# Patient Record
Sex: Female | Born: 1935 | Race: White | Hispanic: No | Smoking: Never smoker
Health system: Southern US, Community
[De-identification: ages and names within clinical notes are randomized; demographics above are authoritative.]

## PROBLEM LIST (undated history)

## (undated) DIAGNOSIS — C182 Malignant neoplasm of ascending colon: Secondary | ICD-10-CM

## (undated) DIAGNOSIS — I10 Essential (primary) hypertension: Secondary | ICD-10-CM

## (undated) DIAGNOSIS — Z853 Personal history of malignant neoplasm of breast: Secondary | ICD-10-CM

## (undated) DIAGNOSIS — E039 Hypothyroidism, unspecified: Secondary | ICD-10-CM

## (undated) HISTORY — PX: RIGHT COLECTOMY: SHX853

## (undated) HISTORY — PX: BREAST EXCISIONAL BIOPSY: SUR124

## (undated) HISTORY — PX: MASTECTOMY: SHX3

---

## 2011-04-12 DIAGNOSIS — L03039 Cellulitis of unspecified toe: Secondary | ICD-10-CM | POA: Diagnosis not present

## 2011-06-28 DIAGNOSIS — E039 Hypothyroidism, unspecified: Secondary | ICD-10-CM | POA: Diagnosis not present

## 2011-06-28 DIAGNOSIS — E7800 Pure hypercholesterolemia, unspecified: Secondary | ICD-10-CM | POA: Diagnosis not present

## 2011-06-28 DIAGNOSIS — Z79899 Other long term (current) drug therapy: Secondary | ICD-10-CM | POA: Diagnosis not present

## 2011-07-05 DIAGNOSIS — E7800 Pure hypercholesterolemia, unspecified: Secondary | ICD-10-CM | POA: Diagnosis not present

## 2011-07-05 DIAGNOSIS — Z0000 Encounter for general adult medical examination without abnormal findings: Secondary | ICD-10-CM | POA: Diagnosis not present

## 2011-07-05 DIAGNOSIS — E039 Hypothyroidism, unspecified: Secondary | ICD-10-CM | POA: Diagnosis not present

## 2011-08-15 DIAGNOSIS — H353190 Nonexudative age-related macular degeneration, unspecified eye, stage unspecified: Secondary | ICD-10-CM | POA: Diagnosis not present

## 2011-10-12 DIAGNOSIS — Z1231 Encounter for screening mammogram for malignant neoplasm of breast: Secondary | ICD-10-CM | POA: Diagnosis not present

## 2011-10-12 DIAGNOSIS — Z9010 Acquired absence of unspecified breast and nipple: Secondary | ICD-10-CM | POA: Diagnosis not present

## 2011-10-15 DIAGNOSIS — M719 Bursopathy, unspecified: Secondary | ICD-10-CM | POA: Diagnosis not present

## 2012-01-08 DIAGNOSIS — E039 Hypothyroidism, unspecified: Secondary | ICD-10-CM | POA: Diagnosis not present

## 2012-01-08 DIAGNOSIS — E7800 Pure hypercholesterolemia, unspecified: Secondary | ICD-10-CM | POA: Diagnosis not present

## 2012-01-08 DIAGNOSIS — Z23 Encounter for immunization: Secondary | ICD-10-CM | POA: Diagnosis not present

## 2012-02-15 DIAGNOSIS — E039 Hypothyroidism, unspecified: Secondary | ICD-10-CM | POA: Diagnosis not present

## 2012-02-15 DIAGNOSIS — I10 Essential (primary) hypertension: Secondary | ICD-10-CM | POA: Diagnosis not present

## 2012-02-15 DIAGNOSIS — Z79899 Other long term (current) drug therapy: Secondary | ICD-10-CM | POA: Diagnosis not present

## 2012-02-15 DIAGNOSIS — E785 Hyperlipidemia, unspecified: Secondary | ICD-10-CM | POA: Diagnosis not present

## 2012-10-07 DIAGNOSIS — E7800 Pure hypercholesterolemia, unspecified: Secondary | ICD-10-CM | POA: Insufficient documentation

## 2012-10-09 DIAGNOSIS — E559 Vitamin D deficiency, unspecified: Secondary | ICD-10-CM | POA: Diagnosis not present

## 2012-10-09 DIAGNOSIS — E7800 Pure hypercholesterolemia, unspecified: Secondary | ICD-10-CM | POA: Diagnosis not present

## 2012-10-09 DIAGNOSIS — E039 Hypothyroidism, unspecified: Secondary | ICD-10-CM | POA: Diagnosis not present

## 2012-10-09 DIAGNOSIS — I10 Essential (primary) hypertension: Secondary | ICD-10-CM | POA: Diagnosis not present

## 2012-10-15 DIAGNOSIS — Z1231 Encounter for screening mammogram for malignant neoplasm of breast: Secondary | ICD-10-CM | POA: Diagnosis not present

## 2012-10-16 DIAGNOSIS — Z23 Encounter for immunization: Secondary | ICD-10-CM | POA: Diagnosis not present

## 2012-10-16 DIAGNOSIS — E7800 Pure hypercholesterolemia, unspecified: Secondary | ICD-10-CM | POA: Diagnosis not present

## 2012-10-16 DIAGNOSIS — I10 Essential (primary) hypertension: Secondary | ICD-10-CM | POA: Diagnosis not present

## 2012-10-16 DIAGNOSIS — E559 Vitamin D deficiency, unspecified: Secondary | ICD-10-CM | POA: Diagnosis not present

## 2012-10-16 DIAGNOSIS — E538 Deficiency of other specified B group vitamins: Secondary | ICD-10-CM | POA: Diagnosis not present

## 2012-10-30 ENCOUNTER — Encounter (INDEPENDENT_AMBULATORY_CARE_PROVIDER_SITE_OTHER): Payer: Self-pay | Admitting: *Deleted

## 2012-11-12 ENCOUNTER — Telehealth (INDEPENDENT_AMBULATORY_CARE_PROVIDER_SITE_OTHER): Payer: Self-pay | Admitting: *Deleted

## 2012-11-12 ENCOUNTER — Encounter (INDEPENDENT_AMBULATORY_CARE_PROVIDER_SITE_OTHER): Payer: Self-pay | Admitting: *Deleted

## 2012-11-12 ENCOUNTER — Other Ambulatory Visit (INDEPENDENT_AMBULATORY_CARE_PROVIDER_SITE_OTHER): Payer: Self-pay | Admitting: *Deleted

## 2012-11-12 DIAGNOSIS — Z85038 Personal history of other malignant neoplasm of large intestine: Secondary | ICD-10-CM

## 2012-11-12 DIAGNOSIS — Z1211 Encounter for screening for malignant neoplasm of colon: Secondary | ICD-10-CM

## 2012-11-12 NOTE — Telephone Encounter (Signed)
Patient needs movi prep 

## 2012-11-13 ENCOUNTER — Telehealth (INDEPENDENT_AMBULATORY_CARE_PROVIDER_SITE_OTHER): Payer: Self-pay | Admitting: *Deleted

## 2012-11-13 ENCOUNTER — Encounter (INDEPENDENT_AMBULATORY_CARE_PROVIDER_SITE_OTHER): Payer: Self-pay

## 2012-11-13 NOTE — Telephone Encounter (Signed)
  Procedure: tcs  Reason/Indication:  Hx colon ca  Ascending colon removed  Has patient had this procedure before?  Yes, 2009 (scanned)  If so, when, by whom and where?    Is there a family history of colon cancer?  no  Who?  What age when diagnosed?    Is patient diabetic?   no      Does patient have prosthetic heart valve?  no  Do you have a pacemaker?  no  Has patient ever had endocarditis? no  Has patient had joint replacement within last 12 months?  no  Is patient on Coumadin, Plavix and/or Aspirin? no  Medications: synthroid 88 mcg daily, lisinopril 12.5 mg daily, preser-vision daily, vit b 12 daily, vit d3 bid  Allergies: nkda  Medication Adjustment:   Procedure date & time: 12/11/12 at 1030

## 2012-11-13 NOTE — Telephone Encounter (Signed)
Need to know if this patient is allergic to anything.   FYI: I talked with a pharmacist yesterday. He said if a patient is allergic to sulfa, they should not be drinking Moviprep. They can have a serious reaction.

## 2012-11-13 NOTE — Telephone Encounter (Signed)
agree

## 2012-11-14 MED ORDER — PEG-KCL-NACL-NASULF-NA ASC-C 100 G PO SOLR: 1.0000 | Freq: Once | ORAL | Status: DC

## 2012-11-14 NOTE — Telephone Encounter (Signed)
NKDA

## 2012-11-28 ENCOUNTER — Encounter (HOSPITAL_COMMUNITY): Payer: Self-pay | Admitting: Pharmacy Technician

## 2012-11-28 DIAGNOSIS — H353190 Nonexudative age-related macular degeneration, unspecified eye, stage unspecified: Secondary | ICD-10-CM | POA: Diagnosis not present

## 2012-12-11 ENCOUNTER — Encounter (HOSPITAL_COMMUNITY): Payer: Self-pay | Admitting: *Deleted

## 2012-12-11 ENCOUNTER — Ambulatory Visit (HOSPITAL_COMMUNITY)
Admission: RE | Admit: 2012-12-11 | Discharge: 2012-12-11 | Disposition: A | Discharge status: Home / Self Care | Payer: Medicare Other | Source: Ambulatory Visit | Attending: Internal Medicine | Admitting: Internal Medicine

## 2012-12-11 ENCOUNTER — Encounter (HOSPITAL_COMMUNITY)
Admission: RE | Disposition: A | Discharge status: Home / Self Care | Payer: Self-pay | Source: Ambulatory Visit | Attending: Internal Medicine

## 2012-12-11 DIAGNOSIS — Z9049 Acquired absence of other specified parts of digestive tract: Secondary | ICD-10-CM

## 2012-12-11 DIAGNOSIS — D126 Benign neoplasm of colon, unspecified: Secondary | ICD-10-CM | POA: Diagnosis not present

## 2012-12-11 DIAGNOSIS — K644 Residual hemorrhoidal skin tags: Secondary | ICD-10-CM | POA: Insufficient documentation

## 2012-12-11 DIAGNOSIS — Z85038 Personal history of other malignant neoplasm of large intestine: Secondary | ICD-10-CM | POA: Diagnosis not present

## 2012-12-11 DIAGNOSIS — K5730 Diverticulosis of large intestine without perforation or abscess without bleeding: Secondary | ICD-10-CM | POA: Insufficient documentation

## 2012-12-11 DIAGNOSIS — I10 Essential (primary) hypertension: Secondary | ICD-10-CM | POA: Insufficient documentation

## 2012-12-11 HISTORY — DX: Essential (primary) hypertension: I10

## 2012-12-11 HISTORY — PX: COLONOSCOPY: SHX5424

## 2012-12-11 HISTORY — DX: Personal history of malignant neoplasm of breast: Z85.3

## 2012-12-11 HISTORY — DX: Hypothyroidism, unspecified: E03.9

## 2012-12-11 HISTORY — DX: Malignant neoplasm of ascending colon: C18.2

## 2012-12-11 SURGERY — COLONOSCOPY
Anesthesia: Moderate Sedation

## 2012-12-11 MED ORDER — SODIUM CHLORIDE 0.9 % IV SOLN
INTRAVENOUS | Status: DC
  Administered 2012-12-11: 10:00:00 via INTRAVENOUS

## 2012-12-11 MED ORDER — MEPERIDINE HCL 50 MG/ML IJ SOLN
INTRAMUSCULAR | Status: AC
  Filled 2012-12-11: qty 1

## 2012-12-11 MED ORDER — MEPERIDINE HCL 50 MG/ML IJ SOLN
INTRAMUSCULAR | Status: DC | PRN
  Administered 2012-12-11 (×2): 25 mg via INTRAVENOUS

## 2012-12-11 MED ORDER — MIDAZOLAM HCL 5 MG/5ML IJ SOLN
INTRAMUSCULAR | Status: DC | PRN
  Administered 2012-12-11: 2 mg via INTRAVENOUS
  Administered 2012-12-11 (×3): 1 mg via INTRAVENOUS

## 2012-12-11 MED ORDER — MIDAZOLAM HCL 5 MG/5ML IJ SOLN
INTRAMUSCULAR | Status: AC
  Filled 2012-12-11: qty 10

## 2012-12-11 MED ORDER — STERILE WATER FOR IRRIGATION IR SOLN
Status: DC | PRN
  Administered 2012-12-11: 10:00:00

## 2012-12-11 NOTE — Op Note (Signed)
COLONOSCOPY PROCEDURE REPORT  PATIENT:  Wanda Cohen  MR#:  [id number] Birthdate:  [birth year], 78 y.o., female Endoscopist:  Dr. Malissa Hippo, MD Referred By:  Dr. Selinda Flavin, MD Procedure Date: 12/11/2012  Procedure:   Colonoscopy  Indications:  Patient is 78 year old Caucasian female with history of colon carcinoma. She had right hemicolectomy in May 2006. Last colonoscopy was in August 2009 was incomplete and followed by barium enema(Dr. Cleotis Nipper).  Informed Consent:  The procedure and risks were reviewed with the patient and informed consent was obtained.  Medications:  Demerol 50 mg IV Versed 5 mg IV  Description of procedure:  After a digital rectal exam was performed, that colonoscope was advanced from the anus through the rectum and colon to the area of hepatic flexure were ileocolonic anastomosis identified. These structures were well-seen and photographed for the record. From the level of anastomosis, the scope was slowly and cautiously withdrawn. The mucosal surfaces were carefully surveyed utilizing scope tip to flexion to facilitate fold flattening as needed. The scope was pulled down into the rectum where a thorough exam including retroflexion was performed.  Findings:  Prep satisfactory. Normal mucosa of distal small bowel. Wide-open ileocolonic anastomosis with single diverticulum. Small polyp ablated via cold biopsy. This polyp was located distal to anastomosis. Scattered diverticula at sigmoid colon which was quite tortuous. Normal rectal mucosa. 2 small anal papillae and hemorrhoids below the dentate line.    Therapeutic/Diagnostic Maneuvers Performed:  See above   Complications:  None  Colon Withdrawal Time: 14 minutes  Impression:  Normal mucosa of distal small bowel. Patent ileocolonic anastomosis located in the region of hepatic flexure. Small polyp ablated via cold biopsy. This polyp was located distal to the anastomosis. Sigmoid colon  diverticulosis. Small external hemorrhoids and 2 anal papillae.  Recommendations:  Standard instructions given. I will contact patient with biopsy results and further recommendations.  Graceanna Theissen U  12/11/2012 11:02 AM  CC: Dr. Selinda Flavin, MD & Dr. Bonnetta Barry ref. provider found

## 2012-12-11 NOTE — H&P (Signed)
Wanda Cohen is an 78 y.o. female.   Chief Complaint: Patient is  here for colonoscopy. HPI: Patient is 78 year old Caucasian female with history of colon carcinoma. She had right hemicolectomy in May 2006. She has remained in remission. Her last colonoscopy by Dr. Cleotis Nipper in August 2008 was incomplete because he was unable to find the lumen. She then had barium enema which was unremarkable. He denies abdominal pain or rectal bleeding. Personal history is also significant for breast carcinoma and she remains in remission. Number history is negative for CRC.  Past Medical History  Diagnosis Date  . Hypothyroidism   . Hypertension   . Colon cancer, ascending     hx of  . Hx of breast cancer     Past Surgical History  Procedure Laterality Date  . Right colectomy    . Mastectomy Left     Family History  Problem Relation Age of Onset  . Colon cancer Neg Hx    Social History:  reports that she has never smoked. She does not have any smokeless tobacco history on file. She reports that she does not drink alcohol or use illicit drugs.  Allergies: No Known Allergies  Medications Prior to Admission  Medication Sig Dispense Refill  . cholecalciferol (VITAMIN D) 1000 UNITS tablet Take 1,000 Units by mouth daily.      Marland Kitchen levothyroxine (SYNTHROID, LEVOTHROID) 88 MCG tablet Take 88 mcg by mouth daily before breakfast.      . lisinopril-hydrochlorothiazide (PRINZIDE,ZESTORETIC) 10-12.5 MG per tablet Take 1 tablet by mouth daily.      . Multiple Vitamins-Minerals (PRESERVISION AREDS PO) Take 1 capsule by mouth daily.      . peg 3350 powder (MOVIPREP) 100 G SOLR Take 1 kit (200 g total) by mouth once.  1 kit  0  . vitamin B-12 (CYANOCOBALAMIN) 1000 MCG tablet Take 1,000 mcg by mouth 2 (two) times daily.        No results found for this or any previous visit (from the past 48 hour(s)). No results found.  ROS  Blood pressure 169/80, temperature 98.2 F (76.8 C), temperature source Oral,  resp. rate 22, height 5' 4.5" (1.638 m), weight 150 lb (68.04 kg), SpO2 97.00%. Physical Exam  Constitutional: She appears well-developed and well-nourished.  HENT:  Mouth/Throat: Oropharynx is clear and moist.  Eyes: Conjunctivae are normal. No scleral icterus.  Neck: No thyromegaly present.  Cardiovascular: Normal rate, regular rhythm and normal heart sounds.   No murmur heard. Respiratory: Effort normal and breath sounds normal.  GI: Soft. She exhibits no distension and no mass. There is no tenderness.  Musculoskeletal: She exhibits no edema.  Lymphadenopathy:    She has no cervical adenopathy.  Neurological: She is alert.  Skin: Skin is warm and dry.     Assessment/Plan History of colon carcinoma. Surveillance colonoscopy.  [name] U 12/11/2012, 10:22 AM

## 2012-12-15 ENCOUNTER — Encounter (HOSPITAL_COMMUNITY): Payer: Self-pay | Admitting: Internal Medicine

## 2012-12-30 ENCOUNTER — Encounter (INDEPENDENT_AMBULATORY_CARE_PROVIDER_SITE_OTHER): Payer: Self-pay | Admitting: *Deleted

## 2013-04-21 DIAGNOSIS — I10 Essential (primary) hypertension: Secondary | ICD-10-CM | POA: Diagnosis not present

## 2013-04-21 DIAGNOSIS — E559 Vitamin D deficiency, unspecified: Secondary | ICD-10-CM | POA: Diagnosis not present

## 2013-04-21 DIAGNOSIS — E039 Hypothyroidism, unspecified: Secondary | ICD-10-CM | POA: Diagnosis not present

## 2013-04-21 DIAGNOSIS — E538 Deficiency of other specified B group vitamins: Secondary | ICD-10-CM | POA: Diagnosis not present

## 2013-10-14 DIAGNOSIS — E559 Vitamin D deficiency, unspecified: Secondary | ICD-10-CM | POA: Diagnosis not present

## 2013-10-14 DIAGNOSIS — I10 Essential (primary) hypertension: Secondary | ICD-10-CM | POA: Diagnosis not present

## 2013-10-14 DIAGNOSIS — E7800 Pure hypercholesterolemia, unspecified: Secondary | ICD-10-CM | POA: Diagnosis not present

## 2013-10-14 DIAGNOSIS — E538 Deficiency of other specified B group vitamins: Secondary | ICD-10-CM | POA: Diagnosis not present

## 2013-10-14 DIAGNOSIS — E039 Hypothyroidism, unspecified: Secondary | ICD-10-CM | POA: Diagnosis not present

## 2013-10-19 DIAGNOSIS — Z1231 Encounter for screening mammogram for malignant neoplasm of breast: Secondary | ICD-10-CM | POA: Diagnosis not present

## 2013-10-21 DIAGNOSIS — E559 Vitamin D deficiency, unspecified: Secondary | ICD-10-CM | POA: Diagnosis not present

## 2013-10-21 DIAGNOSIS — I10 Essential (primary) hypertension: Secondary | ICD-10-CM | POA: Diagnosis not present

## 2013-10-21 DIAGNOSIS — E538 Deficiency of other specified B group vitamins: Secondary | ICD-10-CM | POA: Diagnosis not present

## 2013-10-21 DIAGNOSIS — E039 Hypothyroidism, unspecified: Secondary | ICD-10-CM | POA: Diagnosis not present

## 2013-10-21 DIAGNOSIS — E7800 Pure hypercholesterolemia, unspecified: Secondary | ICD-10-CM | POA: Diagnosis not present

## 2014-04-15 DIAGNOSIS — E780 Pure hypercholesterolemia: Secondary | ICD-10-CM | POA: Diagnosis not present

## 2014-04-15 DIAGNOSIS — E039 Hypothyroidism, unspecified: Secondary | ICD-10-CM | POA: Diagnosis not present

## 2014-04-15 DIAGNOSIS — I10 Essential (primary) hypertension: Secondary | ICD-10-CM | POA: Diagnosis not present

## 2014-04-15 DIAGNOSIS — E559 Vitamin D deficiency, unspecified: Secondary | ICD-10-CM | POA: Diagnosis not present

## 2014-04-15 DIAGNOSIS — D519 Vitamin B12 deficiency anemia, unspecified: Secondary | ICD-10-CM | POA: Diagnosis not present

## 2014-04-22 DIAGNOSIS — E559 Vitamin D deficiency, unspecified: Secondary | ICD-10-CM | POA: Diagnosis not present

## 2014-04-22 DIAGNOSIS — Z1389 Encounter for screening for other disorder: Secondary | ICD-10-CM | POA: Diagnosis not present

## 2014-04-22 DIAGNOSIS — I10 Essential (primary) hypertension: Secondary | ICD-10-CM | POA: Diagnosis not present

## 2014-04-22 DIAGNOSIS — Z0001 Encounter for general adult medical examination with abnormal findings: Secondary | ICD-10-CM | POA: Diagnosis not present

## 2014-04-29 DIAGNOSIS — M4806 Spinal stenosis, lumbar region: Secondary | ICD-10-CM | POA: Diagnosis not present

## 2014-04-29 DIAGNOSIS — M1990 Unspecified osteoarthritis, unspecified site: Secondary | ICD-10-CM | POA: Diagnosis not present

## 2014-04-29 DIAGNOSIS — M5116 Intervertebral disc disorders with radiculopathy, lumbar region: Secondary | ICD-10-CM | POA: Diagnosis not present

## 2014-04-29 DIAGNOSIS — M545 Low back pain: Secondary | ICD-10-CM | POA: Diagnosis not present

## 2014-04-29 DIAGNOSIS — Z85038 Personal history of other malignant neoplasm of large intestine: Secondary | ICD-10-CM | POA: Diagnosis not present

## 2014-04-29 DIAGNOSIS — Z780 Asymptomatic menopausal state: Secondary | ICD-10-CM | POA: Diagnosis not present

## 2014-04-29 DIAGNOSIS — Z853 Personal history of malignant neoplasm of breast: Secondary | ICD-10-CM | POA: Diagnosis not present

## 2014-04-29 DIAGNOSIS — M85852 Other specified disorders of bone density and structure, left thigh: Secondary | ICD-10-CM | POA: Diagnosis not present

## 2014-04-29 DIAGNOSIS — M8589 Other specified disorders of bone density and structure, multiple sites: Secondary | ICD-10-CM | POA: Diagnosis not present

## 2014-04-29 DIAGNOSIS — M479 Spondylosis, unspecified: Secondary | ICD-10-CM | POA: Diagnosis not present

## 2014-04-29 DIAGNOSIS — M85851 Other specified disorders of bone density and structure, right thigh: Secondary | ICD-10-CM | POA: Diagnosis not present

## 2014-04-29 DIAGNOSIS — M810 Age-related osteoporosis without current pathological fracture: Secondary | ICD-10-CM | POA: Diagnosis not present

## 2014-04-29 DIAGNOSIS — Z9221 Personal history of antineoplastic chemotherapy: Secondary | ICD-10-CM | POA: Diagnosis not present

## 2014-04-29 DIAGNOSIS — I10 Essential (primary) hypertension: Secondary | ICD-10-CM | POA: Diagnosis not present

## 2014-08-12 DIAGNOSIS — M47816 Spondylosis without myelopathy or radiculopathy, lumbar region: Secondary | ICD-10-CM | POA: Diagnosis not present

## 2014-08-12 DIAGNOSIS — M4317 Spondylolisthesis, lumbosacral region: Secondary | ICD-10-CM | POA: Diagnosis not present

## 2014-08-12 DIAGNOSIS — M545 Low back pain: Secondary | ICD-10-CM | POA: Diagnosis not present

## 2014-08-12 DIAGNOSIS — M4806 Spinal stenosis, lumbar region: Secondary | ICD-10-CM | POA: Diagnosis not present

## 2014-08-17 DIAGNOSIS — M47816 Spondylosis without myelopathy or radiculopathy, lumbar region: Secondary | ICD-10-CM | POA: Diagnosis not present

## 2014-08-20 DIAGNOSIS — M47816 Spondylosis without myelopathy or radiculopathy, lumbar region: Secondary | ICD-10-CM | POA: Diagnosis not present

## 2014-08-23 DIAGNOSIS — M47816 Spondylosis without myelopathy or radiculopathy, lumbar region: Secondary | ICD-10-CM | POA: Diagnosis not present

## 2014-08-25 DIAGNOSIS — M47816 Spondylosis without myelopathy or radiculopathy, lumbar region: Secondary | ICD-10-CM | POA: Diagnosis not present

## 2014-08-26 DIAGNOSIS — M47816 Spondylosis without myelopathy or radiculopathy, lumbar region: Secondary | ICD-10-CM | POA: Diagnosis not present

## 2014-08-31 DIAGNOSIS — M47816 Spondylosis without myelopathy or radiculopathy, lumbar region: Secondary | ICD-10-CM | POA: Diagnosis not present

## 2014-09-02 DIAGNOSIS — M47816 Spondylosis without myelopathy or radiculopathy, lumbar region: Secondary | ICD-10-CM | POA: Diagnosis not present

## 2014-09-03 DIAGNOSIS — M47816 Spondylosis without myelopathy or radiculopathy, lumbar region: Secondary | ICD-10-CM | POA: Diagnosis not present

## 2014-09-06 DIAGNOSIS — M47816 Spondylosis without myelopathy or radiculopathy, lumbar region: Secondary | ICD-10-CM | POA: Diagnosis not present

## 2014-09-08 DIAGNOSIS — M47816 Spondylosis without myelopathy or radiculopathy, lumbar region: Secondary | ICD-10-CM | POA: Diagnosis not present

## 2014-09-09 DIAGNOSIS — M47816 Spondylosis without myelopathy or radiculopathy, lumbar region: Secondary | ICD-10-CM | POA: Diagnosis not present

## 2014-09-14 DIAGNOSIS — M47816 Spondylosis without myelopathy or radiculopathy, lumbar region: Secondary | ICD-10-CM | POA: Diagnosis not present

## 2014-09-16 DIAGNOSIS — M47816 Spondylosis without myelopathy or radiculopathy, lumbar region: Secondary | ICD-10-CM | POA: Diagnosis not present

## 2014-09-20 DIAGNOSIS — M47816 Spondylosis without myelopathy or radiculopathy, lumbar region: Secondary | ICD-10-CM | POA: Diagnosis not present

## 2014-09-22 DIAGNOSIS — M47816 Spondylosis without myelopathy or radiculopathy, lumbar region: Secondary | ICD-10-CM | POA: Diagnosis not present

## 2014-09-23 DIAGNOSIS — M47816 Spondylosis without myelopathy or radiculopathy, lumbar region: Secondary | ICD-10-CM | POA: Diagnosis not present

## 2014-09-27 DIAGNOSIS — M47816 Spondylosis without myelopathy or radiculopathy, lumbar region: Secondary | ICD-10-CM | POA: Diagnosis not present

## 2014-09-30 DIAGNOSIS — M47816 Spondylosis without myelopathy or radiculopathy, lumbar region: Secondary | ICD-10-CM | POA: Diagnosis not present

## 2014-10-01 DIAGNOSIS — M47816 Spondylosis without myelopathy or radiculopathy, lumbar region: Secondary | ICD-10-CM | POA: Diagnosis not present

## 2014-10-05 DIAGNOSIS — M47816 Spondylosis without myelopathy or radiculopathy, lumbar region: Secondary | ICD-10-CM | POA: Diagnosis not present

## 2014-10-08 DIAGNOSIS — M47816 Spondylosis without myelopathy or radiculopathy, lumbar region: Secondary | ICD-10-CM | POA: Diagnosis not present

## 2014-10-14 DIAGNOSIS — M4806 Spinal stenosis, lumbar region: Secondary | ICD-10-CM | POA: Diagnosis not present

## 2014-10-14 DIAGNOSIS — M4317 Spondylolisthesis, lumbosacral region: Secondary | ICD-10-CM | POA: Diagnosis not present

## 2014-10-14 DIAGNOSIS — M47816 Spondylosis without myelopathy or radiculopathy, lumbar region: Secondary | ICD-10-CM | POA: Diagnosis not present

## 2014-10-15 DIAGNOSIS — E559 Vitamin D deficiency, unspecified: Secondary | ICD-10-CM | POA: Diagnosis not present

## 2014-10-15 DIAGNOSIS — E039 Hypothyroidism, unspecified: Secondary | ICD-10-CM | POA: Diagnosis not present

## 2014-10-15 DIAGNOSIS — I10 Essential (primary) hypertension: Secondary | ICD-10-CM | POA: Diagnosis not present

## 2014-10-22 DIAGNOSIS — E559 Vitamin D deficiency, unspecified: Secondary | ICD-10-CM | POA: Diagnosis not present

## 2014-10-22 DIAGNOSIS — E039 Hypothyroidism, unspecified: Secondary | ICD-10-CM | POA: Diagnosis not present

## 2014-10-22 DIAGNOSIS — I10 Essential (primary) hypertension: Secondary | ICD-10-CM | POA: Diagnosis not present

## 2014-10-22 DIAGNOSIS — E780 Pure hypercholesterolemia: Secondary | ICD-10-CM | POA: Diagnosis not present

## 2014-10-28 DIAGNOSIS — Z1231 Encounter for screening mammogram for malignant neoplasm of breast: Secondary | ICD-10-CM | POA: Diagnosis not present

## 2015-01-17 DIAGNOSIS — M50321 Other cervical disc degeneration at C4-C5 level: Secondary | ICD-10-CM | POA: Diagnosis not present

## 2015-01-17 DIAGNOSIS — M5412 Radiculopathy, cervical region: Secondary | ICD-10-CM | POA: Diagnosis not present

## 2015-01-17 DIAGNOSIS — M542 Cervicalgia: Secondary | ICD-10-CM | POA: Diagnosis not present

## 2015-01-17 DIAGNOSIS — M4806 Spinal stenosis, lumbar region: Secondary | ICD-10-CM | POA: Diagnosis not present

## 2015-01-17 DIAGNOSIS — M50322 Other cervical disc degeneration at C5-C6 level: Secondary | ICD-10-CM | POA: Diagnosis not present

## 2015-01-26 DIAGNOSIS — M4802 Spinal stenosis, cervical region: Secondary | ICD-10-CM | POA: Diagnosis not present

## 2015-01-26 DIAGNOSIS — M5032 Other cervical disc degeneration, mid-cervical region, unspecified level: Secondary | ICD-10-CM | POA: Diagnosis not present

## 2015-01-26 DIAGNOSIS — M4722 Other spondylosis with radiculopathy, cervical region: Secondary | ICD-10-CM | POA: Diagnosis not present

## 2015-01-26 DIAGNOSIS — M5033 Other cervical disc degeneration, cervicothoracic region: Secondary | ICD-10-CM | POA: Diagnosis not present

## 2015-03-22 DIAGNOSIS — M4722 Other spondylosis with radiculopathy, cervical region: Secondary | ICD-10-CM | POA: Diagnosis not present

## 2015-03-22 DIAGNOSIS — M9981 Other biomechanical lesions of cervical region: Secondary | ICD-10-CM | POA: Diagnosis not present

## 2015-03-22 DIAGNOSIS — M47812 Spondylosis without myelopathy or radiculopathy, cervical region: Secondary | ICD-10-CM | POA: Diagnosis not present

## 2015-03-22 DIAGNOSIS — M5030 Other cervical disc degeneration, unspecified cervical region: Secondary | ICD-10-CM | POA: Diagnosis not present

## 2015-03-24 DIAGNOSIS — M47812 Spondylosis without myelopathy or radiculopathy, cervical region: Secondary | ICD-10-CM | POA: Diagnosis not present

## 2015-03-24 DIAGNOSIS — M4802 Spinal stenosis, cervical region: Secondary | ICD-10-CM | POA: Diagnosis not present

## 2015-03-24 DIAGNOSIS — E039 Hypothyroidism, unspecified: Secondary | ICD-10-CM | POA: Diagnosis not present

## 2015-03-24 DIAGNOSIS — Z79899 Other long term (current) drug therapy: Secondary | ICD-10-CM | POA: Diagnosis not present

## 2015-03-24 DIAGNOSIS — M5030 Other cervical disc degeneration, unspecified cervical region: Secondary | ICD-10-CM | POA: Diagnosis not present

## 2015-03-24 DIAGNOSIS — Z85038 Personal history of other malignant neoplasm of large intestine: Secondary | ICD-10-CM | POA: Diagnosis not present

## 2015-03-24 DIAGNOSIS — Z853 Personal history of malignant neoplasm of breast: Secondary | ICD-10-CM | POA: Diagnosis not present

## 2015-03-24 DIAGNOSIS — Z9012 Acquired absence of left breast and nipple: Secondary | ICD-10-CM | POA: Diagnosis not present

## 2015-03-24 DIAGNOSIS — I10 Essential (primary) hypertension: Secondary | ICD-10-CM | POA: Diagnosis not present

## 2015-04-14 DIAGNOSIS — D519 Vitamin B12 deficiency anemia, unspecified: Secondary | ICD-10-CM | POA: Diagnosis not present

## 2015-04-14 DIAGNOSIS — E7800 Pure hypercholesterolemia, unspecified: Secondary | ICD-10-CM | POA: Diagnosis not present

## 2015-04-14 DIAGNOSIS — E039 Hypothyroidism, unspecified: Secondary | ICD-10-CM | POA: Diagnosis not present

## 2015-04-14 DIAGNOSIS — E559 Vitamin D deficiency, unspecified: Secondary | ICD-10-CM | POA: Diagnosis not present

## 2015-04-14 DIAGNOSIS — I10 Essential (primary) hypertension: Secondary | ICD-10-CM | POA: Diagnosis not present

## 2015-04-19 DIAGNOSIS — R51 Headache: Secondary | ICD-10-CM | POA: Diagnosis not present

## 2015-04-19 DIAGNOSIS — B029 Zoster without complications: Secondary | ICD-10-CM | POA: Diagnosis not present

## 2015-04-20 DIAGNOSIS — M5030 Other cervical disc degeneration, unspecified cervical region: Secondary | ICD-10-CM | POA: Diagnosis not present

## 2015-04-20 DIAGNOSIS — M47812 Spondylosis without myelopathy or radiculopathy, cervical region: Secondary | ICD-10-CM | POA: Diagnosis not present

## 2015-04-27 DIAGNOSIS — E039 Hypothyroidism, unspecified: Secondary | ICD-10-CM | POA: Diagnosis not present

## 2015-04-27 DIAGNOSIS — E559 Vitamin D deficiency, unspecified: Secondary | ICD-10-CM | POA: Diagnosis not present

## 2015-04-27 DIAGNOSIS — B029 Zoster without complications: Secondary | ICD-10-CM | POA: Diagnosis not present

## 2015-04-27 DIAGNOSIS — I10 Essential (primary) hypertension: Secondary | ICD-10-CM | POA: Diagnosis not present

## 2015-04-27 DIAGNOSIS — B0221 Postherpetic geniculate ganglionitis: Secondary | ICD-10-CM | POA: Diagnosis not present

## 2015-04-27 DIAGNOSIS — Z0001 Encounter for general adult medical examination with abnormal findings: Secondary | ICD-10-CM | POA: Diagnosis not present

## 2015-04-27 DIAGNOSIS — N182 Chronic kidney disease, stage 2 (mild): Secondary | ICD-10-CM | POA: Diagnosis not present

## 2015-05-05 DIAGNOSIS — H903 Sensorineural hearing loss, bilateral: Secondary | ICD-10-CM | POA: Diagnosis not present

## 2015-05-05 DIAGNOSIS — H6121 Impacted cerumen, right ear: Secondary | ICD-10-CM | POA: Diagnosis not present

## 2015-05-05 DIAGNOSIS — G518 Other disorders of facial nerve: Secondary | ICD-10-CM | POA: Diagnosis not present

## 2015-07-05 DIAGNOSIS — H353110 Nonexudative age-related macular degeneration, right eye, stage unspecified: Secondary | ICD-10-CM | POA: Diagnosis not present

## 2015-10-31 DIAGNOSIS — Z9012 Acquired absence of left breast and nipple: Secondary | ICD-10-CM | POA: Diagnosis not present

## 2015-10-31 DIAGNOSIS — Z1231 Encounter for screening mammogram for malignant neoplasm of breast: Secondary | ICD-10-CM | POA: Diagnosis not present

## 2016-01-12 DIAGNOSIS — Z6824 Body mass index (BMI) 24.0-24.9, adult: Secondary | ICD-10-CM | POA: Diagnosis not present

## 2016-01-12 DIAGNOSIS — M25562 Pain in left knee: Secondary | ICD-10-CM | POA: Diagnosis not present

## 2016-01-20 DIAGNOSIS — S93401A Sprain of unspecified ligament of right ankle, initial encounter: Secondary | ICD-10-CM | POA: Diagnosis not present

## 2016-01-20 DIAGNOSIS — M25571 Pain in right ankle and joints of right foot: Secondary | ICD-10-CM | POA: Diagnosis not present

## 2016-01-20 DIAGNOSIS — X501XXA Overexertion from prolonged static or awkward postures, initial encounter: Secondary | ICD-10-CM | POA: Diagnosis not present

## 2016-01-20 DIAGNOSIS — Z79899 Other long term (current) drug therapy: Secondary | ICD-10-CM | POA: Diagnosis not present

## 2016-01-20 DIAGNOSIS — M79671 Pain in right foot: Secondary | ICD-10-CM | POA: Diagnosis not present

## 2016-04-30 DIAGNOSIS — E039 Hypothyroidism, unspecified: Secondary | ICD-10-CM | POA: Diagnosis not present

## 2016-04-30 DIAGNOSIS — E7800 Pure hypercholesterolemia, unspecified: Secondary | ICD-10-CM | POA: Diagnosis not present

## 2016-04-30 DIAGNOSIS — I10 Essential (primary) hypertension: Secondary | ICD-10-CM | POA: Diagnosis not present

## 2016-04-30 DIAGNOSIS — E559 Vitamin D deficiency, unspecified: Secondary | ICD-10-CM | POA: Diagnosis not present

## 2016-04-30 DIAGNOSIS — N182 Chronic kidney disease, stage 2 (mild): Secondary | ICD-10-CM | POA: Diagnosis not present

## 2016-05-01 DIAGNOSIS — N182 Chronic kidney disease, stage 2 (mild): Secondary | ICD-10-CM | POA: Insufficient documentation

## 2016-05-02 DIAGNOSIS — Z853 Personal history of malignant neoplasm of breast: Secondary | ICD-10-CM | POA: Diagnosis not present

## 2016-05-02 DIAGNOSIS — Z0001 Encounter for general adult medical examination with abnormal findings: Secondary | ICD-10-CM | POA: Diagnosis not present

## 2016-05-02 DIAGNOSIS — Z85038 Personal history of other malignant neoplasm of large intestine: Secondary | ICD-10-CM | POA: Diagnosis not present

## 2016-05-02 DIAGNOSIS — N182 Chronic kidney disease, stage 2 (mild): Secondary | ICD-10-CM | POA: Diagnosis not present

## 2016-05-02 DIAGNOSIS — E7800 Pure hypercholesterolemia, unspecified: Secondary | ICD-10-CM | POA: Diagnosis not present

## 2016-05-02 DIAGNOSIS — M25562 Pain in left knee: Secondary | ICD-10-CM | POA: Diagnosis not present

## 2016-05-02 DIAGNOSIS — E039 Hypothyroidism, unspecified: Secondary | ICD-10-CM | POA: Diagnosis not present

## 2016-05-02 DIAGNOSIS — I10 Essential (primary) hypertension: Secondary | ICD-10-CM | POA: Diagnosis not present

## 2016-05-08 DIAGNOSIS — M1712 Unilateral primary osteoarthritis, left knee: Secondary | ICD-10-CM | POA: Diagnosis not present

## 2016-07-18 DIAGNOSIS — E7800 Pure hypercholesterolemia, unspecified: Secondary | ICD-10-CM | POA: Diagnosis not present

## 2016-07-18 DIAGNOSIS — R202 Paresthesia of skin: Secondary | ICD-10-CM | POA: Diagnosis not present

## 2016-07-18 DIAGNOSIS — Z6825 Body mass index (BMI) 25.0-25.9, adult: Secondary | ICD-10-CM | POA: Insufficient documentation

## 2016-07-18 DIAGNOSIS — I10 Essential (primary) hypertension: Secondary | ICD-10-CM | POA: Diagnosis not present

## 2016-07-18 DIAGNOSIS — E039 Hypothyroidism, unspecified: Secondary | ICD-10-CM | POA: Diagnosis not present

## 2016-07-23 DIAGNOSIS — R202 Paresthesia of skin: Secondary | ICD-10-CM | POA: Diagnosis not present

## 2016-07-23 DIAGNOSIS — I6523 Occlusion and stenosis of bilateral carotid arteries: Secondary | ICD-10-CM | POA: Diagnosis not present

## 2016-07-26 DIAGNOSIS — R202 Paresthesia of skin: Secondary | ICD-10-CM | POA: Diagnosis not present

## 2016-07-26 DIAGNOSIS — M6281 Muscle weakness (generalized): Secondary | ICD-10-CM | POA: Diagnosis not present

## 2016-07-27 DIAGNOSIS — I10 Essential (primary) hypertension: Secondary | ICD-10-CM | POA: Diagnosis not present

## 2016-07-27 DIAGNOSIS — R202 Paresthesia of skin: Secondary | ICD-10-CM | POA: Diagnosis not present

## 2016-07-27 DIAGNOSIS — E7800 Pure hypercholesterolemia, unspecified: Secondary | ICD-10-CM | POA: Diagnosis not present

## 2016-07-27 DIAGNOSIS — E039 Hypothyroidism, unspecified: Secondary | ICD-10-CM | POA: Diagnosis not present

## 2016-07-27 DIAGNOSIS — Z6825 Body mass index (BMI) 25.0-25.9, adult: Secondary | ICD-10-CM | POA: Diagnosis not present

## 2016-09-25 DIAGNOSIS — M1712 Unilateral primary osteoarthritis, left knee: Secondary | ICD-10-CM | POA: Diagnosis not present

## 2016-09-26 DIAGNOSIS — H353110 Nonexudative age-related macular degeneration, right eye, stage unspecified: Secondary | ICD-10-CM | POA: Diagnosis not present

## 2016-11-05 DIAGNOSIS — Z1231 Encounter for screening mammogram for malignant neoplasm of breast: Secondary | ICD-10-CM | POA: Diagnosis not present

## 2016-12-11 DIAGNOSIS — M1712 Unilateral primary osteoarthritis, left knee: Secondary | ICD-10-CM | POA: Diagnosis not present

## 2017-05-03 DIAGNOSIS — E7800 Pure hypercholesterolemia, unspecified: Secondary | ICD-10-CM | POA: Diagnosis not present

## 2017-05-03 DIAGNOSIS — Z85038 Personal history of other malignant neoplasm of large intestine: Secondary | ICD-10-CM | POA: Diagnosis not present

## 2017-05-03 DIAGNOSIS — D519 Vitamin B12 deficiency anemia, unspecified: Secondary | ICD-10-CM | POA: Diagnosis not present

## 2017-05-03 DIAGNOSIS — E039 Hypothyroidism, unspecified: Secondary | ICD-10-CM | POA: Diagnosis not present

## 2017-05-03 DIAGNOSIS — E559 Vitamin D deficiency, unspecified: Secondary | ICD-10-CM | POA: Diagnosis not present

## 2017-05-03 DIAGNOSIS — N182 Chronic kidney disease, stage 2 (mild): Secondary | ICD-10-CM | POA: Diagnosis not present

## 2017-05-03 DIAGNOSIS — I10 Essential (primary) hypertension: Secondary | ICD-10-CM | POA: Diagnosis not present

## 2017-05-09 DIAGNOSIS — E039 Hypothyroidism, unspecified: Secondary | ICD-10-CM | POA: Diagnosis not present

## 2017-05-09 DIAGNOSIS — N182 Chronic kidney disease, stage 2 (mild): Secondary | ICD-10-CM | POA: Diagnosis not present

## 2017-05-09 DIAGNOSIS — D519 Vitamin B12 deficiency anemia, unspecified: Secondary | ICD-10-CM | POA: Diagnosis not present

## 2017-05-09 DIAGNOSIS — Z0000 Encounter for general adult medical examination without abnormal findings: Secondary | ICD-10-CM | POA: Diagnosis not present

## 2017-05-09 DIAGNOSIS — Z6823 Body mass index (BMI) 23.0-23.9, adult: Secondary | ICD-10-CM | POA: Diagnosis not present

## 2017-05-09 DIAGNOSIS — Z85038 Personal history of other malignant neoplasm of large intestine: Secondary | ICD-10-CM | POA: Diagnosis not present

## 2017-05-09 DIAGNOSIS — E7800 Pure hypercholesterolemia, unspecified: Secondary | ICD-10-CM | POA: Diagnosis not present

## 2017-05-13 DIAGNOSIS — Z79899 Other long term (current) drug therapy: Secondary | ICD-10-CM | POA: Diagnosis not present

## 2017-05-13 DIAGNOSIS — Z7982 Long term (current) use of aspirin: Secondary | ICD-10-CM | POA: Diagnosis not present

## 2017-05-13 DIAGNOSIS — Z853 Personal history of malignant neoplasm of breast: Secondary | ICD-10-CM | POA: Diagnosis not present

## 2017-05-13 DIAGNOSIS — M810 Age-related osteoporosis without current pathological fracture: Secondary | ICD-10-CM | POA: Diagnosis not present

## 2017-05-13 DIAGNOSIS — Z85038 Personal history of other malignant neoplasm of large intestine: Secondary | ICD-10-CM | POA: Diagnosis not present

## 2017-05-13 DIAGNOSIS — E039 Hypothyroidism, unspecified: Secondary | ICD-10-CM | POA: Diagnosis not present

## 2017-05-13 DIAGNOSIS — Z780 Asymptomatic menopausal state: Secondary | ICD-10-CM | POA: Diagnosis not present

## 2017-05-20 DIAGNOSIS — M810 Age-related osteoporosis without current pathological fracture: Secondary | ICD-10-CM | POA: Diagnosis not present

## 2017-05-20 DIAGNOSIS — Z6824 Body mass index (BMI) 24.0-24.9, adult: Secondary | ICD-10-CM | POA: Diagnosis not present

## 2017-05-20 DIAGNOSIS — Z853 Personal history of malignant neoplasm of breast: Secondary | ICD-10-CM | POA: Diagnosis not present

## 2017-05-21 DIAGNOSIS — D485 Neoplasm of uncertain behavior of skin: Secondary | ICD-10-CM | POA: Diagnosis not present

## 2017-05-21 DIAGNOSIS — L570 Actinic keratosis: Secondary | ICD-10-CM | POA: Diagnosis not present

## 2017-05-23 ENCOUNTER — Telehealth (INDEPENDENT_AMBULATORY_CARE_PROVIDER_SITE_OTHER): Payer: Self-pay | Admitting: *Deleted

## 2017-05-23 NOTE — Telephone Encounter (Signed)
We have denied her new patient appt request.

## 2017-05-23 NOTE — Telephone Encounter (Signed)
Patient stopped by office to sign a release to transfer care to [facility], Dr Gala Romney

## 2017-05-23 NOTE — Telephone Encounter (Signed)
[facility] staff said patient was told to stop by here and sign release to be seen there is reason we sent release.  We explained we didn't have to send records since we are all on same EMR.  She said she was to be given an appointment once the records was released.  We have not had any contact with patient since her TCS 2014 and is due her repeat in 12/2017.

## 2017-05-23 NOTE — Telephone Encounter (Signed)
Per our providers, the new patient appt has been denied.   We never instructed the patient to get records from Dr. Olevia Perches practice.    The patient is already established with a GI doctor.

## 2017-06-27 ENCOUNTER — Ambulatory Visit (INDEPENDENT_AMBULATORY_CARE_PROVIDER_SITE_OTHER): Payer: Medicare Other | Admitting: General Surgery

## 2017-06-27 ENCOUNTER — Encounter: Payer: Self-pay | Admitting: General Surgery

## 2017-06-27 VITALS — BP 177/74 | HR 83 | Temp 98.2°F | Ht 65.0 in | Wt 138.0 lb

## 2017-06-27 DIAGNOSIS — Z85038 Personal history of other malignant neoplasm of large intestine: Secondary | ICD-10-CM | POA: Diagnosis not present

## 2017-06-27 MED ORDER — PEG 3350-KCL-NABCB-NACL-NASULF 236 G PO SOLR: 4000.0000 mL | Freq: Once | ORAL | 0 refills | Status: AC

## 2017-06-27 NOTE — H&P (Signed)
Wanda Cohen; [id number]; 07-12-1934   HPI Patient is an 83 year old white female status post right hemicolectomy in 2009 for colon cancer who now presents for a surveillance colonoscopy.  She did undergo chemotherapy after her original surgery.  She had a colonoscopy in 2014 which only showed a small polyp.  She denies any abdominal pain, fever, diarrhea, blood per rectum, or significant weight loss.  She states that over the past few months she has developed some constipation.  She currently has 0 out of 10 abdominal pain.  As an aside, the patient states she has been adjusting her blood pressure medication at home. Past Medical History:  Diagnosis Date  . Colon cancer, ascending (HCC)    hx of  . Hx of breast cancer   . Hypertension   . Hypothyroidism     Past Surgical History:  Procedure Laterality Date  . COLONOSCOPY N/A 12/11/2012   Procedure: COLONOSCOPY;  Surgeon: Rogene Houston, MD;  Location: [facility];  Service: Endoscopy;  Laterality: N/A;  1030  . MASTECTOMY Left   . RIGHT COLECTOMY      Family History  Problem Relation Age of Onset  . Colon cancer Neg Hx     Current Outpatient Medications on File Prior to Visit  Medication Sig Dispense Refill  . cholecalciferol (VITAMIN D) 1000 UNITS tablet Take 1,000 Units by mouth daily.    Marland Kitchen levothyroxine (SYNTHROID, LEVOTHROID) 88 MCG tablet Take 88 mcg by mouth daily before breakfast.    . lisinopril-hydrochlorothiazide (PRINZIDE,ZESTORETIC) 10-12.5 MG per tablet Take 1 tablet by mouth daily.    . Multiple Vitamins-Minerals (PRESERVISION AREDS PO) Take 1 capsule by mouth daily.    . vitamin B-12 (CYANOCOBALAMIN) 1000 MCG tablet Take 1,000 mcg by mouth 2 (two) times daily.     No current facility-administered medications on file prior to visit.     No Known Allergies  Social History   Substance and Sexual Activity  Alcohol Use No    Social History   Tobacco Use  Smoking Status Never Smoker  Smokeless Tobacco  Never Used    Review of Systems  Constitutional: Negative.   HENT: Negative.   Eyes: Negative.   Cardiovascular: Negative.   Gastrointestinal: Negative.   Genitourinary: Positive for frequency.  Musculoskeletal: Positive for back pain and joint pain.  Skin: Negative.   Neurological: Negative.   Endo/Heme/Allergies: Negative.   Psychiatric/Behavioral: Negative.     Objective   Vitals:   06/27/17 1117  BP: (!) 177/74  Pulse: 83  Temp: 98.2 F (36.8 C)    Physical Exam  Constitutional: She is oriented to person, place, and time and well-developed, well-nourished, and in no distress.  HENT:  Head: Normocephalic and atraumatic.  Cardiovascular: Normal rate, regular rhythm and normal heart sounds. Exam reveals no gallop and no friction rub.  No murmur heard. Pulmonary/Chest: Effort normal and breath sounds normal. No respiratory distress. She has no wheezes. She has no rales.  Abdominal: Soft. Bowel sounds are normal. She exhibits no distension. There is no tenderness. There is no rebound.  Neurological: She is alert and oriented to person, place, and time.  Skin: Skin is warm and dry.  Vitals reviewed.  Colonoscopy report reviewed from 2014.  Assessment  History of colon carcinoma, colon polyp Plan   Patient is scheduled for a colonoscopy on 07/09/2017.  The risks and benefits of the procedure including bleeding and perforation were fully explained to the patient, who gave informed consent.  She will be monitoring  her blood pressure at home.  GoLYTELY prep has been prescribed.

## 2017-06-27 NOTE — Progress Notes (Signed)
Wanda Cohen; [id number]; 1934/12/04   HPI Patient is an 83 year old white female status post right hemicolectomy in 2009 for colon cancer who now presents for a surveillance colonoscopy.  She did undergo chemotherapy after her original surgery.  She had a colonoscopy in 2014 which only showed a small polyp.  She denies any abdominal pain, fever, diarrhea, blood per rectum, or significant weight loss.  She states that over the past few months she has developed some constipation.  She currently has 0 out of 10 abdominal pain.  As an aside, the patient states she has been adjusting her blood pressure medication at home. Past Medical History:  Diagnosis Date  . Colon cancer, ascending (HCC)    hx of  . Hx of breast cancer   . Hypertension   . Hypothyroidism     Past Surgical History:  Procedure Laterality Date  . COLONOSCOPY N/A 12/11/2012   Procedure: COLONOSCOPY;  Surgeon: Rogene Houston, MD;  Location: [facility];  Service: Endoscopy;  Laterality: N/A;  1030  . MASTECTOMY Left   . RIGHT COLECTOMY      Family History  Problem Relation Age of Onset  . Colon cancer Neg Hx     Current Outpatient Medications on File Prior to Visit  Medication Sig Dispense Refill  . cholecalciferol (VITAMIN D) 1000 UNITS tablet Take 1,000 Units by mouth daily.    Marland Kitchen levothyroxine (SYNTHROID, LEVOTHROID) 88 MCG tablet Take 88 mcg by mouth daily before breakfast.    . lisinopril-hydrochlorothiazide (PRINZIDE,ZESTORETIC) 10-12.5 MG per tablet Take 1 tablet by mouth daily.    . Multiple Vitamins-Minerals (PRESERVISION AREDS PO) Take 1 capsule by mouth daily.    . vitamin B-12 (CYANOCOBALAMIN) 1000 MCG tablet Take 1,000 mcg by mouth 2 (two) times daily.     No current facility-administered medications on file prior to visit.     No Known Allergies  Social History   Substance and Sexual Activity  Alcohol Use No    Social History   Tobacco Use  Smoking Status Never Smoker  Smokeless Tobacco  Never Used    Review of Systems  Constitutional: Negative.   HENT: Negative.   Eyes: Negative.   Cardiovascular: Negative.   Gastrointestinal: Negative.   Genitourinary: Positive for frequency.  Musculoskeletal: Positive for back pain and joint pain.  Skin: Negative.   Neurological: Negative.   Endo/Heme/Allergies: Negative.   Psychiatric/Behavioral: Negative.     Objective   Vitals:   06/27/17 1117  BP: (!) 177/74  Pulse: 83  Temp: 98.2 F (36.8 C)    Physical Exam  Constitutional: She is oriented to person, place, and time and well-developed, well-nourished, and in no distress.  HENT:  Head: Normocephalic and atraumatic.  Cardiovascular: Normal rate, regular rhythm and normal heart sounds. Exam reveals no gallop and no friction rub.  No murmur heard. Pulmonary/Chest: Effort normal and breath sounds normal. No respiratory distress. She has no wheezes. She has no rales.  Abdominal: Soft. Bowel sounds are normal. She exhibits no distension. There is no tenderness. There is no rebound.  Neurological: She is alert and oriented to person, place, and time.  Skin: Skin is warm and dry.  Vitals reviewed.  Colonoscopy report reviewed from 2014.  Assessment  History of colon carcinoma, colon polyp Plan   Patient is scheduled for a colonoscopy on 07/09/2017.  The risks and benefits of the procedure including bleeding and perforation were fully explained to the patient, who gave informed consent.  She will be monitoring  her blood pressure at home.  GoLYTELY prep has been prescribed.

## 2017-06-27 NOTE — Patient Instructions (Signed)
Colonoscopy, Adult A colonoscopy is an exam to look at the entire large intestine. During the exam, a lubricated, bendable tube is inserted into the anus and then passed into the rectum, colon, and other parts of the large intestine. A colonoscopy is often done as a part of normal colorectal screening or in response to certain symptoms, such as anemia, persistent diarrhea, abdominal pain, and blood in the stool. The exam can help screen for and diagnose medical problems, including:  Tumors.  Polyps.  Inflammation.  Areas of bleeding.  Tell a health care provider about:  Any allergies you have.  All medicines you are taking, including vitamins, herbs, eye drops, creams, and over-the-counter medicines.  Any problems you or family members have had with anesthetic medicines.  Any blood disorders you have.  Any surgeries you have had.  Any medical conditions you have.  Any problems you have had passing stool. What are the risks? Generally, this is a safe procedure. However, problems may occur, including:  Bleeding.  A tear in the intestine.  A reaction to medicines given during the exam.  Infection (rare).  What happens before the procedure? Eating and drinking restrictions Follow instructions from your health care provider about eating and drinking, which may include:  A few days before the procedure - follow a low-fiber diet. Avoid nuts, seeds, dried fruit, raw fruits, and vegetables.  1-3 days before the procedure - follow a clear liquid diet. Drink only clear liquids, such as clear broth or bouillon, black coffee or tea, clear juice, clear soft drinks or sports drinks, gelatin dessert, and popsicles. Avoid any liquids that contain red or purple dye.  On the day of the procedure - do not eat or drink anything during the 2 hours before the procedure, or within the time period that your health care provider recommends.  Bowel prep If you were prescribed an oral bowel prep  to clean out your colon:  Take it as told by your health care provider. Starting the day before your procedure, you will need to drink a large amount of medicated liquid. The liquid will cause you to have multiple loose stools until your stool is almost clear or light green.  If your skin or anus gets irritated from diarrhea, you may use these to relieve the irritation: ? Medicated wipes, such as adult wet wipes with aloe and vitamin E. ? A skin soothing-product like petroleum jelly.  If you vomit while drinking the bowel prep, take a break for up to 60 minutes and then begin the bowel prep again. If vomiting continues and you cannot take the bowel prep without vomiting, call your health care provider.  General instructions  Ask your health care provider about changing or stopping your regular medicines. This is especially important if you are taking diabetes medicines or blood thinners.  Plan to have someone take you home from the hospital or clinic. What happens during the procedure?  An IV tube may be inserted into one of your veins.  You will be given medicine to help you relax (sedative).  To reduce your risk of infection: ? Your health care team will wash or sanitize their hands. ? Your anal area will be washed with soap.  You will be asked to lie on your side with your knees bent.  Your health care provider will lubricate a long, thin, flexible tube. The tube will have a camera and a light on the end.  The tube will be inserted into your   anus.  The tube will be gently eased through your rectum and colon.  Air will be delivered into your colon to keep it open. You may feel some pressure or cramping.  The camera will be used to take images during the procedure.  A small tissue sample may be removed from your body to be examined under a microscope (biopsy). If any potential problems are found, the tissue will be sent to a lab for testing.  If small polyps are found, your  health care provider may remove them and have them checked for cancer cells.  The tube that was inserted into your anus will be slowly removed. The procedure may vary among health care providers and hospitals. What happens after the procedure?  Your blood pressure, heart rate, breathing rate, and blood oxygen level will be monitored until the medicines you were given have worn off.  Do not drive for 24 hours after the exam.  You may have a small amount of blood in your stool.  You may pass gas and have mild abdominal cramping or bloating due to the air that was used to inflate your colon during the exam.  It is up to you to get the results of your procedure. Ask your health care provider, or the department performing the procedure, when your results will be ready. This information is not intended to replace advice given to you by your health care provider. Make sure you discuss any questions you have with your health care provider. Document Released: 03/16/2000 Document Revised: 01/18/2016 Document Reviewed: 05/31/2015 [employer]vier Interactive Patient Education  2018 [employer]  

## 2017-07-09 ENCOUNTER — Other Ambulatory Visit: Payer: Self-pay

## 2017-07-09 ENCOUNTER — Encounter (HOSPITAL_COMMUNITY)
Admission: RE | Disposition: A | Discharge status: Home / Self Care | Payer: Self-pay | Source: Ambulatory Visit | Attending: General Surgery

## 2017-07-09 ENCOUNTER — Encounter (HOSPITAL_COMMUNITY): Payer: Self-pay | Admitting: *Deleted

## 2017-07-09 ENCOUNTER — Ambulatory Visit (HOSPITAL_COMMUNITY)
Admission: RE | Admit: 2017-07-09 | Discharge: 2017-07-09 | Disposition: A | Discharge status: Home / Self Care | Payer: Medicare Other | Source: Ambulatory Visit | Attending: General Surgery | Admitting: General Surgery

## 2017-07-09 DIAGNOSIS — I10 Essential (primary) hypertension: Secondary | ICD-10-CM | POA: Insufficient documentation

## 2017-07-09 DIAGNOSIS — K5730 Diverticulosis of large intestine without perforation or abscess without bleeding: Secondary | ICD-10-CM | POA: Diagnosis not present

## 2017-07-09 DIAGNOSIS — Z9049 Acquired absence of other specified parts of digestive tract: Secondary | ICD-10-CM | POA: Insufficient documentation

## 2017-07-09 DIAGNOSIS — Z85038 Personal history of other malignant neoplasm of large intestine: Secondary | ICD-10-CM

## 2017-07-09 DIAGNOSIS — Z79899 Other long term (current) drug therapy: Secondary | ICD-10-CM | POA: Insufficient documentation

## 2017-07-09 DIAGNOSIS — Z79890 Hormone replacement therapy (postmenopausal): Secondary | ICD-10-CM | POA: Diagnosis not present

## 2017-07-09 DIAGNOSIS — E039 Hypothyroidism, unspecified: Secondary | ICD-10-CM | POA: Insufficient documentation

## 2017-07-09 DIAGNOSIS — Z853 Personal history of malignant neoplasm of breast: Secondary | ICD-10-CM | POA: Insufficient documentation

## 2017-07-09 DIAGNOSIS — Z1211 Encounter for screening for malignant neoplasm of colon: Secondary | ICD-10-CM | POA: Insufficient documentation

## 2017-07-09 HISTORY — PX: COLONOSCOPY: SHX5424

## 2017-07-09 SURGERY — COLONOSCOPY
Anesthesia: Moderate Sedation

## 2017-07-09 MED ORDER — MEPERIDINE HCL 50 MG/ML IJ SOLN
INTRAMUSCULAR | Status: DC | PRN
  Administered 2017-07-09: 50 mg via INTRAVENOUS

## 2017-07-09 MED ORDER — MIDAZOLAM HCL 5 MG/5ML IJ SOLN
INTRAMUSCULAR | Status: AC
  Filled 2017-07-09: qty 10

## 2017-07-09 MED ORDER — MEPERIDINE HCL 50 MG/ML IJ SOLN
INTRAMUSCULAR | Status: AC
  Filled 2017-07-09: qty 1

## 2017-07-09 MED ORDER — SODIUM CHLORIDE 0.9 % IV SOLN
INTRAVENOUS | Status: DC
  Administered 2017-07-09: 07:00:00 via INTRAVENOUS

## 2017-07-09 MED ORDER — STERILE WATER FOR IRRIGATION IR SOLN
Status: DC | PRN
  Administered 2017-07-09: 100 mL

## 2017-07-09 MED ORDER — MIDAZOLAM HCL 5 MG/5ML IJ SOLN
INTRAMUSCULAR | Status: DC | PRN
  Administered 2017-07-09: 1 mg via INTRAVENOUS
  Administered 2017-07-09: 2 mg via INTRAVENOUS
  Administered 2017-07-09: 1 mg via INTRAVENOUS

## 2017-07-09 NOTE — Op Note (Signed)
[facility] Patient Name: Wanda Cohen Procedure Date: 07/09/2017 7:04 AM MRN: [id number] Date of Birth: 05-Jul-1934 Attending MD: Aviva Signs , MD CSN: [id number] Age: 83 Admit Type: Outpatient Procedure:                Colonoscopy Indications:              High Cohen colon cancer surveillance: Personal                            history of colon cancer Providers:                Aviva Signs, MD, Rosina Lowenstein, RN, Aram Candela Referring MD:              Medicines:                Meperidine 50 mg IV, Midazolam 4 mg IV Complications:            No immediate complications. Estimated blood loss:                            None. Estimated Blood Loss:     Estimated blood loss: none. Procedure:                Pre-Anesthesia Assessment:                           - Prior to the procedure, a History and Physical                            was performed, and patient medications and                            allergies were reviewed. The patient is competent.                            The risks and benefits of the procedure and the                            sedation options and risks were discussed with the                            patient. All questions were answered and informed                            consent was obtained. Patient identification and                            proposed procedure were verified by the physician,                            the nurse and the technician in the endoscopy                            suite. Mental Status Examination: alert and  oriented. Airway Examination: normal oropharyngeal                            airway and neck mobility. Respiratory Examination:                            clear to auscultation. CV Examination: RRR, no                            murmurs, no S3 or S4. Prophylactic Antibiotics: The                            patient does not require prophylactic antibiotics.   Prior Anticoagulants: The patient has taken no                            previous anticoagulant or antiplatelet agents. ASA                            Grade Assessment: II - A patient with mild systemic                            disease. After reviewing the risks and benefits,                            the patient was deemed in satisfactory condition to                            undergo the procedure. The anesthesia plan was to                            use moderate sedation / analgesia (conscious                            sedation). Immediately prior to administration of                            medications, the patient was re-assessed for                            adequacy to receive sedatives. The heart rate,                            respiratory rate, oxygen saturations, blood                            pressure, adequacy of pulmonary ventilation, and                            response to care were monitored throughout the                            procedure. The physical status of the patient was  re-assessed after the procedure.                           After obtaining informed consent, the colonoscope                            was passed under direct vision. Throughout the                            procedure, the patient's blood pressure, pulse, and                            oxygen saturations were monitored continuously. The                            EC-[id number] ([id number]) scope was introduced through                            the anus and advanced to the the ileocolonic                            anastomosis. No anatomical landmarks were                            photographed. The colonoscopy was somewhat                            difficult due to a tortuous colon. The patient                            tolerated the procedure well. The quality of the                            bowel preparation was adequate. The total duration                             of the procedure was 36 minutes. Scope In: 7:28:41 AM Scope Out: 8:03:08 AM Scope Withdrawal Time: 0 hours 1 minute 43 seconds  Total Procedure Duration: 0 hours 34 minutes 27 seconds  Findings:      The perianal and digital rectal examinations were normal.      Scattered medium-mouthed diverticula were found in the sigmoid colon.       There was no evidence of diverticular bleeding. Estimated blood loss:       none.      The exam was otherwise without abnormality. Impression:               - Moderate diverticulosis in the sigmoid colon.                            There was no evidence of diverticular bleeding.                            Ileocolic anastomosis widely patent.                           -  The examination was otherwise normal.                           - No specimens collected. Moderate Sedation:      Moderate (conscious) sedation was administered by the endoscopy nurse       and supervised by the endoscopist. The patient's oxygen saturation,       heart rate, blood pressure and response to care were monitored. Recommendation:           - Patient has a contact number available for                            emergencies. The signs and symptoms of potential                            delayed complications were discussed with the                            patient. Return to normal activities tomorrow.                            Written discharge instructions were provided to the                            patient.                           - Written discharge instructions were provided to                            the patient.                           - The signs and symptoms of potential delayed                            complications were discussed with the patient.                           - Patient has a contact number available for                            emergencies.                           - Return to normal activities tomorrow.                            - Resume previous diet.                           - Continue present medications.                           - No repeat colonoscopy due to age. Procedure Code(s):        --- Professional ---  45378, Colonoscopy, flexible; diagnostic, including                            collection of specimen(s) by brushing or washing,                            when performed (separate procedure) Diagnosis Code(s):        --- Professional ---                           E33.435, Personal history of other malignant                            neoplasm of large intestine                           K57.30, Diverticulosis of large intestine without                            perforation or abscess without bleeding CPT copyright 2017 American [employer] Association. All rights reserved. The codes documented in this report are preliminary and upon coder review may  be revised to meet current compliance requirements. Aviva Signs, MD Aviva Signs, MD 07/09/2017 8:10:01 AM This report has been signed electronically. Number of Addenda: 0

## 2017-07-09 NOTE — Discharge Instructions (Signed)
Colonoscopy, Adult, Care After  This sheet gives you information about how to care for yourself after your procedure. Your health care provider may also give you more specific instructions. If you have problems or questions, contact your health care provider.  What can I expect after the procedure?  After the procedure, it is common to have:  · A small amount of blood in your stool for 24 hours after the procedure.  · Some gas.  · Mild abdominal cramping or bloating.    Follow these instructions at home:  General instructions    · For the first 24 hours after the procedure:  ? Do not drive or use machinery.  ? Do not sign important documents.  ? Do not drink alcohol.  ? Do your regular daily activities at a slower pace than normal.  ? Eat soft, easy-to-digest foods.  ? Rest often.  · Take over-the-counter or prescription medicines only as told by your health care provider.  · It is up to you to get the results of your procedure. Ask your health care provider, or the department performing the procedure, when your results will be ready.  Relieving cramping and bloating  · Try walking around when you have cramps or feel bloated.  · Apply heat to your abdomen as told by your health care provider. Use a heat source that your health care provider recommends, such as a moist heat pack or a heating pad.  ? Place a towel between your skin and the heat source.  ? Leave the heat on for 20-30 minutes.  ? Remove the heat if your skin turns bright red. This is especially important if you are unable to feel pain, heat, or cold. You may have a greater risk of getting burned.  Eating and drinking  · Drink enough fluid to keep your urine clear or pale yellow.  · Resume your normal diet as instructed by your health care provider. Avoid heavy or fried foods that are hard to digest.  · Avoid drinking alcohol for as long as instructed by your health care provider.  Contact a health care provider if:  · You have blood in your stool 2-3  days after the procedure.  Get help right away if:  · You have more than a small spotting of blood in your stool.  · You pass large blood clots in your stool.  · Your abdomen is swollen.  · You have nausea or vomiting.  · You have a fever.  · You have increasing abdominal pain that is not relieved with medicine.  This information is not intended to replace advice given to you by your health care provider. Make sure you discuss any questions you have with your health care provider.  Document Released: 11/01/2003 Document Revised: 12/12/2015 Document Reviewed: 05/31/2015  Elsevier Interactive Patient Education © 2018 [employer]

## 2017-07-09 NOTE — Interval H&P Note (Signed)
History and Physical Interval Note:  07/09/2017 7:24 AM  Wanda Cohen  has presented today for surgery, with the diagnosis of history of colon cancer  The various methods of treatment have been discussed with the patient and family. After consideration of risks, benefits and other options for treatment, the patient has consented to  Procedure(s): COLONOSCOPY (N/A) as a surgical intervention .  The patient's history has been reviewed, patient examined, no change in status, stable for surgery.  I have reviewed the patient's chart and labs.  Questions were answered to the patient's satisfaction.     Aviva Signs

## 2017-07-16 ENCOUNTER — Encounter (HOSPITAL_COMMUNITY): Payer: Self-pay | Admitting: General Surgery

## 2017-11-26 DIAGNOSIS — Z9012 Acquired absence of left breast and nipple: Secondary | ICD-10-CM | POA: Diagnosis not present

## 2017-11-26 DIAGNOSIS — Z1231 Encounter for screening mammogram for malignant neoplasm of breast: Secondary | ICD-10-CM | POA: Diagnosis not present

## 2018-04-04 DIAGNOSIS — Z6825 Body mass index (BMI) 25.0-25.9, adult: Secondary | ICD-10-CM | POA: Diagnosis not present

## 2018-04-04 DIAGNOSIS — N6452 Nipple discharge: Secondary | ICD-10-CM | POA: Diagnosis not present

## 2018-04-16 DIAGNOSIS — N6452 Nipple discharge: Secondary | ICD-10-CM | POA: Diagnosis not present

## 2018-04-16 DIAGNOSIS — N6312 Unspecified lump in the right breast, upper inner quadrant: Secondary | ICD-10-CM | POA: Diagnosis not present

## 2018-04-16 DIAGNOSIS — N6314 Unspecified lump in the right breast, lower inner quadrant: Secondary | ICD-10-CM | POA: Diagnosis not present

## 2018-04-16 DIAGNOSIS — R928 Other abnormal and inconclusive findings on diagnostic imaging of breast: Secondary | ICD-10-CM | POA: Diagnosis not present

## 2018-04-16 DIAGNOSIS — Z853 Personal history of malignant neoplasm of breast: Secondary | ICD-10-CM | POA: Diagnosis not present

## 2018-04-16 DIAGNOSIS — N6315 Unspecified lump in the right breast, overlapping quadrants: Secondary | ICD-10-CM | POA: Diagnosis not present

## 2018-04-30 DIAGNOSIS — N6341 Unspecified lump in right breast, subareolar: Secondary | ICD-10-CM | POA: Diagnosis not present

## 2018-04-30 DIAGNOSIS — D241 Benign neoplasm of right breast: Secondary | ICD-10-CM | POA: Diagnosis not present

## 2018-05-13 DIAGNOSIS — E039 Hypothyroidism, unspecified: Secondary | ICD-10-CM | POA: Diagnosis not present

## 2018-05-13 DIAGNOSIS — Z0001 Encounter for general adult medical examination with abnormal findings: Secondary | ICD-10-CM | POA: Diagnosis not present

## 2018-05-13 DIAGNOSIS — I10 Essential (primary) hypertension: Secondary | ICD-10-CM | POA: Diagnosis not present

## 2018-05-13 DIAGNOSIS — N182 Chronic kidney disease, stage 2 (mild): Secondary | ICD-10-CM | POA: Diagnosis not present

## 2018-05-13 DIAGNOSIS — Z6825 Body mass index (BMI) 25.0-25.9, adult: Secondary | ICD-10-CM | POA: Diagnosis not present

## 2018-05-13 DIAGNOSIS — E559 Vitamin D deficiency, unspecified: Secondary | ICD-10-CM | POA: Diagnosis not present

## 2018-05-13 DIAGNOSIS — D519 Vitamin B12 deficiency anemia, unspecified: Secondary | ICD-10-CM | POA: Diagnosis not present

## 2018-05-15 DIAGNOSIS — Z6825 Body mass index (BMI) 25.0-25.9, adult: Secondary | ICD-10-CM | POA: Diagnosis not present

## 2018-05-15 DIAGNOSIS — N183 Chronic kidney disease, stage 3 (moderate): Secondary | ICD-10-CM | POA: Diagnosis not present

## 2018-05-15 DIAGNOSIS — Z85038 Personal history of other malignant neoplasm of large intestine: Secondary | ICD-10-CM | POA: Diagnosis not present

## 2018-05-15 DIAGNOSIS — D249 Benign neoplasm of unspecified breast: Secondary | ICD-10-CM | POA: Diagnosis not present

## 2018-05-15 DIAGNOSIS — E782 Mixed hyperlipidemia: Secondary | ICD-10-CM | POA: Diagnosis not present

## 2018-05-15 DIAGNOSIS — E039 Hypothyroidism, unspecified: Secondary | ICD-10-CM | POA: Diagnosis not present

## 2018-05-15 DIAGNOSIS — G5620 Lesion of ulnar nerve, unspecified upper limb: Secondary | ICD-10-CM | POA: Diagnosis not present

## 2018-05-15 DIAGNOSIS — Z853 Personal history of malignant neoplasm of breast: Secondary | ICD-10-CM | POA: Diagnosis not present

## 2018-05-21 DIAGNOSIS — D241 Benign neoplasm of right breast: Secondary | ICD-10-CM | POA: Diagnosis not present

## 2018-06-03 DIAGNOSIS — Z85038 Personal history of other malignant neoplasm of large intestine: Secondary | ICD-10-CM | POA: Diagnosis not present

## 2018-06-03 DIAGNOSIS — Z9049 Acquired absence of other specified parts of digestive tract: Secondary | ICD-10-CM | POA: Diagnosis not present

## 2018-06-03 DIAGNOSIS — D241 Benign neoplasm of right breast: Secondary | ICD-10-CM | POA: Diagnosis not present

## 2018-06-03 DIAGNOSIS — Z9012 Acquired absence of left breast and nipple: Secondary | ICD-10-CM | POA: Diagnosis not present

## 2018-06-03 DIAGNOSIS — Z853 Personal history of malignant neoplasm of breast: Secondary | ICD-10-CM | POA: Diagnosis not present

## 2018-06-03 DIAGNOSIS — Z780 Asymptomatic menopausal state: Secondary | ICD-10-CM | POA: Diagnosis not present

## 2018-06-03 DIAGNOSIS — M810 Age-related osteoporosis without current pathological fracture: Secondary | ICD-10-CM | POA: Diagnosis not present

## 2018-06-03 DIAGNOSIS — M2550 Pain in unspecified joint: Secondary | ICD-10-CM | POA: Diagnosis not present

## 2018-06-03 DIAGNOSIS — E039 Hypothyroidism, unspecified: Secondary | ICD-10-CM | POA: Diagnosis not present

## 2018-06-04 DIAGNOSIS — Z853 Personal history of malignant neoplasm of breast: Secondary | ICD-10-CM | POA: Diagnosis not present

## 2018-06-04 DIAGNOSIS — M2550 Pain in unspecified joint: Secondary | ICD-10-CM | POA: Diagnosis not present

## 2018-06-04 DIAGNOSIS — E039 Hypothyroidism, unspecified: Secondary | ICD-10-CM | POA: Diagnosis not present

## 2018-06-04 DIAGNOSIS — D241 Benign neoplasm of right breast: Secondary | ICD-10-CM | POA: Diagnosis not present

## 2018-06-04 DIAGNOSIS — Z85038 Personal history of other malignant neoplasm of large intestine: Secondary | ICD-10-CM | POA: Diagnosis not present

## 2018-06-04 DIAGNOSIS — Z9049 Acquired absence of other specified parts of digestive tract: Secondary | ICD-10-CM | POA: Diagnosis not present

## 2018-06-04 DIAGNOSIS — N6310 Unspecified lump in the right breast, unspecified quadrant: Secondary | ICD-10-CM | POA: Diagnosis not present

## 2018-11-10 DIAGNOSIS — N183 Chronic kidney disease, stage 3 (moderate): Secondary | ICD-10-CM | POA: Diagnosis not present

## 2018-11-10 DIAGNOSIS — E782 Mixed hyperlipidemia: Secondary | ICD-10-CM | POA: Diagnosis not present

## 2018-11-10 DIAGNOSIS — Z853 Personal history of malignant neoplasm of breast: Secondary | ICD-10-CM | POA: Diagnosis not present

## 2018-11-10 DIAGNOSIS — E039 Hypothyroidism, unspecified: Secondary | ICD-10-CM | POA: Diagnosis not present

## 2018-11-10 DIAGNOSIS — I10 Essential (primary) hypertension: Secondary | ICD-10-CM | POA: Diagnosis not present

## 2018-11-13 DIAGNOSIS — Z1331 Encounter for screening for depression: Secondary | ICD-10-CM | POA: Diagnosis not present

## 2018-11-13 DIAGNOSIS — Z6825 Body mass index (BMI) 25.0-25.9, adult: Secondary | ICD-10-CM | POA: Diagnosis not present

## 2018-11-13 DIAGNOSIS — G5620 Lesion of ulnar nerve, unspecified upper limb: Secondary | ICD-10-CM | POA: Diagnosis not present

## 2018-11-13 DIAGNOSIS — M810 Age-related osteoporosis without current pathological fracture: Secondary | ICD-10-CM | POA: Diagnosis not present

## 2018-11-13 DIAGNOSIS — Z85038 Personal history of other malignant neoplasm of large intestine: Secondary | ICD-10-CM | POA: Diagnosis not present

## 2018-11-13 DIAGNOSIS — Z853 Personal history of malignant neoplasm of breast: Secondary | ICD-10-CM | POA: Diagnosis not present

## 2018-11-13 DIAGNOSIS — Z1389 Encounter for screening for other disorder: Secondary | ICD-10-CM | POA: Diagnosis not present

## 2018-11-13 DIAGNOSIS — R202 Paresthesia of skin: Secondary | ICD-10-CM | POA: Diagnosis not present

## 2018-11-24 DIAGNOSIS — C189 Malignant neoplasm of colon, unspecified: Secondary | ICD-10-CM | POA: Diagnosis not present

## 2018-11-24 DIAGNOSIS — C50919 Malignant neoplasm of unspecified site of unspecified female breast: Secondary | ICD-10-CM | POA: Diagnosis not present

## 2018-11-24 DIAGNOSIS — Z8042 Family history of malignant neoplasm of prostate: Secondary | ICD-10-CM | POA: Diagnosis not present

## 2018-12-02 DIAGNOSIS — Z8042 Family history of malignant neoplasm of prostate: Secondary | ICD-10-CM | POA: Diagnosis not present

## 2018-12-02 DIAGNOSIS — C189 Malignant neoplasm of colon, unspecified: Secondary | ICD-10-CM | POA: Diagnosis not present

## 2018-12-02 DIAGNOSIS — C50919 Malignant neoplasm of unspecified site of unspecified female breast: Secondary | ICD-10-CM | POA: Diagnosis not present

## 2018-12-25 DIAGNOSIS — E039 Hypothyroidism, unspecified: Secondary | ICD-10-CM | POA: Diagnosis not present

## 2019-01-30 DIAGNOSIS — E039 Hypothyroidism, unspecified: Secondary | ICD-10-CM | POA: Diagnosis not present

## 2019-01-30 DIAGNOSIS — E782 Mixed hyperlipidemia: Secondary | ICD-10-CM | POA: Diagnosis not present

## 2019-02-05 DIAGNOSIS — H353110 Nonexudative age-related macular degeneration, right eye, stage unspecified: Secondary | ICD-10-CM | POA: Diagnosis not present

## 2019-03-03 ENCOUNTER — Other Ambulatory Visit: Payer: Self-pay

## 2019-03-03 DIAGNOSIS — Z20828 Contact with and (suspected) exposure to other viral communicable diseases: Secondary | ICD-10-CM | POA: Diagnosis not present

## 2019-03-03 DIAGNOSIS — Z20822 Contact with and (suspected) exposure to covid-19: Secondary | ICD-10-CM

## 2019-03-05 LAB — NOVEL CORONAVIRUS, NAA: SARS-CoV-2, NAA: NOT DETECTED

## 2019-04-21 DIAGNOSIS — B029 Zoster without complications: Secondary | ICD-10-CM | POA: Diagnosis not present

## 2019-04-28 DIAGNOSIS — Z1231 Encounter for screening mammogram for malignant neoplasm of breast: Secondary | ICD-10-CM | POA: Diagnosis not present

## 2019-04-28 DIAGNOSIS — Z9012 Acquired absence of left breast and nipple: Secondary | ICD-10-CM | POA: Diagnosis not present

## 2019-05-11 DIAGNOSIS — E039 Hypothyroidism, unspecified: Secondary | ICD-10-CM | POA: Diagnosis not present

## 2019-05-11 DIAGNOSIS — I10 Essential (primary) hypertension: Secondary | ICD-10-CM | POA: Diagnosis not present

## 2019-05-11 DIAGNOSIS — E7800 Pure hypercholesterolemia, unspecified: Secondary | ICD-10-CM | POA: Diagnosis not present

## 2019-05-11 DIAGNOSIS — E782 Mixed hyperlipidemia: Secondary | ICD-10-CM | POA: Diagnosis not present

## 2019-05-11 DIAGNOSIS — N183 Chronic kidney disease, stage 3 unspecified: Secondary | ICD-10-CM | POA: Diagnosis not present

## 2019-05-11 DIAGNOSIS — M810 Age-related osteoporosis without current pathological fracture: Secondary | ICD-10-CM | POA: Diagnosis not present

## 2019-05-11 DIAGNOSIS — Z85038 Personal history of other malignant neoplasm of large intestine: Secondary | ICD-10-CM | POA: Diagnosis not present

## 2019-05-14 DIAGNOSIS — M810 Age-related osteoporosis without current pathological fracture: Secondary | ICD-10-CM | POA: Diagnosis not present

## 2019-05-14 DIAGNOSIS — G5620 Lesion of ulnar nerve, unspecified upper limb: Secondary | ICD-10-CM | POA: Diagnosis not present

## 2019-05-14 DIAGNOSIS — Z6824 Body mass index (BMI) 24.0-24.9, adult: Secondary | ICD-10-CM | POA: Diagnosis not present

## 2019-05-14 DIAGNOSIS — E039 Hypothyroidism, unspecified: Secondary | ICD-10-CM | POA: Diagnosis not present

## 2019-05-14 DIAGNOSIS — Z0001 Encounter for general adult medical examination with abnormal findings: Secondary | ICD-10-CM | POA: Diagnosis not present

## 2019-05-14 DIAGNOSIS — E782 Mixed hyperlipidemia: Secondary | ICD-10-CM | POA: Diagnosis not present

## 2019-05-14 DIAGNOSIS — R202 Paresthesia of skin: Secondary | ICD-10-CM | POA: Diagnosis not present

## 2019-05-18 DIAGNOSIS — Z13820 Encounter for screening for osteoporosis: Secondary | ICD-10-CM | POA: Diagnosis not present

## 2019-05-18 DIAGNOSIS — M8588 Other specified disorders of bone density and structure, other site: Secondary | ICD-10-CM | POA: Diagnosis not present

## 2019-05-18 DIAGNOSIS — M810 Age-related osteoporosis without current pathological fracture: Secondary | ICD-10-CM | POA: Diagnosis not present

## 2019-06-02 DIAGNOSIS — Z23 Encounter for immunization: Secondary | ICD-10-CM | POA: Diagnosis not present

## 2019-06-30 DIAGNOSIS — Z23 Encounter for immunization: Secondary | ICD-10-CM | POA: Diagnosis not present

## 2019-07-01 DIAGNOSIS — I10 Essential (primary) hypertension: Secondary | ICD-10-CM | POA: Diagnosis not present

## 2019-07-01 DIAGNOSIS — E7849 Other hyperlipidemia: Secondary | ICD-10-CM | POA: Diagnosis not present

## 2019-07-09 DIAGNOSIS — R000 Tachycardia, unspecified: Secondary | ICD-10-CM | POA: Diagnosis not present

## 2019-07-09 DIAGNOSIS — R5381 Other malaise: Secondary | ICD-10-CM | POA: Diagnosis not present

## 2019-07-09 DIAGNOSIS — I499 Cardiac arrhythmia, unspecified: Secondary | ICD-10-CM | POA: Diagnosis not present

## 2019-08-12 DIAGNOSIS — M48061 Spinal stenosis, lumbar region without neurogenic claudication: Secondary | ICD-10-CM | POA: Diagnosis not present

## 2019-08-12 DIAGNOSIS — R269 Unspecified abnormalities of gait and mobility: Secondary | ICD-10-CM | POA: Diagnosis not present

## 2019-08-12 DIAGNOSIS — M545 Low back pain: Secondary | ICD-10-CM | POA: Diagnosis not present

## 2019-08-12 DIAGNOSIS — M810 Age-related osteoporosis without current pathological fracture: Secondary | ICD-10-CM | POA: Diagnosis not present

## 2019-08-12 DIAGNOSIS — Z6824 Body mass index (BMI) 24.0-24.9, adult: Secondary | ICD-10-CM | POA: Diagnosis not present

## 2019-08-12 DIAGNOSIS — M4310 Spondylolisthesis, site unspecified: Secondary | ICD-10-CM | POA: Diagnosis not present

## 2019-08-31 DIAGNOSIS — N183 Chronic kidney disease, stage 3 unspecified: Secondary | ICD-10-CM | POA: Diagnosis not present

## 2019-08-31 DIAGNOSIS — I129 Hypertensive chronic kidney disease with stage 1 through stage 4 chronic kidney disease, or unspecified chronic kidney disease: Secondary | ICD-10-CM | POA: Diagnosis not present

## 2019-08-31 DIAGNOSIS — M810 Age-related osteoporosis without current pathological fracture: Secondary | ICD-10-CM | POA: Diagnosis not present

## 2019-08-31 DIAGNOSIS — E7849 Other hyperlipidemia: Secondary | ICD-10-CM | POA: Diagnosis not present

## 2019-10-27 DIAGNOSIS — M19031 Primary osteoarthritis, right wrist: Secondary | ICD-10-CM | POA: Diagnosis not present

## 2019-10-27 DIAGNOSIS — M79641 Pain in right hand: Secondary | ICD-10-CM | POA: Diagnosis not present

## 2019-10-30 DIAGNOSIS — I10 Essential (primary) hypertension: Secondary | ICD-10-CM | POA: Diagnosis not present

## 2019-10-30 DIAGNOSIS — E039 Hypothyroidism, unspecified: Secondary | ICD-10-CM | POA: Diagnosis not present

## 2019-10-30 DIAGNOSIS — N183 Chronic kidney disease, stage 3 unspecified: Secondary | ICD-10-CM | POA: Diagnosis not present

## 2019-10-30 DIAGNOSIS — E782 Mixed hyperlipidemia: Secondary | ICD-10-CM | POA: Diagnosis not present

## 2019-11-04 DIAGNOSIS — K5900 Constipation, unspecified: Secondary | ICD-10-CM | POA: Diagnosis not present

## 2019-11-04 DIAGNOSIS — R202 Paresthesia of skin: Secondary | ICD-10-CM | POA: Diagnosis not present

## 2019-11-04 DIAGNOSIS — E782 Mixed hyperlipidemia: Secondary | ICD-10-CM | POA: Diagnosis not present

## 2019-11-04 DIAGNOSIS — G5620 Lesion of ulnar nerve, unspecified upper limb: Secondary | ICD-10-CM | POA: Diagnosis not present

## 2019-11-04 DIAGNOSIS — M810 Age-related osteoporosis without current pathological fracture: Secondary | ICD-10-CM | POA: Diagnosis not present

## 2019-11-04 DIAGNOSIS — Z6824 Body mass index (BMI) 24.0-24.9, adult: Secondary | ICD-10-CM | POA: Diagnosis not present

## 2019-12-09 DIAGNOSIS — Z01818 Encounter for other preprocedural examination: Secondary | ICD-10-CM | POA: Diagnosis not present

## 2019-12-09 DIAGNOSIS — Z6824 Body mass index (BMI) 24.0-24.9, adult: Secondary | ICD-10-CM | POA: Diagnosis not present

## 2019-12-31 DIAGNOSIS — E7849 Other hyperlipidemia: Secondary | ICD-10-CM | POA: Diagnosis not present

## 2019-12-31 DIAGNOSIS — I129 Hypertensive chronic kidney disease with stage 1 through stage 4 chronic kidney disease, or unspecified chronic kidney disease: Secondary | ICD-10-CM | POA: Diagnosis not present

## 2019-12-31 DIAGNOSIS — N183 Chronic kidney disease, stage 3 unspecified: Secondary | ICD-10-CM | POA: Diagnosis not present

## 2019-12-31 DIAGNOSIS — M810 Age-related osteoporosis without current pathological fracture: Secondary | ICD-10-CM | POA: Diagnosis not present

## 2020-01-05 DIAGNOSIS — M542 Cervicalgia: Secondary | ICD-10-CM | POA: Diagnosis not present

## 2020-01-05 DIAGNOSIS — Z6824 Body mass index (BMI) 24.0-24.9, adult: Secondary | ICD-10-CM | POA: Diagnosis not present

## 2020-01-30 DIAGNOSIS — E7849 Other hyperlipidemia: Secondary | ICD-10-CM | POA: Diagnosis not present

## 2020-01-30 DIAGNOSIS — I129 Hypertensive chronic kidney disease with stage 1 through stage 4 chronic kidney disease, or unspecified chronic kidney disease: Secondary | ICD-10-CM | POA: Diagnosis not present

## 2020-01-30 DIAGNOSIS — M810 Age-related osteoporosis without current pathological fracture: Secondary | ICD-10-CM | POA: Diagnosis not present

## 2020-01-30 DIAGNOSIS — N183 Chronic kidney disease, stage 3 unspecified: Secondary | ICD-10-CM | POA: Diagnosis not present

## 2020-02-08 DIAGNOSIS — S93402A Sprain of unspecified ligament of left ankle, initial encounter: Secondary | ICD-10-CM | POA: Diagnosis not present

## 2020-02-08 DIAGNOSIS — M25572 Pain in left ankle and joints of left foot: Secondary | ICD-10-CM | POA: Diagnosis not present

## 2020-03-03 DIAGNOSIS — M79672 Pain in left foot: Secondary | ICD-10-CM | POA: Diagnosis not present

## 2020-03-03 DIAGNOSIS — M1990 Unspecified osteoarthritis, unspecified site: Secondary | ICD-10-CM | POA: Diagnosis not present

## 2020-03-03 DIAGNOSIS — S93412A Sprain of calcaneofibular ligament of left ankle, initial encounter: Secondary | ICD-10-CM | POA: Diagnosis not present

## 2020-03-03 DIAGNOSIS — M25579 Pain in unspecified ankle and joints of unspecified foot: Secondary | ICD-10-CM | POA: Diagnosis not present

## 2020-04-01 DIAGNOSIS — E7849 Other hyperlipidemia: Secondary | ICD-10-CM | POA: Diagnosis not present

## 2020-04-01 DIAGNOSIS — N183 Chronic kidney disease, stage 3 unspecified: Secondary | ICD-10-CM | POA: Diagnosis not present

## 2020-04-01 DIAGNOSIS — M810 Age-related osteoporosis without current pathological fracture: Secondary | ICD-10-CM | POA: Diagnosis not present

## 2020-04-01 DIAGNOSIS — I129 Hypertensive chronic kidney disease with stage 1 through stage 4 chronic kidney disease, or unspecified chronic kidney disease: Secondary | ICD-10-CM | POA: Diagnosis not present

## 2020-04-30 DIAGNOSIS — E7849 Other hyperlipidemia: Secondary | ICD-10-CM | POA: Diagnosis not present

## 2020-04-30 DIAGNOSIS — I129 Hypertensive chronic kidney disease with stage 1 through stage 4 chronic kidney disease, or unspecified chronic kidney disease: Secondary | ICD-10-CM | POA: Diagnosis not present

## 2020-04-30 DIAGNOSIS — N183 Chronic kidney disease, stage 3 unspecified: Secondary | ICD-10-CM | POA: Diagnosis not present

## 2020-04-30 DIAGNOSIS — M810 Age-related osteoporosis without current pathological fracture: Secondary | ICD-10-CM | POA: Diagnosis not present

## 2020-05-02 DIAGNOSIS — E039 Hypothyroidism, unspecified: Secondary | ICD-10-CM | POA: Diagnosis not present

## 2020-05-02 DIAGNOSIS — N183 Chronic kidney disease, stage 3 unspecified: Secondary | ICD-10-CM | POA: Diagnosis not present

## 2020-05-02 DIAGNOSIS — E7849 Other hyperlipidemia: Secondary | ICD-10-CM | POA: Diagnosis not present

## 2020-05-02 DIAGNOSIS — E7800 Pure hypercholesterolemia, unspecified: Secondary | ICD-10-CM | POA: Diagnosis not present

## 2020-05-02 DIAGNOSIS — Z20828 Contact with and (suspected) exposure to other viral communicable diseases: Secondary | ICD-10-CM | POA: Diagnosis not present

## 2020-05-02 DIAGNOSIS — E7801 Familial hypercholesterolemia: Secondary | ICD-10-CM | POA: Diagnosis not present

## 2020-05-02 DIAGNOSIS — E782 Mixed hyperlipidemia: Secondary | ICD-10-CM | POA: Diagnosis not present

## 2020-05-05 DIAGNOSIS — Z853 Personal history of malignant neoplasm of breast: Secondary | ICD-10-CM | POA: Diagnosis not present

## 2020-05-05 DIAGNOSIS — R202 Paresthesia of skin: Secondary | ICD-10-CM | POA: Diagnosis not present

## 2020-05-05 DIAGNOSIS — Z0001 Encounter for general adult medical examination with abnormal findings: Secondary | ICD-10-CM | POA: Diagnosis not present

## 2020-05-05 DIAGNOSIS — Z23 Encounter for immunization: Secondary | ICD-10-CM | POA: Diagnosis not present

## 2020-05-05 DIAGNOSIS — E7849 Other hyperlipidemia: Secondary | ICD-10-CM | POA: Diagnosis not present

## 2020-05-05 DIAGNOSIS — Z85038 Personal history of other malignant neoplasm of large intestine: Secondary | ICD-10-CM | POA: Diagnosis not present

## 2020-05-05 DIAGNOSIS — N183 Chronic kidney disease, stage 3 unspecified: Secondary | ICD-10-CM | POA: Diagnosis not present

## 2020-05-05 DIAGNOSIS — E039 Hypothyroidism, unspecified: Secondary | ICD-10-CM | POA: Diagnosis not present

## 2020-06-01 DIAGNOSIS — Z9012 Acquired absence of left breast and nipple: Secondary | ICD-10-CM | POA: Diagnosis not present

## 2020-06-01 DIAGNOSIS — Z1231 Encounter for screening mammogram for malignant neoplasm of breast: Secondary | ICD-10-CM | POA: Diagnosis not present

## 2020-06-08 DIAGNOSIS — M25519 Pain in unspecified shoulder: Secondary | ICD-10-CM | POA: Diagnosis not present

## 2020-06-08 DIAGNOSIS — M542 Cervicalgia: Secondary | ICD-10-CM | POA: Diagnosis not present

## 2020-06-08 DIAGNOSIS — M5410 Radiculopathy, site unspecified: Secondary | ICD-10-CM | POA: Diagnosis not present

## 2020-06-08 DIAGNOSIS — Z6824 Body mass index (BMI) 24.0-24.9, adult: Secondary | ICD-10-CM | POA: Diagnosis not present

## 2020-06-20 DIAGNOSIS — M4726 Other spondylosis with radiculopathy, lumbar region: Secondary | ICD-10-CM | POA: Diagnosis not present

## 2020-06-20 DIAGNOSIS — M25551 Pain in right hip: Secondary | ICD-10-CM | POA: Diagnosis not present

## 2020-06-20 DIAGNOSIS — M5136 Other intervertebral disc degeneration, lumbar region: Secondary | ICD-10-CM | POA: Diagnosis not present

## 2020-06-27 DIAGNOSIS — M48061 Spinal stenosis, lumbar region without neurogenic claudication: Secondary | ICD-10-CM | POA: Diagnosis not present

## 2020-06-27 DIAGNOSIS — R200 Anesthesia of skin: Secondary | ICD-10-CM | POA: Diagnosis not present

## 2020-06-27 DIAGNOSIS — M4726 Other spondylosis with radiculopathy, lumbar region: Secondary | ICD-10-CM | POA: Diagnosis not present

## 2020-06-27 DIAGNOSIS — M4319 Spondylolisthesis, multiple sites in spine: Secondary | ICD-10-CM | POA: Diagnosis not present

## 2020-06-27 DIAGNOSIS — K8020 Calculus of gallbladder without cholecystitis without obstruction: Secondary | ICD-10-CM | POA: Diagnosis not present

## 2020-06-27 DIAGNOSIS — M9973 Connective tissue and disc stenosis of intervertebral foramina of lumbar region: Secondary | ICD-10-CM | POA: Diagnosis not present

## 2020-06-29 DIAGNOSIS — E7849 Other hyperlipidemia: Secondary | ICD-10-CM | POA: Diagnosis not present

## 2020-06-29 DIAGNOSIS — M810 Age-related osteoporosis without current pathological fracture: Secondary | ICD-10-CM | POA: Diagnosis not present

## 2020-06-29 DIAGNOSIS — N183 Chronic kidney disease, stage 3 unspecified: Secondary | ICD-10-CM | POA: Diagnosis not present

## 2020-06-29 DIAGNOSIS — I129 Hypertensive chronic kidney disease with stage 1 through stage 4 chronic kidney disease, or unspecified chronic kidney disease: Secondary | ICD-10-CM | POA: Diagnosis not present

## 2020-07-04 DIAGNOSIS — M5416 Radiculopathy, lumbar region: Secondary | ICD-10-CM | POA: Diagnosis not present

## 2020-07-11 DIAGNOSIS — M25512 Pain in left shoulder: Secondary | ICD-10-CM | POA: Diagnosis not present

## 2020-07-11 DIAGNOSIS — M5412 Radiculopathy, cervical region: Secondary | ICD-10-CM | POA: Diagnosis not present

## 2020-07-11 DIAGNOSIS — M19011 Primary osteoarthritis, right shoulder: Secondary | ICD-10-CM | POA: Diagnosis not present

## 2020-07-11 DIAGNOSIS — M5030 Other cervical disc degeneration, unspecified cervical region: Secondary | ICD-10-CM | POA: Diagnosis not present

## 2020-07-25 DIAGNOSIS — M5412 Radiculopathy, cervical region: Secondary | ICD-10-CM | POA: Diagnosis not present

## 2020-07-25 DIAGNOSIS — M19011 Primary osteoarthritis, right shoulder: Secondary | ICD-10-CM | POA: Diagnosis not present

## 2020-07-25 DIAGNOSIS — M5030 Other cervical disc degeneration, unspecified cervical region: Secondary | ICD-10-CM | POA: Diagnosis not present

## 2020-08-11 DIAGNOSIS — M79601 Pain in right arm: Secondary | ICD-10-CM | POA: Diagnosis not present

## 2020-08-11 DIAGNOSIS — M25511 Pain in right shoulder: Secondary | ICD-10-CM | POA: Diagnosis not present

## 2020-08-11 DIAGNOSIS — D649 Anemia, unspecified: Secondary | ICD-10-CM | POA: Diagnosis not present

## 2020-08-11 DIAGNOSIS — G629 Polyneuropathy, unspecified: Secondary | ICD-10-CM | POA: Diagnosis not present

## 2020-08-11 DIAGNOSIS — R200 Anesthesia of skin: Secondary | ICD-10-CM | POA: Diagnosis not present

## 2020-08-11 DIAGNOSIS — M545 Low back pain, unspecified: Secondary | ICD-10-CM | POA: Diagnosis not present

## 2020-08-11 DIAGNOSIS — Z79899 Other long term (current) drug therapy: Secondary | ICD-10-CM | POA: Diagnosis not present

## 2020-08-11 DIAGNOSIS — E559 Vitamin D deficiency, unspecified: Secondary | ICD-10-CM | POA: Diagnosis not present

## 2020-08-11 DIAGNOSIS — E538 Deficiency of other specified B group vitamins: Secondary | ICD-10-CM | POA: Diagnosis not present

## 2020-08-11 DIAGNOSIS — R7303 Prediabetes: Secondary | ICD-10-CM | POA: Diagnosis not present

## 2020-08-12 DIAGNOSIS — D649 Anemia, unspecified: Secondary | ICD-10-CM | POA: Diagnosis not present

## 2020-08-12 DIAGNOSIS — E559 Vitamin D deficiency, unspecified: Secondary | ICD-10-CM | POA: Diagnosis not present

## 2020-08-12 DIAGNOSIS — E538 Deficiency of other specified B group vitamins: Secondary | ICD-10-CM | POA: Diagnosis not present

## 2020-08-12 DIAGNOSIS — R7303 Prediabetes: Secondary | ICD-10-CM | POA: Diagnosis not present

## 2020-08-12 DIAGNOSIS — Z79899 Other long term (current) drug therapy: Secondary | ICD-10-CM | POA: Diagnosis not present

## 2020-08-25 DIAGNOSIS — M10032 Idiopathic gout, left wrist: Secondary | ICD-10-CM | POA: Diagnosis not present

## 2020-08-25 DIAGNOSIS — G609 Hereditary and idiopathic neuropathy, unspecified: Secondary | ICD-10-CM | POA: Diagnosis not present

## 2020-09-19 DIAGNOSIS — M5412 Radiculopathy, cervical region: Secondary | ICD-10-CM | POA: Diagnosis not present

## 2020-09-19 DIAGNOSIS — M5030 Other cervical disc degeneration, unspecified cervical region: Secondary | ICD-10-CM | POA: Diagnosis not present

## 2020-09-19 DIAGNOSIS — M19011 Primary osteoarthritis, right shoulder: Secondary | ICD-10-CM | POA: Diagnosis not present

## 2020-09-19 DIAGNOSIS — M25532 Pain in left wrist: Secondary | ICD-10-CM | POA: Diagnosis not present

## 2020-09-20 DIAGNOSIS — E039 Hypothyroidism, unspecified: Secondary | ICD-10-CM | POA: Diagnosis not present

## 2020-09-20 DIAGNOSIS — D518 Other vitamin B12 deficiency anemias: Secondary | ICD-10-CM | POA: Diagnosis not present

## 2020-09-20 DIAGNOSIS — I10 Essential (primary) hypertension: Secondary | ICD-10-CM | POA: Diagnosis not present

## 2020-09-20 DIAGNOSIS — E559 Vitamin D deficiency, unspecified: Secondary | ICD-10-CM | POA: Diagnosis not present

## 2020-09-20 DIAGNOSIS — G5620 Lesion of ulnar nerve, unspecified upper limb: Secondary | ICD-10-CM | POA: Diagnosis not present

## 2020-09-20 DIAGNOSIS — M25519 Pain in unspecified shoulder: Secondary | ICD-10-CM | POA: Diagnosis not present

## 2020-09-20 DIAGNOSIS — N183 Chronic kidney disease, stage 3 unspecified: Secondary | ICD-10-CM | POA: Diagnosis not present

## 2020-09-20 DIAGNOSIS — M810 Age-related osteoporosis without current pathological fracture: Secondary | ICD-10-CM | POA: Diagnosis not present

## 2020-09-20 DIAGNOSIS — E782 Mixed hyperlipidemia: Secondary | ICD-10-CM | POA: Diagnosis not present

## 2020-09-20 DIAGNOSIS — E7849 Other hyperlipidemia: Secondary | ICD-10-CM | POA: Diagnosis not present

## 2020-09-21 DIAGNOSIS — E538 Deficiency of other specified B group vitamins: Secondary | ICD-10-CM | POA: Diagnosis not present

## 2020-09-29 DIAGNOSIS — E7849 Other hyperlipidemia: Secondary | ICD-10-CM | POA: Diagnosis not present

## 2020-09-29 DIAGNOSIS — N183 Chronic kidney disease, stage 3 unspecified: Secondary | ICD-10-CM | POA: Diagnosis not present

## 2020-09-29 DIAGNOSIS — I129 Hypertensive chronic kidney disease with stage 1 through stage 4 chronic kidney disease, or unspecified chronic kidney disease: Secondary | ICD-10-CM | POA: Diagnosis not present

## 2020-09-29 DIAGNOSIS — M810 Age-related osteoporosis without current pathological fracture: Secondary | ICD-10-CM | POA: Diagnosis not present

## 2020-10-04 DIAGNOSIS — Z6823 Body mass index (BMI) 23.0-23.9, adult: Secondary | ICD-10-CM | POA: Diagnosis not present

## 2020-10-04 DIAGNOSIS — M659 Synovitis and tenosynovitis, unspecified: Secondary | ICD-10-CM | POA: Diagnosis not present

## 2020-10-19 DIAGNOSIS — Z6823 Body mass index (BMI) 23.0-23.9, adult: Secondary | ICD-10-CM | POA: Diagnosis not present

## 2020-10-19 DIAGNOSIS — M2550 Pain in unspecified joint: Secondary | ICD-10-CM | POA: Diagnosis not present

## 2020-10-19 DIAGNOSIS — M659 Synovitis and tenosynovitis, unspecified: Secondary | ICD-10-CM | POA: Diagnosis not present

## 2020-10-26 DIAGNOSIS — E782 Mixed hyperlipidemia: Secondary | ICD-10-CM | POA: Diagnosis not present

## 2020-10-26 DIAGNOSIS — N182 Chronic kidney disease, stage 2 (mild): Secondary | ICD-10-CM | POA: Diagnosis not present

## 2020-10-26 DIAGNOSIS — E7849 Other hyperlipidemia: Secondary | ICD-10-CM | POA: Diagnosis not present

## 2020-10-26 DIAGNOSIS — E039 Hypothyroidism, unspecified: Secondary | ICD-10-CM | POA: Diagnosis not present

## 2020-10-27 DIAGNOSIS — Z6822 Body mass index (BMI) 22.0-22.9, adult: Secondary | ICD-10-CM | POA: Diagnosis not present

## 2020-10-27 DIAGNOSIS — M79641 Pain in right hand: Secondary | ICD-10-CM | POA: Diagnosis not present

## 2020-10-27 DIAGNOSIS — R5382 Chronic fatigue, unspecified: Secondary | ICD-10-CM | POA: Diagnosis not present

## 2020-10-27 DIAGNOSIS — M79642 Pain in left hand: Secondary | ICD-10-CM | POA: Diagnosis not present

## 2020-10-27 DIAGNOSIS — M0609 Rheumatoid arthritis without rheumatoid factor, multiple sites: Secondary | ICD-10-CM | POA: Diagnosis not present

## 2020-10-30 DIAGNOSIS — N183 Chronic kidney disease, stage 3 unspecified: Secondary | ICD-10-CM | POA: Diagnosis not present

## 2020-10-30 DIAGNOSIS — M810 Age-related osteoporosis without current pathological fracture: Secondary | ICD-10-CM | POA: Diagnosis not present

## 2020-10-30 DIAGNOSIS — E7849 Other hyperlipidemia: Secondary | ICD-10-CM | POA: Diagnosis not present

## 2020-10-30 DIAGNOSIS — I129 Hypertensive chronic kidney disease with stage 1 through stage 4 chronic kidney disease, or unspecified chronic kidney disease: Secondary | ICD-10-CM | POA: Diagnosis not present

## 2020-11-01 DIAGNOSIS — E7849 Other hyperlipidemia: Secondary | ICD-10-CM | POA: Diagnosis not present

## 2020-11-01 DIAGNOSIS — R202 Paresthesia of skin: Secondary | ICD-10-CM | POA: Diagnosis not present

## 2020-11-01 DIAGNOSIS — Z853 Personal history of malignant neoplasm of breast: Secondary | ICD-10-CM | POA: Diagnosis not present

## 2020-11-01 DIAGNOSIS — M810 Age-related osteoporosis without current pathological fracture: Secondary | ICD-10-CM | POA: Diagnosis not present

## 2020-11-01 DIAGNOSIS — M069 Rheumatoid arthritis, unspecified: Secondary | ICD-10-CM | POA: Diagnosis not present

## 2020-11-01 DIAGNOSIS — E039 Hypothyroidism, unspecified: Secondary | ICD-10-CM | POA: Diagnosis not present

## 2020-11-01 DIAGNOSIS — K5900 Constipation, unspecified: Secondary | ICD-10-CM | POA: Diagnosis not present

## 2020-12-14 DIAGNOSIS — Z6823 Body mass index (BMI) 23.0-23.9, adult: Secondary | ICD-10-CM | POA: Diagnosis not present

## 2020-12-14 DIAGNOSIS — M79642 Pain in left hand: Secondary | ICD-10-CM | POA: Diagnosis not present

## 2020-12-14 DIAGNOSIS — M0609 Rheumatoid arthritis without rheumatoid factor, multiple sites: Secondary | ICD-10-CM | POA: Diagnosis not present

## 2020-12-14 DIAGNOSIS — R5382 Chronic fatigue, unspecified: Secondary | ICD-10-CM | POA: Diagnosis not present

## 2020-12-14 DIAGNOSIS — M79641 Pain in right hand: Secondary | ICD-10-CM | POA: Diagnosis not present

## 2021-01-20 DIAGNOSIS — H353111 Nonexudative age-related macular degeneration, right eye, early dry stage: Secondary | ICD-10-CM | POA: Diagnosis not present

## 2021-01-25 DIAGNOSIS — M0609 Rheumatoid arthritis without rheumatoid factor, multiple sites: Secondary | ICD-10-CM | POA: Diagnosis not present

## 2021-01-25 DIAGNOSIS — Z23 Encounter for immunization: Secondary | ICD-10-CM | POA: Diagnosis not present

## 2021-01-25 DIAGNOSIS — M79642 Pain in left hand: Secondary | ICD-10-CM | POA: Diagnosis not present

## 2021-01-25 DIAGNOSIS — Z6823 Body mass index (BMI) 23.0-23.9, adult: Secondary | ICD-10-CM | POA: Diagnosis not present

## 2021-01-25 DIAGNOSIS — M79641 Pain in right hand: Secondary | ICD-10-CM | POA: Diagnosis not present

## 2021-01-31 DIAGNOSIS — M0609 Rheumatoid arthritis without rheumatoid factor, multiple sites: Secondary | ICD-10-CM | POA: Diagnosis not present

## 2021-02-03 DIAGNOSIS — I10 Essential (primary) hypertension: Secondary | ICD-10-CM | POA: Diagnosis not present

## 2021-02-03 DIAGNOSIS — E7849 Other hyperlipidemia: Secondary | ICD-10-CM | POA: Diagnosis not present

## 2021-02-03 DIAGNOSIS — E7800 Pure hypercholesterolemia, unspecified: Secondary | ICD-10-CM | POA: Diagnosis not present

## 2021-02-03 DIAGNOSIS — N183 Chronic kidney disease, stage 3 unspecified: Secondary | ICD-10-CM | POA: Diagnosis not present

## 2021-02-03 DIAGNOSIS — E039 Hypothyroidism, unspecified: Secondary | ICD-10-CM | POA: Diagnosis not present

## 2021-02-03 DIAGNOSIS — E782 Mixed hyperlipidemia: Secondary | ICD-10-CM | POA: Diagnosis not present

## 2021-02-03 DIAGNOSIS — E7801 Familial hypercholesterolemia: Secondary | ICD-10-CM | POA: Diagnosis not present

## 2021-02-06 DIAGNOSIS — Z20822 Contact with and (suspected) exposure to covid-19: Secondary | ICD-10-CM | POA: Diagnosis not present

## 2021-02-07 DIAGNOSIS — Z853 Personal history of malignant neoplasm of breast: Secondary | ICD-10-CM | POA: Diagnosis not present

## 2021-02-07 DIAGNOSIS — E039 Hypothyroidism, unspecified: Secondary | ICD-10-CM | POA: Diagnosis not present

## 2021-02-07 DIAGNOSIS — M810 Age-related osteoporosis without current pathological fracture: Secondary | ICD-10-CM | POA: Diagnosis not present

## 2021-02-07 DIAGNOSIS — R202 Paresthesia of skin: Secondary | ICD-10-CM | POA: Diagnosis not present

## 2021-02-07 DIAGNOSIS — E7849 Other hyperlipidemia: Secondary | ICD-10-CM | POA: Diagnosis not present

## 2021-02-07 DIAGNOSIS — M069 Rheumatoid arthritis, unspecified: Secondary | ICD-10-CM | POA: Diagnosis not present

## 2021-02-07 DIAGNOSIS — K5900 Constipation, unspecified: Secondary | ICD-10-CM | POA: Diagnosis not present

## 2021-03-06 DIAGNOSIS — M0609 Rheumatoid arthritis without rheumatoid factor, multiple sites: Secondary | ICD-10-CM | POA: Diagnosis not present

## 2021-03-22 DIAGNOSIS — M79641 Pain in right hand: Secondary | ICD-10-CM | POA: Diagnosis not present

## 2021-03-22 DIAGNOSIS — M79642 Pain in left hand: Secondary | ICD-10-CM | POA: Diagnosis not present

## 2021-03-22 DIAGNOSIS — Z6822 Body mass index (BMI) 22.0-22.9, adult: Secondary | ICD-10-CM | POA: Diagnosis not present

## 2021-03-22 DIAGNOSIS — M0609 Rheumatoid arthritis without rheumatoid factor, multiple sites: Secondary | ICD-10-CM | POA: Diagnosis not present

## 2021-04-03 DIAGNOSIS — Z20822 Contact with and (suspected) exposure to covid-19: Secondary | ICD-10-CM | POA: Diagnosis not present

## 2021-04-07 DIAGNOSIS — Z20822 Contact with and (suspected) exposure to covid-19: Secondary | ICD-10-CM | POA: Diagnosis not present

## 2021-05-01 DIAGNOSIS — M0609 Rheumatoid arthritis without rheumatoid factor, multiple sites: Secondary | ICD-10-CM | POA: Diagnosis not present

## 2021-05-01 DIAGNOSIS — Z79899 Other long term (current) drug therapy: Secondary | ICD-10-CM | POA: Diagnosis not present

## 2021-05-19 DIAGNOSIS — Z20822 Contact with and (suspected) exposure to covid-19: Secondary | ICD-10-CM | POA: Diagnosis not present

## 2021-06-06 DIAGNOSIS — Z20822 Contact with and (suspected) exposure to covid-19: Secondary | ICD-10-CM | POA: Diagnosis not present

## 2021-06-15 ENCOUNTER — Emergency Department (HOSPITAL_COMMUNITY): Payer: Medicare Other

## 2021-06-15 ENCOUNTER — Encounter (HOSPITAL_COMMUNITY): Payer: Self-pay

## 2021-06-15 ENCOUNTER — Inpatient Hospital Stay (HOSPITAL_COMMUNITY)
Admission: EM | Admit: 2021-06-15 | Discharge: 2021-06-19 | DRG: 535 | Disposition: A | Discharge status: Skilled Nursing Facility | Payer: Medicare Other | Attending: Family Medicine | Admitting: Family Medicine

## 2021-06-15 ENCOUNTER — Other Ambulatory Visit: Payer: Self-pay

## 2021-06-15 DIAGNOSIS — S32591A Other specified fracture of right pubis, initial encounter for closed fracture: Secondary | ICD-10-CM | POA: Diagnosis present

## 2021-06-15 DIAGNOSIS — Z79899 Other long term (current) drug therapy: Secondary | ICD-10-CM | POA: Diagnosis not present

## 2021-06-15 DIAGNOSIS — S0240CA Maxillary fracture, right side, initial encounter for closed fracture: Secondary | ICD-10-CM | POA: Diagnosis present

## 2021-06-15 DIAGNOSIS — W19XXXA Unspecified fall, initial encounter: Secondary | ICD-10-CM | POA: Diagnosis not present

## 2021-06-15 DIAGNOSIS — E559 Vitamin D deficiency, unspecified: Secondary | ICD-10-CM | POA: Diagnosis present

## 2021-06-15 DIAGNOSIS — S32501A Unspecified fracture of right pubis, initial encounter for closed fracture: Secondary | ICD-10-CM | POA: Diagnosis not present

## 2021-06-15 DIAGNOSIS — S32401A Unspecified fracture of right acetabulum, initial encounter for closed fracture: Secondary | ICD-10-CM | POA: Diagnosis present

## 2021-06-15 DIAGNOSIS — Z66 Do not resuscitate: Secondary | ICD-10-CM | POA: Diagnosis present

## 2021-06-15 DIAGNOSIS — I10 Essential (primary) hypertension: Secondary | ICD-10-CM | POA: Diagnosis present

## 2021-06-15 DIAGNOSIS — R279 Unspecified lack of coordination: Secondary | ICD-10-CM | POA: Diagnosis not present

## 2021-06-15 DIAGNOSIS — E039 Hypothyroidism, unspecified: Secondary | ICD-10-CM | POA: Diagnosis not present

## 2021-06-15 DIAGNOSIS — A419 Sepsis, unspecified organism: Secondary | ICD-10-CM

## 2021-06-15 DIAGNOSIS — Z7982 Long term (current) use of aspirin: Secondary | ICD-10-CM

## 2021-06-15 DIAGNOSIS — R262 Difficulty in walking, not elsewhere classified: Secondary | ICD-10-CM | POA: Diagnosis not present

## 2021-06-15 DIAGNOSIS — K5903 Drug induced constipation: Secondary | ICD-10-CM | POA: Diagnosis not present

## 2021-06-15 DIAGNOSIS — Y92007 Garden or yard of unspecified non-institutional (private) residence as the place of occurrence of the external cause: Secondary | ICD-10-CM

## 2021-06-15 DIAGNOSIS — M25511 Pain in right shoulder: Secondary | ICD-10-CM | POA: Diagnosis not present

## 2021-06-15 DIAGNOSIS — S32501D Unspecified fracture of right pubis, subsequent encounter for fracture with routine healing: Secondary | ICD-10-CM | POA: Diagnosis not present

## 2021-06-15 DIAGNOSIS — Z79890 Hormone replacement therapy (postmenopausal): Secondary | ICD-10-CM | POA: Diagnosis not present

## 2021-06-15 DIAGNOSIS — S329XXA Fracture of unspecified parts of lumbosacral spine and pelvis, initial encounter for closed fracture: Secondary | ICD-10-CM | POA: Diagnosis present

## 2021-06-15 DIAGNOSIS — W1789XA Other fall from one level to another, initial encounter: Secondary | ICD-10-CM | POA: Diagnosis present

## 2021-06-15 DIAGNOSIS — M25551 Pain in right hip: Secondary | ICD-10-CM | POA: Diagnosis not present

## 2021-06-15 DIAGNOSIS — Z85038 Personal history of other malignant neoplasm of large intestine: Secondary | ICD-10-CM

## 2021-06-15 DIAGNOSIS — S32411A Displaced fracture of anterior wall of right acetabulum, initial encounter for closed fracture: Secondary | ICD-10-CM | POA: Diagnosis not present

## 2021-06-15 DIAGNOSIS — S0240CD Maxillary fracture, right side, subsequent encounter for fracture with routine healing: Secondary | ICD-10-CM | POA: Diagnosis not present

## 2021-06-15 DIAGNOSIS — Z79631 Long term (current) use of antimetabolite agent: Secondary | ICD-10-CM | POA: Diagnosis not present

## 2021-06-15 DIAGNOSIS — S0511XA Contusion of eyeball and orbital tissues, right eye, initial encounter: Secondary | ICD-10-CM | POA: Diagnosis present

## 2021-06-15 DIAGNOSIS — Z9049 Acquired absence of other specified parts of digestive tract: Secondary | ICD-10-CM

## 2021-06-15 DIAGNOSIS — Z9012 Acquired absence of left breast and nipple: Secondary | ICD-10-CM

## 2021-06-15 DIAGNOSIS — S32511D Fracture of superior rim of right pubis, subsequent encounter for fracture with routine healing: Secondary | ICD-10-CM | POA: Diagnosis not present

## 2021-06-15 DIAGNOSIS — Z9181 History of falling: Secondary | ICD-10-CM | POA: Diagnosis not present

## 2021-06-15 DIAGNOSIS — M25559 Pain in unspecified hip: Secondary | ICD-10-CM | POA: Diagnosis not present

## 2021-06-15 DIAGNOSIS — M069 Rheumatoid arthritis, unspecified: Secondary | ICD-10-CM | POA: Diagnosis not present

## 2021-06-15 DIAGNOSIS — B9620 Unspecified Escherichia coli [E. coli] as the cause of diseases classified elsewhere: Secondary | ICD-10-CM | POA: Diagnosis present

## 2021-06-15 DIAGNOSIS — Z043 Encounter for examination and observation following other accident: Secondary | ICD-10-CM | POA: Diagnosis not present

## 2021-06-15 DIAGNOSIS — M79604 Pain in right leg: Secondary | ICD-10-CM | POA: Diagnosis not present

## 2021-06-15 DIAGNOSIS — N3000 Acute cystitis without hematuria: Secondary | ICD-10-CM | POA: Diagnosis present

## 2021-06-15 DIAGNOSIS — N390 Urinary tract infection, site not specified: Secondary | ICD-10-CM | POA: Diagnosis not present

## 2021-06-15 DIAGNOSIS — R1312 Dysphagia, oropharyngeal phase: Secondary | ICD-10-CM | POA: Diagnosis not present

## 2021-06-15 DIAGNOSIS — S32401D Unspecified fracture of right acetabulum, subsequent encounter for fracture with routine healing: Secondary | ICD-10-CM | POA: Diagnosis not present

## 2021-06-15 DIAGNOSIS — M47812 Spondylosis without myelopathy or radiculopathy, cervical region: Secondary | ICD-10-CM | POA: Diagnosis not present

## 2021-06-15 DIAGNOSIS — Z853 Personal history of malignant neoplasm of breast: Secondary | ICD-10-CM

## 2021-06-15 DIAGNOSIS — R000 Tachycardia, unspecified: Secondary | ICD-10-CM | POA: Diagnosis not present

## 2021-06-15 DIAGNOSIS — S0285XA Fracture of orbit, unspecified, initial encounter for closed fracture: Secondary | ICD-10-CM

## 2021-06-15 DIAGNOSIS — Z7401 Bed confinement status: Secondary | ICD-10-CM | POA: Diagnosis not present

## 2021-06-15 DIAGNOSIS — M546 Pain in thoracic spine: Secondary | ICD-10-CM | POA: Diagnosis not present

## 2021-06-15 LAB — CBC WITH DIFFERENTIAL/PLATELET
Abs Immature Granulocytes: 0.15 10*3/uL — ABNORMAL HIGH (ref 0.00–0.07)
Basophils Absolute: 0 10*3/uL (ref 0.0–0.1)
Basophils Relative: 0 %
Eosinophils Absolute: 0 10*3/uL (ref 0.0–0.5)
Eosinophils Relative: 0 %
HCT: 34.7 % — ABNORMAL LOW (ref 36.0–46.0)
Hemoglobin: 10.6 g/dL — ABNORMAL LOW (ref 12.0–15.0)
Immature Granulocytes: 1 %
Lymphocytes Relative: 5 %
Lymphs Abs: 0.8 10*3/uL (ref 0.7–4.0)
MCH: 29.9 pg (ref 26.0–34.0)
MCHC: 30.5 g/dL (ref 30.0–36.0)
MCV: 98 fL (ref 80.0–100.0)
Monocytes Absolute: 1.2 10*3/uL — ABNORMAL HIGH (ref 0.1–1.0)
Monocytes Relative: 8 %
Neutro Abs: 12.2 10*3/uL — ABNORMAL HIGH (ref 1.7–7.7)
Neutrophils Relative %: 86 %
Platelets: 203 10*3/uL (ref 150–400)
RBC: 3.54 MIL/uL — ABNORMAL LOW (ref 3.87–5.11)
RDW: 16 % — ABNORMAL HIGH (ref 11.5–15.5)
WBC: 14.4 10*3/uL — ABNORMAL HIGH (ref 4.0–10.5)
nRBC: 0 % (ref 0.0–0.2)

## 2021-06-15 LAB — URINALYSIS, ROUTINE W REFLEX MICROSCOPIC
Bilirubin Urine: NEGATIVE
Glucose, UA: NEGATIVE mg/dL
Ketones, ur: NEGATIVE mg/dL
Nitrite: NEGATIVE
Protein, ur: 30 mg/dL — AB
Specific Gravity, Urine: 1.014 (ref 1.005–1.030)
WBC, UA: >50 WBC/hpf — ABNORMAL HIGH (ref 0–5)
pH: 8 (ref 5.0–8.0)

## 2021-06-15 LAB — COMPREHENSIVE METABOLIC PANEL
ALT: 17 U/L (ref 0–44)
AST: 24 U/L (ref 15–41)
Albumin: 3.7 g/dL (ref 3.5–5.0)
Alkaline Phosphatase: 70 U/L (ref 38–126)
Anion gap: 9 (ref 5–15)
BUN: 16 mg/dL (ref 8–23)
CO2: 24 mmol/L (ref 22–32)
Calcium: 9.1 mg/dL (ref 8.9–10.3)
Chloride: 108 mmol/L (ref 98–111)
Creatinine, Ser: 1.01 mg/dL — ABNORMAL HIGH (ref 0.44–1.00)
GFR, Estimated: 55 mL/min — ABNORMAL LOW (ref >60)
Glucose, Bld: 124 mg/dL — ABNORMAL HIGH (ref 70–99)
Potassium: 3.7 mmol/L (ref 3.5–5.1)
Sodium: 141 mmol/L (ref 135–145)
Total Bilirubin: 0.6 mg/dL (ref 0.3–1.2)
Total Protein: 6.7 g/dL (ref 6.5–8.1)

## 2021-06-15 LAB — LACTIC ACID, PLASMA: Lactic Acid, Venous: 1.3 mmol/L (ref 0.5–1.9)

## 2021-06-15 MED ORDER — FENTANYL CITRATE PF 50 MCG/ML IJ SOSY
50.0000 ug | PREFILLED_SYRINGE | Freq: Once | INTRAMUSCULAR | Status: AC
  Administered 2021-06-15: 50 ug via INTRAVENOUS
  Filled 2021-06-15: qty 1

## 2021-06-15 MED ORDER — ONDANSETRON HCL 4 MG/2ML IJ SOLN
4.0000 mg | Freq: Four times a day (QID) | INTRAMUSCULAR | Status: DC | PRN
Start: 2021-06-15 — End: 2021-06-19
  Administered 2021-06-17: 4 mg via INTRAVENOUS
  Filled 2021-06-15: qty 2

## 2021-06-15 MED ORDER — SODIUM CHLORIDE 0.9 % IV SOLN: INTRAVENOUS | Status: AC

## 2021-06-15 MED ORDER — OYSTER SHELL CALCIUM/D3 500-5 MG-MCG PO TABS
2.0000 | ORAL_TABLET | Freq: Two times a day (BID) | ORAL | Status: DC
  Administered 2021-06-15 – 2021-06-19 (×8): 2 via ORAL
  Filled 2021-06-15 (×8): qty 2

## 2021-06-15 MED ORDER — LEVOTHYROXINE SODIUM 88 MCG PO TABS
88.0000 ug | ORAL_TABLET | Freq: Every day | ORAL | Status: DC
  Administered 2021-06-16 – 2021-06-18 (×3): 88 ug via ORAL
  Filled 2021-06-15 (×4): qty 1

## 2021-06-15 MED ORDER — DEXTROSE 5 % IV SOLN
500.0000 mg | Freq: Four times a day (QID) | INTRAVENOUS | Status: DC | PRN
  Filled 2021-06-15: qty 5

## 2021-06-15 MED ORDER — HYDROCODONE-ACETAMINOPHEN 5-325 MG PO TABS
1.0000 | ORAL_TABLET | ORAL | Status: DC | PRN
  Administered 2021-06-16 (×2): 2 via ORAL
  Filled 2021-06-15 (×2): qty 2

## 2021-06-15 MED ORDER — ONDANSETRON HCL 4 MG PO TABS: 4.0000 mg | ORAL_TABLET | Freq: Four times a day (QID) | ORAL | Status: DC | PRN

## 2021-06-15 MED ORDER — SENNA 8.6 MG PO TABS
1.0000 | ORAL_TABLET | Freq: Two times a day (BID) | ORAL | Status: DC
  Administered 2021-06-15 – 2021-06-18 (×7): 8.6 mg via ORAL
  Filled 2021-06-15 (×7): qty 1

## 2021-06-15 MED ORDER — MORPHINE SULFATE (PF) 2 MG/ML IV SOLN
1.0000 mg | INTRAVENOUS | Status: DC | PRN
  Administered 2021-06-15 – 2021-06-19 (×6): 1 mg via INTRAVENOUS
  Filled 2021-06-15 (×6): qty 1

## 2021-06-15 MED ORDER — ACETAMINOPHEN 325 MG PO TABS: 325.0000 mg | ORAL_TABLET | Freq: Four times a day (QID) | ORAL | Status: DC | PRN

## 2021-06-15 MED ORDER — SODIUM CHLORIDE 0.9 % IV SOLN: INTRAVENOUS | Status: DC

## 2021-06-15 MED ORDER — FOLIC ACID 1 MG PO TABS
1.0000 mg | ORAL_TABLET | Freq: Every day | ORAL | Status: DC
  Administered 2021-06-16 – 2021-06-19 (×4): 1 mg via ORAL
  Filled 2021-06-15 (×4): qty 1

## 2021-06-15 MED ORDER — SODIUM CHLORIDE 0.9 % IV SOLN
1.0000 g | INTRAVENOUS | Status: DC
  Administered 2021-06-15 – 2021-06-18 (×4): 1 g via INTRAVENOUS
  Filled 2021-06-15 (×4): qty 10

## 2021-06-15 MED ORDER — ACETAMINOPHEN 650 MG RE SUPP: 650.0000 mg | Freq: Four times a day (QID) | RECTAL | Status: DC | PRN

## 2021-06-15 MED ORDER — RALOXIFENE HCL 60 MG PO TABS
60.0000 mg | ORAL_TABLET | Freq: Every day | ORAL | Status: DC
  Administered 2021-06-16 – 2021-06-19 (×4): 60 mg via ORAL
  Filled 2021-06-15 (×4): qty 1

## 2021-06-15 MED ORDER — ENOXAPARIN SODIUM 40 MG/0.4ML IJ SOSY
40.0000 mg | PREFILLED_SYRINGE | INTRAMUSCULAR | Status: DC
  Administered 2021-06-16 – 2021-06-18 (×3): 40 mg via SUBCUTANEOUS
  Filled 2021-06-15 (×3): qty 0.4

## 2021-06-15 MED ORDER — VITAMIN B-12 100 MCG PO TABS
250.0000 ug | ORAL_TABLET | Freq: Every day | ORAL | Status: DC
  Administered 2021-06-16 – 2021-06-19 (×4): 250 ug via ORAL
  Filled 2021-06-15 (×4): qty 3

## 2021-06-15 NOTE — H&P (Signed)
? ?                                                                                                       [facility] H&P ? ? Patient Demographics:  ? ? Wanda Cohen, is a 87 y.o. female  MRN: [id number]   DOB - Mar 06, 1935 ? ?Admit Date - 06/15/2021 ? ?Outpatient Primary MD for the patient is Sasser, Silvestre Moment, MD ? ?Referring MD/NP/PA: Dr Sabra Heck   ? ?Patient coming from: Home ? ?Chief Complaint  ?Patient presents with  ? Fall  ?  ? ? HPI:  ? ? Wanda Cohen  is a 87 y.o. female, with past medical history of rheumatoid arthritis, on methotrexate and other immunomodulators, and hypothyroidism no other significant history, she presents to ED secondary to fall, she lives by herself, she was walking in her back porch, which is approximately 4 stairs where she got to the top, felt a little bit dizzy and fell over the porch landing on her right side including right hip and forearm, and right side of the face which was bruised, she denies any loss of consciousness, focal deficits, patient is not on any anticoagulation, she does report dysuria and polyuria for last few days.. ?-In ED her work-up significant for positive UA, and white blood cell count was elevated at 14.4 imaging significant for right maxillary sinus fracture, and hip x-ray significant for  acute mildly displaced fracture of the junction of the right ?acetabulum and superior pubic ramus, both ENT and orthopedic recommended operative management, [facility] hospitalist consulted to admit ?  ? ? ? Review of systems:  ?  ?In addition to the HPI above,  ? ?A full 10 point Review of Systems was done, except as stated above, all other Review of Systems were negative. ? ? ?With Past History of the following :  ? ? ?Past Medical History:  ?Diagnosis Date  ? Colon cancer, ascending ([place])   ? hx of  ? Hx of breast cancer   ? Hypertension   ? Hypothyroidism   ?   ? ?Past Surgical History:  ?Procedure Laterality Date  ? COLONOSCOPY N/A 12/11/2012  ? Procedure: COLONOSCOPY;   Surgeon: Rogene Houston, MD;  Location: [facility];  Service: Endoscopy;  Laterality: N/A;  1030  ? COLONOSCOPY N/A 07/09/2017  ? Procedure: COLONOSCOPY;  Surgeon: Aviva Signs, MD;  Location: [facility];  Service: Gastroenterology;  Laterality: N/A;  ? MASTECTOMY Left   ? RIGHT COLECTOMY    ? ? ? ? Social History:  ? ?  ?Social History  ? ?Tobacco Use  ? Smoking status: Never  ? Smokeless tobacco: Never  ?Substance Use Topics  ? Alcohol use: No  ?  ? ? ? ? Family History :  ? ?  ?Family History  ?Problem Relation Age of Onset  ? Colon cancer Neg Hx   ? ? ? ? Home Medications:  ? ?Prior to Admission medications   ?Medication Sig Start Date End Date Taking? Authorizing Provider  ?Calcium-Vitamin A-Vitamin D (LIQUID CALCIUM PO) Take by mouth.  Yes [provider]  ?folic acid (FOLVITE) 1 MG tablet Take 1 mg by mouth daily. 05/25/21  Yes [provider]  ?golimumab ([place]unn ARIA) 50 MG/4ML SOLN injection '2mg'$ /kg   Yes [provider]  ?ibuprofen (ADVIL) 200 MG tablet Take 200 mg by mouth daily as needed for moderate pain.   Yes [provider]  ?Ibuprofen-diphenhydrAMINE Cit (ADVIL PM PO) Take 2 tablets by mouth at bedtime as needed (for sleep).   Yes [provider]  ?levothyroxine (SYNTHROID, LEVOTHROID) 88 MCG tablet Take 88 mcg by mouth daily before breakfast.   Yes [provider]  ?methotrexate 2.5 MG tablet Take by mouth See admin instructions. Take 7 tablets weekly on the same day. Sunday   Yes [provider]  ?raloxifene (EVISTA) 60 MG tablet Take 60 mg by mouth daily.   Yes [provider]  ?vitamin B-12 (CYANOCOBALAMIN) 100 MCG tablet See admin instructions.   Yes [provider]  ? ? ? Allergies:  ? ? No Known Allergies ? ? Physical Exam:  ? ?Vitals ? ?Blood pressure (!) 144/83, pulse (!) 107, temperature 98.8 ?F (37.1 ?C), temperature source Oral, resp. rate 17, height '5\' 4"'$  (1.626 m), weight 60.3 kg, SpO2 98 %. ? ? ?1.  General *well developed female, laying in bed in no apparent distress ? ?2. Normal affect and insight, Not Suicidal or Homicidal, Awake Alert, Oriented X 3. ? ?3. No F.N deficits, ALL C.Nerves Intact, Strength 5/5 all 4 extremities, Sensation intact all 4 extremities, Plantars down going. ? ?4. Ears and Eyes appear Normal, Conjunctivae clear, PERRLA.  Right periorbital bruise moist Oral Mucosa. ? ?5. Supple Neck, No JVD, No cervical lymphadenopathy appriciated, No Carotid Bruits. ? ?6. Symmetrical Chest wall movement, Good air movement bilaterally, CTAB. ? ?7. RRR, No Gallops, Rubs or Murmurs, No Parasternal Heave. ? ?8. Positive Bowel Sounds, Abdomen Soft, No tenderness, No organomegaly appriciated,No rebound -guarding or rigidity. ? ?9.  No Cyanosis, Normal Skin Turgor, No Skin Rash or Bruise. ? ?10. Good muscle tone,  joints appear normal , no effusions, lower extremity range of motion limited due to pain ? ?11. No Palpable Lymph Nodes in Neck or Axillae ? ? ? ? Data Review:  ? ? CBC ?Recent Labs  ?Lab 06/15/21 ?4097  ?WBC 14.4*  ?HGB 10.6*  ?HCT 34.7*  ?PLT 203  ?MCV 98.0  ?MCH 29.9  ?MCHC 30.5  ?RDW 16.0*  ?LYMPHSABS 0.8  ?MONOABS 1.2*  ?EOSABS 0.0  ?BASOSABS 0.0  ? ?------------------------------------------------------------------------------------------------------------------ ? ?Chemistries  ?Recent Labs  ?Lab 06/15/21 ?3532  ?NA 141  ?K 3.7  ?CL 108  ?CO2 24  ?GLUCOSE 124*  ?BUN 16  ?CREATININE 1.01*  ?CALCIUM 9.1  ?AST 24  ?ALT 17  ?ALKPHOS 70  ?BILITOT 0.6  ? ?------------------------------------------------------------------------------------------------------------------ ?estimated creatinine clearance is 35.2 mL/min (A) (by C-G formula based on SCr of 1.01 mg/dL (H)). ?------------------------------------------------------------------------------------------------------------------ ?No results for input(s): TSH, T4TOTAL, T3FREE, THYROIDAB in the last 72 hours. ? ?Invalid input(s):  FREET3 ? ?Coagulation profile ?No results for input(s): INR, PROTIME in the last 168 hours. ?------------------------------------------------------------------------------------------------------------------- ?No results for input(s): DDIMER in the last 72 hours. ?------------------------------------------------------------------------------------------------------------------- ? ?Cardiac Enzymes ?No results for input(s): CKMB, TROPONINI, MYOGLOBIN in the last 168 hours. ? ?Invalid input(s): CK ?------------------------------------------------------------------------------------------------------------------ ?No results found for: BNP ? ? ?--------------------------------------------------------------------------------------------------------------- ? ?Urinalysis ?   ?Component Value Date/Time  ? [place] YELLOW 06/15/2021 1841  ? APPEARANCEUR HAZY (A) 06/15/2021 1841  ? LABSPEC 1.014 06/15/2021 1841  ? PHURINE 8.0  06/15/2021 1841  ? [place] NEGATIVE 06/15/2021 1841  ? HGBUR MODERATE (A) 06/15/2021 1841  ? [place] NEGATIVE 06/15/2021 1841  ? [place] NEGATIVE 06/15/2021 1841  ? PROTEINUR 30 (A) 06/15/2021 1841  ? NITRITE NEGATIVE 06/15/2021 1841  ? LEUKOCYTESUR MODERATE (A) 06/15/2021 1841  ? ? ?---------------------------------------------------------------------------------------------------------------- ? ? Imaging Results:  ? ? DG Thoracic Spine 2 View ? ?Result Date: 06/15/2021 ?CLINICAL DATA:  [place] from [employer]  Back pain. EXAM: THORACIC SPINE 2 VIEWS COMPARISON:  None. FINDINGS: Mild thoracolumbar scoliotic curvature. No thoracic region fracture is visible. Question failure of separation at T6 and T7. IMPRESSION: Scoliosis.  No acute fracture visible by radiography. Electronically Signed   By: Nelson Chimes M.D.   On: 06/15/2021 16:59  ? ?CT Head Wo Contrast ? ?Result Date: 06/15/2021 ?CLINICAL DATA:  Status post fall. EXAM: CT HEAD WITHOUT CONTRAST TECHNIQUE: Contiguous axial images were obtained from  the base of the skull through the vertex without intravenous contrast. RADIATION DOSE REDUCTION: This exam was performed according to the departmental dose-optimization program which includes automated exposure control, adjustmen

## 2021-06-15 NOTE — Consult Note (Signed)
Reason for Consult: pelvic fracture  Referring Physician: Dr Standley Dakins   Wanda Cohen is an 87 y.o. female.  HPI: 87 year old female very active normally ambulates on her own fell in the yard struggle to get to the front porch and then fell off the porch landing on the right side she sustained an injury to her face and then an injury to her right hemipelvis  She complains of pain in the right hip area painful range of motion painful weightbearing pain is nonradiating but can be severe depending on motion  Past Medical History:  Diagnosis Date   Colon cancer, ascending (HCC)    hx of   Hx of breast cancer    Hypertension    Hypothyroidism     Past Surgical History:  Procedure Laterality Date   COLONOSCOPY N/A 12/11/2012   Procedure: COLONOSCOPY;  Surgeon: Malissa Hippo, MD;  [facility];  Service: Endoscopy;  Laterality: N/A;  1030   COLONOSCOPY N/A 07/09/2017   Procedure: COLONOSCOPY;  Surgeon: Franky Macho, MD;  Location: [facility];  Service: Gastroenterology;  Laterality: N/A;   MASTECTOMY Left    RIGHT COLECTOMY      Family History  Problem Relation Age of Onset   Colon cancer Neg Hx     Social History:  reports that she has never smoked. She has never used smokeless tobacco. She reports that she does not drink alcohol and does not use drugs.  Allergies: No Known Allergies  Medications: Scheduled:  calcium-vitamin D  2 tablet Oral BID   enoxaparin (LOVENOX) injection  40 mg Subcutaneous Q24H   folic acid  1 mg Oral Daily   levothyroxine  88 mcg Oral QAC breakfast   raloxifene  60 mg Oral Daily   senna  1 tablet Oral BID   vitamin B-12  250 mcg Oral Daily    Results for orders placed or performed during the hospital encounter of 06/15/21 (from the past 48 hour(s))  Comprehensive metabolic panel     Status: Abnormal   Collection Time: 06/15/21  4:59 PM  Result Value Ref Range   Sodium 141 135 - 145 mmol/L   Potassium 3.7 3.5 - 5.1 mmol/L    Chloride 108 98 - 111 mmol/L   CO2 24 22 - 32 mmol/L   Glucose, Bld 124 (H) 70 - 99 mg/dL    Comment: Glucose reference range applies only to samples taken after fasting for at least 8 hours.   BUN 16 8 - 23 mg/dL   Creatinine, Ser 2.13 (H) 0.44 - 1.00 mg/dL   Calcium 9.1 8.9 - 08.6 mg/dL   Total Protein 6.7 6.5 - 8.1 g/dL   Albumin 3.7 3.5 - 5.0 g/dL   AST 24 15 - 41 U/L   ALT 17 0 - 44 U/L   Alkaline Phosphatase 70 38 - 126 U/L   Total Bilirubin 0.6 0.3 - 1.2 mg/dL   GFR, Estimated 55 (L) >60 mL/min    Comment: (NOTE) Calculated using the CKD-EPI Creatinine Equation (2021)    Anion gap 9 5 - 15    Comment: Performed at [facility], [address], [place], [place] [address]  CBC with Differential     Status: Abnormal   Collection Time: 06/15/21  4:59 PM  Result Value Ref Range   WBC 14.4 (H) 4.0 - 10.5 K/uL   RBC 3.54 (L) 3.87 - 5.11 MIL/uL   Hemoglobin 10.6 (L) 12.0 - 15.0 g/dL   HCT 96.2 (L)  36.0 - 46.0 %   MCV 98.0 80.0 - 100.0 fL   MCH 29.9 26.0 - 34.0 pg   MCHC 30.5 30.0 - 36.0 g/dL   RDW 40.9 (H) 81.1 - 91.4 %   Platelets 203 150 - 400 K/uL   nRBC 0.0 0.0 - 0.2 %   Neutrophils Relative % 86 %   Neutro Abs 12.2 (H) 1.7 - 7.7 K/uL   Lymphocytes Relative 5 %   Lymphs Abs 0.8 0.7 - 4.0 K/uL   Monocytes Relative 8 %   Monocytes Absolute 1.2 (H) 0.1 - 1.0 K/uL   Eosinophils Relative 0 %   Eosinophils Absolute 0.0 0.0 - 0.5 K/uL   Basophils Relative 0 %   Basophils Absolute 0.0 0.0 - 0.1 K/uL   Immature Granulocytes 1 %   Abs Immature Granulocytes 0.15 (H) 0.00 - 0.07 K/uL    Comment: Performed at [facility], [address] [place], [place] [address]  Lactic acid, plasma     Status: None   Collection Time: 06/15/21  4:59 PM  Result Value Ref Range   Lactic Acid, Venous 1.3 0.5 - 1.9 mmol/L    Comment: Performed at [facility], [address], [place], [place] [address]  Urinalysis, Routine w reflex microscopic Urine, Catheterized     Status: Abnormal    Collection Time: 06/15/21  6:41 PM  Result Value Ref Range   Color, Urine YELLOW YELLOW   APPearance HAZY (A) CLEAR   Specific Gravity, Urine 1.014 1.005 - 1.030   pH 8.0 5.0 - 8.0   Glucose, UA NEGATIVE NEGATIVE mg/dL   Hgb urine dipstick MODERATE (A) NEGATIVE   Bilirubin Urine NEGATIVE NEGATIVE   Ketones, ur NEGATIVE NEGATIVE mg/dL   Protein, ur 30 (A) NEGATIVE mg/dL   Nitrite NEGATIVE NEGATIVE   Leukocytes,Ua MODERATE (A) NEGATIVE   RBC / HPF 6-10 0 - 5 RBC/hpf   WBC, UA >50 (H) 0 - 5 WBC/hpf   Bacteria, UA RARE (A) NONE SEEN   Squamous Epithelial / LPF 0-5 0 - 5    Comment: Performed at [facility], [address] [place], [place] [address]  Blood culture (routine x 2)     Status: None (Preliminary result)   Collection Time: 06/15/21  7:32 PM   Specimen: BLOOD RIGHT ARM  Result Value Ref Range   Specimen Description BLOOD RIGHT ARM    Special Requests      BOTTLES DRAWN AEROBIC AND ANAEROBIC Blood Culture results may not be optimal due to an inadequate volume of blood received in culture bottles Performed at [facility], [address], [place], [place] [address]    Culture PENDING    Report Status PENDING   Blood culture (routine x 2)     Status: None (Preliminary result)   Collection Time: 06/15/21  7:32 PM   Specimen: BLOOD RIGHT HAND  Result Value Ref Range   Specimen Description BLOOD RIGHT HAND    Special Requests      BOTTLES DRAWN AEROBIC AND ANAEROBIC Blood Culture adequate volume Performed at [facility], [address] [place], [place] [address]    Culture PENDING    Report Status PENDING     DG Thoracic Spine 2 View  Result Date: 06/15/2021 CLINICAL DATA:  Larey Seat from porch.  Back pain. EXAM: THORACIC SPINE 2 VIEWS COMPARISON:  None. FINDINGS: Mild thoracolumbar scoliotic curvature. No thoracic region fracture is visible. Question failure of separation at T6 and T7. IMPRESSION: Scoliosis.  No acute fracture visible  by radiography. Electronically Signed    By: Paulina Fusi M.D.   On: 06/15/2021 16:59   CT Head Wo Contrast  Result Date: 06/15/2021 CLINICAL DATA:  Status post fall. EXAM: CT HEAD WITHOUT CONTRAST TECHNIQUE: Contiguous axial images were obtained from the base of the skull through the vertex without intravenous contrast. RADIATION DOSE REDUCTION: This exam was performed according to the departmental dose-optimization program which includes automated exposure control, adjustment of the mA and/or kV according to patient size and/or use of iterative reconstruction technique. COMPARISON:  None. FINDINGS: Brain: There is mild cerebral atrophy with widening of the extra-axial spaces and ventricular dilatation. There are areas of decreased attenuation within the white matter tracts of the supratentorial brain, consistent with microvascular disease changes. Vascular: No hyperdense vessel or unexpected calcification. Skull: Normal. Negative for fracture or focal lesion. Sinuses/Orbits: Acute fracture deformities are seen along the anterior and posterior walls of the right maxillary sinus. A large right maxillary sinus air-fluid level is also noted. Other: A small amount of soft tissue air seen along the anterior wall the right maxillary sinus. IMPRESSION: 1. Acute fracture deformities along the anterior and posterior walls of the right maxillary sinus. 2. Generalized cerebral atrophy. 3. No acute intracranial abnormality. Electronically Signed   By: Aram Candela M.D.   On: 06/15/2021 16:58   CT Cervical Spine Wo Contrast  Result Date: 06/15/2021 CLINICAL DATA:  Larey Seat from the porch.  Right facial fractures. EXAM: CT CERVICAL SPINE WITHOUT CONTRAST TECHNIQUE: Multidetector CT imaging of the cervical spine was performed without intravenous contrast. Multiplanar CT image reconstructions were also generated. RADIATION DOSE REDUCTION: This exam was performed according to the departmental dose-optimization program which includes automated exposure control,  adjustment of the mA and/or kV according to patient size and/or use of iterative reconstruction technique. COMPARISON:  None. FINDINGS: Alignment: No malalignment. Skull base and vertebrae: No regional fracture. Soft tissues and spinal canal: No traumatic soft tissue finding in the neck. Disc levels: The foramen magnum is widely patent. There is ordinary mild osteoarthritis of the C1-2 articulation but no encroachment upon the neural structures. Ordinary mild degenerative spondylosis from C3-4 through C6-7. Mild bony foraminal narrowing at those levels without evidence of severe disease. Upper chest: Scarring at the lung apices. Other: None IMPRESSION: No acute or traumatic cervical finding.  Ordinary spondylosis. Electronically Signed   By: Paulina Fusi M.D.   On: 06/15/2021 17:04   DG Hip Unilat W or Wo Pelvis 2-3 Views Right  Result Date: 06/15/2021 CLINICAL DATA:  Trauma.  Fell from [employer].  Right hip and back pain. EXAM: DG HIP (WITH OR WITHOUT PELVIS) 2-3V RIGHT COMPARISON:  None. FINDINGS: There is diffuse decreased bone mineralization. Mild-to-moderate bilateral femoroacetabular joint space narrowing. There is curvilinear lucency at the junction of the right acetabulum and superior pubic ramus, a fracture with up to 6 mm lateral displacement of the superior pubic ramus component. There is additional lucency overlying the more medial aspect of the right superior pubic ramus on frontal view that extends outside the bone and is favored to represent overlying soft tissues. This lucency is not replicated overlying the bone on the other views. Moderate pubic symphysis joint space narrowing. Moderate to severe right L3-4 and moderate left L4-5 disc space narrowing. IMPRESSION: Acute mildly displaced fracture of the junction of the right acetabulum and superior pubic ramus. Electronically Signed   By: Neita Garnet M.D.   On: 06/15/2021 16:56   CT Maxillofacial Wo Contrast  Result Date: 06/15/2021  CLINICAL  DATA:  Larey Seat from [employer].  Bruising of the right eye. EXAM: CT MAXILLOFACIAL WITHOUT CONTRAST TECHNIQUE: Multidetector CT imaging of the maxillofacial structures was performed. Multiplanar CT image reconstructions were also generated. RADIATION DOSE REDUCTION: This exam was performed according to the departmental dose-optimization program which includes automated exposure control, adjustment of the mA and/or kV according to patient size and/or use of iterative reconstruction technique. COMPARISON:  None. FINDINGS: Osseous: Nondisplaced fractures of the anterior and lateral walls of the right maxillary sinus. No visible orbital floor fracture. No visible fracture of the zygomatic arch. No other facial fracture. Orbits: No intraorbital injury.  Previous lens implants. Sinuses: Traumatic fluid layering within the right maxillary sinus. Other sinuses are clear. Soft tissues: No large hematoma.  Mild swelling of the right cheek. Limited intracranial: Negative IMPRESSION: Nondisplaced fractures of the anterior and lateral walls of the right maxillary sinus. Blood/layering fluid in the right maxillary sinus. No other facial fracture. Electronically Signed   By: Paulina Fusi M.D.   On: 06/15/2021 17:02    Review of Systems  She says she got dizzy  She has not been sick  No bowel or bladder dysfunction  No numbness or tingling Blood pressure (!) 144/83, pulse (!) 107, temperature 98.8 F (37.1 C), temperature source Oral, resp. rate 17, height 5\' 4"  (1.626 m), weight 60.3 kg, SpO2 98 %. Physical Exam  Normal development grooming and hygiene  Mood and affect normal  Awake alert and oriented x3  Gait cannot ambulate without pain and cannot stand on the right leg  Lower extremity skin is normal there is an abrasion over the right knee which is very superficial and small there is an abrasion also noted on the right forearm  Lower extremity exam continued  Right leg tenderness to palpation in the proximal  hip and groin area normal on the left Normal motor exam both lower extremities All joints reduced both lower extremities Normal range of motion left lower extremity decreased hip range of motion right lower extremity    Assessment/Plan: My assessment of the x-rays: X-ray shows a sort of superior pubic ramus fracture but is very close to the acetabulum  Recommend CT scan to confirm that the patient will be able to weight-bear  CT confirms stable pelvic fracture and minimal acetabular involvement this patient can be weightbearing as tolerated  X-ray in 6 weeks in the office  [employer] 06/15/2021, 11:02 PM

## 2021-06-15 NOTE — ED Provider Notes (Signed)
[facility] EMERGENCY DEPARTMENT Provider Note   CSN: [id number] Arrival date & time: 06/15/21  1408     History  Chief Complaint  Patient presents with   Wanda Cohen is a 87 y.o. female.   Fall   This patient is a very healthy 87 year old female, she is treated for rheumatoid arthritis with methotrexate and other immune modulators.  She denies history of hypertension to diabetes, she does have hypothyroidism.  She currently lives by herself, she was walking up her back porch which is approximately 4 stairs where she got to the top, felt a little bit dizzy and fell off the porch landing on her right side including her right hip her right forearm and the right side of her face which was contused.  This patient does not take any anticoagulants.  She reports that she could not get up off of the ground because of pain in her right hip, she pushed her medical alert bracelet which caused the ambulance to be activated who brings her to the hospital.  She has no other symptoms at this time including chest pain coughing or shortness of breath.  She does report that she has had some urinary frequency getting up about 6 times per night to urinate.  Of note the patient does present tachycardic.  Home Medications Prior to Admission medications   Medication Sig Start Date End Date Taking? Authorizing Provider  acetaminophen (TYLENOL) 325 MG tablet 1 tablet as needed    [provider]  aspirin EC 81 MG tablet Take 81 mg by mouth daily.    [provider]  calcium carbonate (OSCAL) 1500 (600 Ca) MG TABS tablet Take 1 tablet by mouth daily.    [provider]  Calcium Carbonate-Vitamin D (CALCIUM-VITAMIN D3 PO) Take 1 tablet by mouth daily.    [provider]  Calcium Citrate-Vitamin D 315-5 MG-MCG TABS Take by mouth.    [provider]  folic acid (FOLVITE) 1 MG tablet Take 1 mg by mouth daily. 05/25/21   [provider]  golimumab  (SIMPONI ARIA) 50 MG/4ML SOLN injection 2mg /kg    [provider]  ibuprofen (ADVIL) 200 MG tablet 1 tablet with food or milk as needed    [provider]  Ibuprofen-diphenhydrAMINE Cit (ADVIL PM PO) Take 2 tablets by mouth at bedtime as needed (for sleep).    [provider]  levothyroxine (SYNTHROID) 100 MCG tablet Take by mouth.    [provider]  levothyroxine (SYNTHROID, LEVOTHROID) 88 MCG tablet Take 88 mcg by mouth daily before breakfast.    [provider]  lisinopril-hydrochlorothiazide (PRINZIDE,ZESTORETIC) 10-12.5 MG per tablet Take 0.5 tablets by mouth 3 (three) times a week.     [provider]  methotrexate 2.5 MG tablet Take by mouth. 06/06/21   [provider]  methotrexate 2.5 MG tablet TAKE 7 TABLETS WEEKLY ON THE SAME DAY.    [provider]  raloxifene (EVISTA) 60 MG tablet Take 60 mg by mouth daily.    [provider]  vitamin B-12 (CYANOCOBALAMIN) 100 MCG tablet See admin instructions.    [provider]  zinc gluconate 50 MG tablet Take by mouth.    [provider]      Allergies    Patient has no known allergies.    Review of Systems   Review of Systems  All other systems reviewed and are negative.  Physical Exam Updated Vital Signs BP (!) 149/76 (BP Location:  Right Arm)   Pulse (!) 113   Temp 98.1 F (36.7 C) (Oral)   Resp 11   Ht 1.626 m (5\' 4" )   Wt 60.3 kg   SpO2 98%   BMI 22.83 kg/m  Physical Exam Vitals and nursing note reviewed.  Constitutional:      General: She is not in acute distress.    Appearance: She is well-developed.  HENT:     Head: Normocephalic.     Comments: Periorbital ecchymosis of the right eye, no other tenderness around the head    Mouth/Throat:     Pharynx: No oropharyngeal exudate.  Eyes:     General: No scleral icterus.       Right eye: No discharge.        Left eye: No discharge.     Conjunctiva/sclera: Conjunctivae  normal.     Pupils: Pupils are equal, round, and reactive to light.  Neck:     Thyroid: No thyromegaly.     Vascular: No JVD.  Cardiovascular:     Rate and Rhythm: Regular rhythm. Tachycardia present.     Heart sounds: Normal heart sounds. No murmur heard.   No friction rub. No gallop.  Pulmonary:     Effort: Pulmonary effort is normal. No respiratory distress.     Breath sounds: Normal breath sounds. No wheezing or rales.  Abdominal:     General: Bowel sounds are normal. There is no distension.     Palpations: Abdomen is soft. There is no mass.     Tenderness: There is no abdominal tenderness.  Musculoskeletal:        General: Tenderness present.     Cervical back: Normal range of motion and neck supple.     Comments: Bilateral lower extremities appear symmetrical without any leg length discrepancies.  There is pain with range of motion of the right hip, she is unable to straight leg raise secondary to pain in the right hip  Lymphadenopathy:     Cervical: No cervical adenopathy.  Skin:    General: Skin is warm and dry.     Findings: No erythema or rash.  Neurological:     Mental Status: She is alert.     Coordination: Coordination normal.     Comments: The patient has the ability to follow commands with exactly as except for raising the right leg secondary to pain, her level of alertness and mental status is normal.  Psychiatric:        Behavior: Behavior normal.    ED Results / Procedures / Treatments   Labs (all labs ordered are listed, but only abnormal results are displayed) Labs Reviewed - No data to display  EKG EKG Interpretation  Date/Time:  Thursday June 15 2021 14:26:47 EDT Ventricular Rate:  110 PR Interval:  169 QRS Duration: 80 QT Interval:  354 QTC Calculation: 479 R Axis:   13 Text Interpretation: Sinus tachycardia Consider left atrial enlargement Abnormal inferior Q waves Baseline wander in lead(s) V2 Confirmed by Eber Hong ([id number]) on 06/15/2021  3:29:56 PM  Radiology No results found.  Procedures .Critical Care Performed by: Eber Hong, MD Authorized by: Eber Hong, MD   Critical care provider statement:    Critical care time (minutes):  30   Critical care time was exclusive of:  Separately billable procedures and treating other patients and teaching time   Critical care was necessary to treat or prevent imminent or life-threatening deterioration of the following conditions:  Sepsis   Critical care  was time spent personally by me on the following activities:  Development of treatment plan with patient or surrogate, discussions with consultants, evaluation of patient's response to treatment, examination of patient, ordering and review of laboratory studies, ordering and review of radiographic studies, ordering and performing treatments and interventions, pulse oximetry, re-evaluation of patient's condition, review of old charts, blood draw for specimens and obtaining history from patient or surrogate   I assumed direction of critical care for this patient from another provider in my specialty: no     Care discussed with: admitting provider   Comments:          Medications Ordered in ED Medications - No data to display  ED Course/ Medical Decision Making/ A&P                           Medical Decision Making Amount and/or Complexity of Data Reviewed Labs: ordered. Radiology: ordered.  Risk Prescription drug management. Decision regarding hospitalization.   This patient presents to the ED for concern of fall, she has some underlying tachycardia which may be related to an underlying infection, this involves an extensive number of treatment options, and is a complaint that carries with it a high risk of complications and morbidity.  The differential diagnosis includes trauma including brain injury facial injury or spinal injury as she does have some tenderness over the lower cervical and upper thoracic spine.  Right  hip will need to be imaged and she will need a metabolic work-up secondary to the tachycardia given that she is immune suppressed.   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  The pertinent results include: Leukocytosis, renal function is preserved with a creatinine of 1.01, hemoglobin is 10.6, lactic acid is 1.3 and blood cultures are pending   Imaging Studies ordered:  I ordered imaging studies including x-rays of the pelvis the right hip and the maxillofacial bones as well as a CT scan of the brain and cervical spine I independently visualized and interpreted imaging which showed nondisplaced maxillofacial bones as well as a minimally displaced right pelvic fracture involving the acetabulum I agree with the radiologist interpretation   Cardiac Monitoring:  The patient was maintained on a cardiac monitor.  I personally viewed and interpreted the cardiac monitored which showed an underlying rhythm of: Sinus tachycardia   Medicines ordered and prescription drug management:  I ordered medication including IV fluids and pain medications for tachycardia, antibiotics will be added after cultures are obtained Reevaluation of the patient after these medicines showed that the patient improved I have reviewed the patients home medicines and have made adjustments as needed   Test Considered:  CT scan of the pelvis though at this time it does not seem like it is needed as this seems to be bony and orthopedic   Critical Interventions:  Consultation with orthopedics, consultation with ENT, consultation with hospitalist, pain  medication, admission to the hospital, antibiotics, cultures   Consultations Obtained:  I requested consultation with the ENT Dr. Jodean Lima, orthopedist Dr. Romeo Apple, and Dr. Randol Kern with the hospitalist,  and discussed lab and imaging findings as well as pertinent plan - they recommend: Physical  therapy, weightbearing as tolerated, outpatient follow-up for ENT fractures, no acute interventions are necessary   Problem List / ED Course:  The patient will be given antibiotics once the urinary test is obtained and the blood cultures were obtained, IV fluids are running, the patient is tachycardic and has a leukocytosis with there is a urine infection this would be consistent with sepsis.  I do not see any signs of endorgan dysfunction.  Orthopedic injuries were stable, consulted with orthopedics and ENT.  Will be admitted to the hospital            Final Clinical Impression(s) / ED Diagnoses Final diagnoses:  Closed displaced fracture of right acetabulum, unspecified portion of acetabulum, initial encounter [facility])  Closed fracture of orbital wall, initial encounter (HCC)  Acute cystitis without hematuria  Sepsis without acute organ dysfunction, due to unspecified organism [facility])     Eber Hong, MD 06/15/21 [id number]

## 2021-06-15 NOTE — ED Triage Notes (Signed)
Pt fell from porch and got up and fell again. Right eye bruised , right forearm skin tear, right hip towards knee pain.   ?

## 2021-06-16 ENCOUNTER — Observation Stay (HOSPITAL_COMMUNITY): Payer: Medicare Other

## 2021-06-16 DIAGNOSIS — M79604 Pain in right leg: Secondary | ICD-10-CM | POA: Diagnosis not present

## 2021-06-16 DIAGNOSIS — R262 Difficulty in walking, not elsewhere classified: Secondary | ICD-10-CM | POA: Diagnosis not present

## 2021-06-16 DIAGNOSIS — S32401A Unspecified fracture of right acetabulum, initial encounter for closed fracture: Secondary | ICD-10-CM | POA: Diagnosis present

## 2021-06-16 DIAGNOSIS — Z9012 Acquired absence of left breast and nipple: Secondary | ICD-10-CM | POA: Diagnosis not present

## 2021-06-16 DIAGNOSIS — S32501D Unspecified fracture of right pubis, subsequent encounter for fracture with routine healing: Secondary | ICD-10-CM | POA: Diagnosis not present

## 2021-06-16 DIAGNOSIS — S329XXA Fracture of unspecified parts of lumbosacral spine and pelvis, initial encounter for closed fracture: Secondary | ICD-10-CM | POA: Diagnosis present

## 2021-06-16 DIAGNOSIS — S32501A Unspecified fracture of right pubis, initial encounter for closed fracture: Secondary | ICD-10-CM | POA: Diagnosis not present

## 2021-06-16 DIAGNOSIS — Z9049 Acquired absence of other specified parts of digestive tract: Secondary | ICD-10-CM | POA: Diagnosis not present

## 2021-06-16 DIAGNOSIS — B9620 Unspecified Escherichia coli [E. coli] as the cause of diseases classified elsewhere: Secondary | ICD-10-CM | POA: Diagnosis present

## 2021-06-16 DIAGNOSIS — Z85038 Personal history of other malignant neoplasm of large intestine: Secondary | ICD-10-CM | POA: Diagnosis not present

## 2021-06-16 DIAGNOSIS — Z79631 Long term (current) use of antimetabolite agent: Secondary | ICD-10-CM | POA: Diagnosis not present

## 2021-06-16 DIAGNOSIS — M25551 Pain in right hip: Secondary | ICD-10-CM | POA: Diagnosis not present

## 2021-06-16 DIAGNOSIS — Z9181 History of falling: Secondary | ICD-10-CM | POA: Diagnosis not present

## 2021-06-16 DIAGNOSIS — S0511XA Contusion of eyeball and orbital tissues, right eye, initial encounter: Secondary | ICD-10-CM | POA: Diagnosis present

## 2021-06-16 DIAGNOSIS — W1789XA Other fall from one level to another, initial encounter: Secondary | ICD-10-CM | POA: Diagnosis present

## 2021-06-16 DIAGNOSIS — S32591A Other specified fracture of right pubis, initial encounter for closed fracture: Secondary | ICD-10-CM | POA: Diagnosis present

## 2021-06-16 DIAGNOSIS — S32511D Fracture of superior rim of right pubis, subsequent encounter for fracture with routine healing: Secondary | ICD-10-CM | POA: Diagnosis not present

## 2021-06-16 DIAGNOSIS — M069 Rheumatoid arthritis, unspecified: Secondary | ICD-10-CM | POA: Diagnosis present

## 2021-06-16 DIAGNOSIS — S0240CD Maxillary fracture, right side, subsequent encounter for fracture with routine healing: Secondary | ICD-10-CM | POA: Diagnosis not present

## 2021-06-16 DIAGNOSIS — S0240CA Maxillary fracture, right side, initial encounter for closed fracture: Secondary | ICD-10-CM | POA: Diagnosis present

## 2021-06-16 DIAGNOSIS — Z7401 Bed confinement status: Secondary | ICD-10-CM | POA: Diagnosis not present

## 2021-06-16 DIAGNOSIS — E559 Vitamin D deficiency, unspecified: Secondary | ICD-10-CM | POA: Diagnosis present

## 2021-06-16 DIAGNOSIS — Z853 Personal history of malignant neoplasm of breast: Secondary | ICD-10-CM | POA: Diagnosis not present

## 2021-06-16 DIAGNOSIS — E039 Hypothyroidism, unspecified: Secondary | ICD-10-CM | POA: Diagnosis present

## 2021-06-16 DIAGNOSIS — N3000 Acute cystitis without hematuria: Secondary | ICD-10-CM | POA: Diagnosis present

## 2021-06-16 DIAGNOSIS — S32401D Unspecified fracture of right acetabulum, subsequent encounter for fracture with routine healing: Secondary | ICD-10-CM | POA: Diagnosis not present

## 2021-06-16 DIAGNOSIS — Z66 Do not resuscitate: Secondary | ICD-10-CM | POA: Diagnosis present

## 2021-06-16 DIAGNOSIS — Z79890 Hormone replacement therapy (postmenopausal): Secondary | ICD-10-CM | POA: Diagnosis not present

## 2021-06-16 DIAGNOSIS — Z79899 Other long term (current) drug therapy: Secondary | ICD-10-CM | POA: Diagnosis not present

## 2021-06-16 DIAGNOSIS — I10 Essential (primary) hypertension: Secondary | ICD-10-CM | POA: Diagnosis present

## 2021-06-16 DIAGNOSIS — M25511 Pain in right shoulder: Secondary | ICD-10-CM | POA: Diagnosis not present

## 2021-06-16 DIAGNOSIS — R1312 Dysphagia, oropharyngeal phase: Secondary | ICD-10-CM | POA: Diagnosis not present

## 2021-06-16 DIAGNOSIS — R279 Unspecified lack of coordination: Secondary | ICD-10-CM | POA: Diagnosis not present

## 2021-06-16 DIAGNOSIS — Y92007 Garden or yard of unspecified non-institutional (private) residence as the place of occurrence of the external cause: Secondary | ICD-10-CM | POA: Diagnosis not present

## 2021-06-16 DIAGNOSIS — K5903 Drug induced constipation: Secondary | ICD-10-CM | POA: Diagnosis not present

## 2021-06-16 DIAGNOSIS — N390 Urinary tract infection, site not specified: Secondary | ICD-10-CM | POA: Diagnosis not present

## 2021-06-16 DIAGNOSIS — Z7982 Long term (current) use of aspirin: Secondary | ICD-10-CM | POA: Diagnosis not present

## 2021-06-16 LAB — CBC
HCT: 34.8 % — ABNORMAL LOW (ref 36.0–46.0)
Hemoglobin: 10.9 g/dL — ABNORMAL LOW (ref 12.0–15.0)
MCH: 30.5 pg (ref 26.0–34.0)
MCHC: 31.3 g/dL (ref 30.0–36.0)
MCV: 97.5 fL (ref 80.0–100.0)
Platelets: 184 10*3/uL (ref 150–400)
RBC: 3.57 MIL/uL — ABNORMAL LOW (ref 3.87–5.11)
RDW: 15.8 % — ABNORMAL HIGH (ref 11.5–15.5)
WBC: 12.6 10*3/uL — ABNORMAL HIGH (ref 4.0–10.5)
nRBC: 0 % (ref 0.0–0.2)

## 2021-06-16 LAB — BASIC METABOLIC PANEL
Anion gap: 8 (ref 5–15)
BUN: 13 mg/dL (ref 8–23)
CO2: 25 mmol/L (ref 22–32)
Calcium: 9.4 mg/dL (ref 8.9–10.3)
Chloride: 108 mmol/L (ref 98–111)
Creatinine, Ser: 0.92 mg/dL (ref 0.44–1.00)
GFR, Estimated: >60 mL/min (ref >60)
Glucose, Bld: 106 mg/dL — ABNORMAL HIGH (ref 70–99)
Potassium: 3.5 mmol/L (ref 3.5–5.1)
Sodium: 141 mmol/L (ref 135–145)

## 2021-06-16 LAB — VITAMIN D 25 HYDROXY (VIT D DEFICIENCY, FRACTURES): Vit D, 25-Hydroxy: 19.09 ng/mL — ABNORMAL LOW (ref 30–100)

## 2021-06-16 MED ORDER — ACETAMINOPHEN 325 MG PO TABS
650.0000 mg | ORAL_TABLET | Freq: Four times a day (QID) | ORAL | Status: DC
  Administered 2021-06-16 – 2021-06-19 (×11): 650 mg via ORAL
  Filled 2021-06-16 (×12): qty 2

## 2021-06-16 MED ORDER — OXYCODONE HCL 5 MG PO TABS
5.0000 mg | ORAL_TABLET | ORAL | Status: DC | PRN
  Administered 2021-06-16 – 2021-06-19 (×7): 5 mg via ORAL
  Filled 2021-06-16 (×8): qty 1

## 2021-06-16 NOTE — Progress Notes (Signed)
Patient ID: Wanda Cohen, female   DOB: 05/16/1934, 87 y.o.   MRN: [id number] ? ?CT scan reviewed  ? ?Ok to protected WB with walker for 6 weeks  ?

## 2021-06-16 NOTE — Hospital Course (Addendum)
87 y.o. female, with past medical history of rheumatoid arthritis, on methotrexate and other immunomodulators, and hypothyroidism no other significant history, she presents to ED secondary to fall, she lives by herself, she was walking in her back porch, which is approximately 4 stairs where she got to the top, felt a little bit dizzy and fell over the porch landing on her right side including right hip and forearm, and right side of the face which was bruised, she denies any loss of consciousness, focal deficits, patient is not on any anticoagulation, she does report dysuria and polyuria for last few days. ?-In ED her work-up significant for positive UA, and white blood cell count was elevated at 14.4 imaging significant for right maxillary sinus fracture, and hip x-ray significant for  acute mildly displaced fracture of the junction of the right acetabulum and superior pubic ramus, both ENT and orthopedic recommended NONoperative management, [facility] hospitalist consulted to admit. PT evaluated and now recommending SNF rehab placement.  Ortho recommending weightbearing as tolerated and follow up 6 weeks in office for xray.  ? ?06/17/2021: Pt remains in pain but agreeable to rehab. Pt now WB with walker for 6 weeks per Dr. Aline Brochure.  Working on rehab placement.   ? ?06/18/2021:  Pt unable to get transportation to her rehab facility today.  Likely can discharge on 3/20 when transportation has been arranged.   ? ?06/19/2021:  Pt reports ambulating with assistance and walker.  Agreeable to SNF rehab.  Transportation arranged.  DC today.   ?

## 2021-06-16 NOTE — Assessment & Plan Note (Signed)
Resumed home levothyroxine.  ?

## 2021-06-16 NOTE — Plan of Care (Signed)
?  Problem: Acute Rehab PT Goals(only PT should resolve) ?Goal: Pt Will Go Supine/Side To Sit ?Outcome: Progressing ?Flowsheets (Taken 06/16/2021 1102) ?Pt will go Supine/Side to Sit: with minimal assist ?Goal: Patient Will Transfer Sit To/From Stand ?Outcome: Progressing ?Flowsheets (Taken 06/16/2021 1102) ?Patient will transfer sit to/from stand: with minimal assist ?Goal: Pt Will Transfer Bed To Chair/Chair To Bed ?Outcome: Progressing ?Flowsheets (Taken 06/16/2021 1102) ?Pt will Transfer Bed to Chair/Chair to Bed: with min assist ?Goal: Pt Will Ambulate ?Outcome: Progressing ?Flowsheets (Taken 06/16/2021 1102) ?Pt will Ambulate: ? 50 feet ? with minimal assist ? with moderate assist ? with rolling walker ?  ?11:03 AM, 06/16/21 ?Lonell Grandchild, MPT ?Physical Therapist with [place] ?[facility] ?[phone] office ?[phone] mobile phone ? ?

## 2021-06-16 NOTE — Evaluation (Signed)
Occupational Therapy Evaluation ?Patient Details ?Name: Wanda Cohen ?MRN: [id number] ?DOB: May 20, 1934 ?Today's Date: 06/16/2021 ? ? ?History of Present Illness Wanda Cohen  is a 87 y.o. female, with past medical history of rheumatoid arthritis, on methotrexate and other immunomodulators, and hypothyroidism no other significant history, she presents to ED secondary to fall, she lives by herself, she was walking in her back porch, which is approximately 4 stairs where she got to the top, felt a little bit dizzy and fell over the porch landing on her right side including right hip and forearm, and right side of the face which was bruised, she denies any loss of consciousness, focal deficits, patient is not on any anticoagulation, she does report dysuria and polyuria for last few days. (Per MD note)  ? ?Clinical Impression ?  ?Pt agreeable to OT and PT co-evaluation. Pt independent at baseline but limited today with need for moderate assist to bed mobility and transfers. Pt reports increase in pain with mobility of R LE. Poor standing balance with difficulty bearing weight through R LE. Pt also demonstrates limited A/ROM of R UE at the shoulder with reports of falling on that shoulder. Pt was left in chair with call bell within reach. Pt will benefit from continued OT in the hospital and recommended venue below to increase strength, balance, and endurance for safe ADL's.  ? ? ?   ? ?Recommendations for follow up therapy are one component of a multi-disciplinary discharge planning process, led by the attending physician.  Recommendations may be updated based on patient status, additional functional criteria and insurance authorization.  ? ?Follow Up Recommendations ? Skilled nursing-short term rehab (<3 hours/day)  ?  ?Assistance Recommended at Discharge Intermittent Supervision/Assistance  ?Patient can return home with the following A lot of help with walking and/or transfers;A lot of help with  bathing/dressing/bathroom;Help with stairs or ramp for entrance;Assist for transportation;Assistance with cooking/housework ? ?  ?Functional Status Assessment ? Patient has had a recent decline in their functional status and demonstrates the ability to make significant improvements in function in a reasonable and predictable amount of time.  ?Equipment Recommendations ? None recommended by OT  ?  ?Recommendations for Other Services   ? ? ?  ?Precautions / Restrictions Precautions ?Precautions: Fall ?Restrictions ?Weight Bearing Restrictions: No  ? ?  ? ?Mobility Bed Mobility ?Overal bed mobility: Needs Assistance ?Bed Mobility: Supine to Sit ?  ?  ?Supine to sit: Mod assist ?  ?  ?General bed mobility comments: Slow labored movement with increase in pain with mobility. ?  ? ?Transfers ?Overall transfer level: Needs assistance ?Equipment used: Rolling walker (2 wheels) ?Transfers: Sit to/from Stand, Bed to chair/wheelchair/BSC ?Sit to Stand: Mod assist ?  ?  ?Step pivot transfers: Mod assist ?  ?  ?General transfer comment: Pt presetns with difficulty bearing weight through R LE needing assist and cuing for proper use of RW with little carry over. ?  ? ?  ?Balance Overall balance assessment: Needs assistance ?Sitting-balance support: No upper extremity supported, Feet supported ?Sitting balance-Leahy Scale: Good ?Sitting balance - Comments: seated at EOB ?  ?Standing balance support: Bilateral upper extremity supported, During functional activity, Reliant on assistive device for balance ?Standing balance-Leahy Scale: Poor ?Standing balance comment: using RW ?  ?  ?  ?  ?  ?  ?  ?  ?  ?  ?  ?   ? ?ADL either performed or assessed with clinical judgement  ? ?ADL Overall ADL's : Needs  assistance/impaired ?Eating/Feeding: Independent;Sitting ?  ?Grooming: Moderate assistance;Maximal assistance;Standing ?  ?Upper Body Bathing: Set up;Sitting ?  ?  ?  ?  ?  ?Lower Body Dressing: Maximal assistance;Bed level ?Lower Body  Dressing Details (indicate cue type and reason): Assited to don socks in bed. ?Toilet Transfer: Moderate assistance;Rolling walker (2 wheels);Stand-pivot ?Toilet Transfer Details (indicate cue type and reason): simulated via EOB to chair with RW ?Toileting- Clothing Manipulation and Hygiene: Moderate assistance;Maximal assistance;Sit to/from stand ?  ?  ?  ?Functional mobility during ADLs: Moderate assistance;Rolling walker (2 wheels) ?General ADL Comments: Pt demosntrates slow labored movement with primary lmitation being pain with mobilty adn weight bearing. Decreased LE function.  ? ? ? ?Vision Baseline Vision/History: 1 Wears glasses ?Ability to See in Adequate Light: 1 Impaired ?Patient Visual Report: No change from baseline ?Vision Assessment?: No apparent visual deficits  ?   ?Perception   ?  ?Praxis   ?  ? ?Pertinent Vitals/Pain Pain Assessment ?Pain Assessment: 0-10 ?Pain Score: 8  ?Pain Location: chest down side to R hip ?Pain Descriptors / Indicators: Sharp ?Pain Intervention(s): Limited activity within patient's tolerance, Monitored during session, Repositioned, Premedicated before session  ? ? ? ?Hand Dominance Left ?  ?Extremity/Trunk Assessment Upper Extremity Assessment ?Upper Extremity Assessment: RUE deficits/detail;Generalized weakness ?RUE Deficits / Details: 3-/5 MMT shoulder flexion; 3/5 shoulder abduction. Gnerally weak elbow wrist and grip. RA at baseline. ?RUE Sensation: WNL ?RUE Coordination: WNL ?  ?Lower Extremity Assessment ?Lower Extremity Assessment: Defer to PT evaluation ?  ?Cervical / Trunk Assessment ?Cervical / Trunk Assessment: Normal ?  ?Communication Communication ?Communication: No difficulties ?  ?Cognition Arousal/Alertness: Awake/alert ?Behavior During Therapy: [facility] for tasks assessed/performed ?Overall Cognitive Status: Within Functional Limits for tasks assessed ?  ?  ?  ?  ?  ?  ?  ?  ?  ?  ?  ?  ?  ?  ?  ?  ?  ?  ?  ?   ? ?  ?   ?  ?    ? ? ?Home Living Family/patient  expects to be discharged to:: Private residence ?Living Arrangements: Alone ?Available Help at Discharge:  (none reported) ?Type of Home: House ?Home Access: Stairs to enter ?Entrance Stairs-Number of Steps: 1 ?Entrance Stairs-Rails: None ?Home Layout: Laundry or work area in basement;Two level ?Alternate Level Stairs-Number of Steps: 12 ?Alternate Level Stairs-Rails: Right (going down) ?Bathroom Shower/Tub: Tub/shower unit ?  ?Bathroom Toilet: Handicapped height ?Bathroom Accessibility: Yes ?How Accessible: Accessible via walker ?Home Equipment: Standard Walker;Cane - single point ?  ?  ?  ? ?  ?Prior Functioning/Environment Prior Level of Function : Independent/Modified Independent ?  ?  ?  ?  ?  ?  ?Mobility Comments: single point cane used for household and community ambulation. ?ADLs Comments: Independent ?  ? ?  ?  ?OT Problem List: Decreased strength;Decreased range of motion;Decreased activity tolerance;Impaired balance (sitting and/or standing);Pain ?  ?   ?OT Treatment/Interventions: Self-care/ADL training;Therapeutic exercise;Therapeutic activities;Patient/family education;Balance training;DME and/or AE instruction;Energy conservation  ?  ?OT Goals(Current goals can be found in the care plan section) Acute Rehab OT Goals ?Patient Stated Goal: return home ?OT Goal Formulation: With patient ?Time For Goal Achievement: 06/30/21 ?Potential to Achieve Goals: Fair  ?OT Frequency: Min 2X/week ?  ? ?Co-evaluation PT/OT/SLP Co-Evaluation/Treatment: Yes ?Reason for Co-Treatment: To address functional/ADL transfers ?  ?OT goals addressed during session: ADL's and self-care ?  ? ?  ?AM-PAC OT "6 Clicks" Daily Activity     ?  Outcome Measure Help from another person eating meals?: None ?Help from another person taking care of personal grooming?: A Lot ?Help from another person toileting, which includes using toliet, bedpan, or urinal?: A Lot ?Help from another person bathing (including washing, rinsing, drying)?: A  Lot ?Help from another person to put on and taking off regular upper body clothing?: A Little ?Help from another person to put on and taking off regular lower body clothing?: A Lot ?6 Click Score: 15 ?  ?End of Session Equipmen

## 2021-06-16 NOTE — Assessment & Plan Note (Addendum)
ENT Dr. Benjamine Mola consulted and recommended nonoperative management and follow up in office for recheck in 2-3 weeks.  ?

## 2021-06-16 NOTE — Assessment & Plan Note (Addendum)
Symptoms exacerbated by fall.  Schedule acetaminophen every 6 hours.   Resume home treatments.  ?

## 2021-06-16 NOTE — Plan of Care (Signed)
?  Problem: Acute Rehab OT Goals (only OT should resolve) ?Goal: Pt. Will Perform Grooming ?Flowsheets (Taken 06/16/2021 0947) ?Pt Will Perform Grooming: ? with min assist ? standing ?Goal: Pt. Will Perform Upper Body Dressing ?Flowsheets (Taken 06/16/2021 0947) ?Pt Will Perform Upper Body Dressing: ? sitting ? with modified independence ?Goal: Pt. Will Perform Lower Body Dressing ?Flowsheets (Taken 06/16/2021 0947) ?Pt Will Perform Lower Body Dressing: ? sitting/lateral leans ? with adaptive equipment ? with set-up ?Goal: Pt. Will Transfer To Toilet ?Flowsheets (Taken 06/16/2021 0947) ?Pt Will Transfer to Toilet: ? with supervision ? stand pivot transfer ?Goal: Pt. Will Perform Toileting-Clothing Manipulation ?Flowsheets (Taken 06/16/2021 0947) ?Pt Will Perform Toileting - Clothing Manipulation and hygiene: ? with min assist ? sit to/from stand ? sitting/lateral leans ? with adaptive equipment ?Goal: Pt/Caregiver Will Perform Home Exercise Program ?Flowsheets (Taken 06/16/2021 [phone]) ?Pt/caregiver will Perform Home Exercise Program: ? Increased ROM ? Increased strength ? Right Upper extremity ? Left upper extremity ? Independently ? Izora Benn OT, MOT ? ?

## 2021-06-16 NOTE — TOC Initial Note (Signed)
Transition of Care (TOC) - Initial/Assessment Note  ? ? ?Patient Details  ?Name: Wanda Cohen ?MRN: [id number] ?Date of Birth: 1935-03-26 ? ?Transition of Care (TOC) CM/SW Contact:    ?Boneta Lucks, RN ?Phone Number: ?06/16/2021, 2:20 PM ? ?Clinical Narrative:     Patient admitted with Pelvic fracture. Mark,patient son contacted [facility] wanting referral sent to [facility] in [place]. TOC contacted Teryl Lucy in admissions and faxed FL2, H&P, Face sheet, PT/OT and progress notes. TOC waiting for a bed offer.            ? ? ?Expected Discharge Plan: [place] ?Barriers to Discharge: SNF Pending bed offer, Continued Medical Work up ? ?Patient Goals and CMS Choice ?Patient states their goals for this hospitalization and ongoing recovery are:: to go to SNF ?CMS Medicare.gov Compare Post Acute Care list provided to:: Patient Represenative (must comment) ?Choice offered to / list presented to : Adult Children ? ?Expected Discharge Plan and Services ?Expected Discharge Plan: [place] ?   ? ?Prior Living Arrangements/Services ?  ?Lives with:: Self ?Patient language and need for interpreter reviewed:: Yes ?       ?Need for Family Participation in Patient Care: Yes (Comment) ?Care giver support system in place?: Yes (comment) ?  ?Criminal Activity/Legal Involvement Pertinent to Current Situation/Hospitalization: No - Comment as needed ? ?Activities of Daily Living ?Home Assistive Devices/Equipment: Kasandra Knudsen (specify quad or straight), Grab bars around toilet, Grab bars in shower ?ADL Screening (condition at time of admission) ?Patient's cognitive ability adequate to safely complete daily activities?: Yes ?Is the patient deaf or have difficulty hearing?: No ?Does the patient have difficulty seeing, even when wearing glasses/contacts?: No ?Does the patient have difficulty concentrating, remembering, or making decisions?: No ?Patient able to express need for assistance with ADLs?: Yes ?Does  the patient have difficulty dressing or bathing?: Yes ?Independently performs ADLs?: No ?Communication: Independent ?Dressing (OT): Needs assistance ?Is this a change from baseline?: Change from baseline, expected to last <3days ?Feeding: Independent ?Bathing: Needs assistance ?Is this a change from baseline?: Change from baseline, expected to last <3 days ?Toileting: Needs assistance ?Is this a change from baseline?: Change from baseline, expected to last <3 days ?In/Out Bed: Needs assistance ?Is this a change from baseline?: Change from baseline, expected to last <3 days ?Walks in Home: Needs assistance ?Is this a change from baseline?: Change from baseline, expected to last <3 days ?Does the patient have difficulty walking or climbing stairs?: Yes ?Weakness of Legs: Both ?Weakness of Arms/Hands: Both ? ?Permission Sought/Granted  - son Elta [place] ? Emotional Assessment ?  ?  ?Affect (typically observed): Accepting ?Orientation: : Oriented to Self, Oriented to Place, Oriented to  Time, Oriented to Situation ?Alcohol / Substance Use: Not Applicable ?Psych Involvement: No (comment) ? ?Admission diagnosis:  Pelvis fracture ([place]) [S32.9XXA] ?Acute cystitis without hematuria [N30.00] ?Closed fracture of orbital wall, initial encounter ([place]) [S02.85XA] ?Closed displaced fracture of right acetabulum, unspecified portion of acetabulum, initial encounter ([place]) [S32.401A] ?Sepsis without acute organ dysfunction, due to unspecified organism [facility]) [A41.9] ?Pelvic fracture ([facility]) [S32.9XXA] ?Patient Active Problem List  ? Diagnosis Date Noted  ? Pelvic fracture ([place]) 06/16/2021  ? Hypothyroidism   ? Pelvis fracture ([place]) 06/15/2021  ? Right maxillary fracture ([place]) 06/15/2021  ? UTI (urinary tract infection) 06/15/2021  ? Rheumatoid arthritis ([place]) 06/15/2021  ? Personal history of colon cancer   ? Diverticulosis of large intestine without diverticulitis   ? ?PCP:  Manon Hilding, MD ?Pharmacy:   ?  [facility] ?[place] [address] ?Phone: [phone] Fax: [phone] ? ? ?

## 2021-06-16 NOTE — Evaluation (Signed)
Physical Therapy Evaluation ?Patient Details ?Name: Wanda Cohen ?MRN: [id number] ?DOB: 1935/01/18 ?Today's Date: 06/16/2021 ? ?History of Present Illness ? Wanda Cohen  is a 87 y.o. female, with past medical history of rheumatoid arthritis, on methotrexate and other immunomodulators, and hypothyroidism no other significant history, she presents to ED secondary to fall, she lives by herself, she was walking in her back porch, which is approximately 4 stairs where she got to the top, felt a little bit dizzy and fell over the porch landing on her right side including right hip and forearm, and right side of the face which was bruised, she denies any loss of consciousness, focal deficits, patient is not on any anticoagulation, she does report dysuria and polyuria for last few days. ?  ?Clinical Impression ? Patient demonstrates slow labored movement for sitting up at bedside with c/o increased pain right hip/groin area with  movement, very unsteady on feet, at high risk for falls when taking steps at bedside and limited to a few side steps due to buckling of knees with poor tolerance for weightbearing on RLE.  Patient tolerated sitting up in chair after therapy - nurse notified.  Patient will benefit from continued skilled physical therapy in hospital and recommended venue below to increase strength, balance, endurance for safe ADLs and gait.  ? ?   ?   ? ?Recommendations for follow up therapy are one component of a multi-disciplinary discharge planning process, led by the attending physician.  Recommendations may be updated based on patient status, additional functional criteria and insurance authorization. ? ?Follow Up Recommendations Skilled nursing-short term rehab (<3 hours/day) ? ?  ?Assistance Recommended at Discharge Intermittent Supervision/Assistance  ?Patient can return home with the following ? A lot of help with walking and/or transfers;A lot of help with bathing/dressing/bathroom;Help with stairs or  ramp for entrance;Assistance with cooking/housework ? ?  ?Equipment Recommendations Rolling walker (2 wheels)  ?Recommendations for Other Services ?    ?  ?Functional Status Assessment Patient has had a recent decline in their functional status and demonstrates the ability to make significant improvements in function in a reasonable and predictable amount of time.  ? ?  ?Precautions / Restrictions Precautions ?Precautions: Fall ?Restrictions ?Weight Bearing Restrictions: Yes ?RLE Weight Bearing: Weight bearing as tolerated  ? ?  ? ?Mobility ? Bed Mobility ?Overal bed mobility: Needs Assistance ?Bed Mobility: Supine to Sit ?  ?  ?Supine to sit: Mod assist ?  ?  ?General bed mobility comments: as per OT notes ?  ? ?Transfers ?Overall transfer level: Needs assistance ?Equipment used: Rolling walker (2 wheels) ?Transfers: Sit to/from Stand, Bed to chair/wheelchair/BSC ?Sit to Stand: Mod assist ?  ?Step pivot transfers: Mod assist ?  ?  ?  ?General transfer comment: as per OT notes ?  ? ?Ambulation/Gait ?Ambulation/Gait assistance: Mod assist, Max assist ?Gait Distance (Feet): 6 Feet ?Assistive device: Rolling walker (2 wheels) ?Gait Pattern/deviations: Decreased step length - right, Decreased step length - left, Decreased stance time - right, Decreased stride length, Antalgic ?Gait velocity: decreased ?  ?  ?General Gait Details: limited to a few side steps with frequent buckling of RLE due to increased hip/groin pain and poor standing balance ? ?Stairs ?  ?  ?  ?  ?  ? ?Wheelchair Mobility ?  ? ?Modified Rankin (Stroke Patients Only) ?  ? ?  ? ?Balance Overall balance assessment: Needs assistance ?Sitting-balance support: Feet supported, No upper extremity supported ?Sitting balance-Leahy Scale: Good ?Sitting balance - Comments:  seated at EOB ?  ?Standing balance support: Bilateral upper extremity supported, During functional activity, Reliant on assistive device for balance ?Standing balance-Leahy Scale:  Poor ?Standing balance comment: using RW ?  ?  ?  ?  ?  ?  ?  ?  ?  ?  ?  ?   ? ? ? ?Pertinent Vitals/Pain Pain Assessment ?Pain Assessment: 0-10 ?Pain Score: 8  ?Pain Location: chest down side to R hip ?Pain Descriptors / Indicators: Sharp ?Pain Intervention(s): Limited activity within patient's tolerance, Monitored during session, Premedicated before session, Repositioned  ? ? ?Home Living Family/patient expects to be discharged to:: Private residence ?Living Arrangements: Alone ?Available Help at Discharge: Family;Available PRN/intermittently ?Type of Home: House ?Home Access: Stairs to enter ?Entrance Stairs-Rails: None ?Entrance Stairs-Number of Steps: 1 ?Alternate Level Stairs-Number of Steps: 12 ?Home Layout: Laundry or work area in basement;Two level ?Home Equipment: Standard Walker;Cane - single point ?   ?  ?Prior Function Prior Level of Function : Independent/Modified Independent ?  ?  ?  ?  ?  ?  ?Mobility Comments: single point cane used for household and community ambulation. ?ADLs Comments: Independent ?  ? ? ?Hand Dominance  ? Dominant Hand: Left ? ?  ?Extremity/Trunk Assessment  ? Upper Extremity Assessment ?Upper Extremity Assessment: Defer to OT evaluation ?RUE Deficits / Details: 3-/5 MMT shoulder flexion; 3/5 shoulder abduction. Gnerally weak elbow wrist and grip. RA at baseline. ?RUE Sensation: WNL ?RUE Coordination: WNL ?  ? ?Lower Extremity Assessment ?Lower Extremity Assessment: Generalized weakness;RLE deficits/detail ?RLE Deficits / Details: grossly -3/5 ?RLE: Unable to fully assess due to pain ?RLE Sensation: WNL ?RLE Coordination: WNL ?  ? ?Cervical / Trunk Assessment ?Cervical / Trunk Assessment: Normal  ?Communication  ? Communication: No difficulties  ?Cognition Arousal/Alertness: Awake/alert ?Behavior During Therapy: [facility] for tasks assessed/performed ?Overall Cognitive Status: Within Functional Limits for tasks assessed ?  ?  ?  ?  ?  ?  ?  ?  ?  ?  ?  ?  ?  ?  ?  ?  ?  ?  ?  ? ?   ?General Comments   ? ?  ?Exercises    ? ?Assessment/Plan  ?  ?PT Assessment Patient needs continued PT services  ?PT Problem List Decreased strength;Decreased activity tolerance;Decreased balance;Decreased mobility ? ?   ?  ?PT Treatment Interventions DME instruction;Gait training;Stair training;Functional mobility training;Therapeutic activities;Therapeutic exercise;Balance training;Patient/family education   ? ?PT Goals (Current goals can be found in the Care Plan section)  ?Acute Rehab PT Goals ?Patient Stated Goal: return home after rehab ?PT Goal Formulation: With patient ?Time For Goal Achievement: 06/30/21 ?Potential to Achieve Goals: Good ? ?  ?Frequency Min 3X/week ?  ? ? ?Co-evaluation PT/OT/SLP Co-Evaluation/Treatment: Yes ?Reason for Co-Treatment: To address functional/ADL transfers ?PT goals addressed during session: Mobility/safety with mobility;Balance;Proper use of DME ?OT goals addressed during session: ADL's and self-care ?  ? ? ?  ?AM-PAC PT "6 Clicks" Mobility  ?Outcome Measure Help needed turning from your back to your side while in a flat bed without using bedrails?: A Lot ?Help needed moving from lying on your back to sitting on the side of a flat bed without using bedrails?: A Lot ?Help needed moving to and from a bed to a chair (including a wheelchair)?: A Lot ?Help needed standing up from a chair using your arms (e.g., wheelchair or bedside chair)?: A Lot ?Help needed to walk in hospital room?: A Lot ?Help needed climbing 3-5  steps with a railing? : Total ?6 Click Score: 11 ? ?  ?End of Session   ?Activity Tolerance: Patient tolerated treatment well;Patient limited by fatigue ?Patient left: in chair;with call bell/phone within reach ?Nurse Communication: Mobility status ?PT Visit Diagnosis: Unsteadiness on feet (R26.81);Other abnormalities of gait and mobility (R26.89);Muscle weakness (generalized) (M62.81) ?  ? ?Time: 5732-2025 ?PT Time Calculation (min) (ACUTE ONLY): 23  min ? ? ?Charges:   PT Evaluation ?$PT Eval Moderate Complexity: 1 Mod ?PT Treatments ?$Therapeutic Activity: 23-37 mins ?  ?   ? ? ?11:00 AM, 06/16/21 ?Lonell Grandchild, MPT ?Physical Therapist with [place] ?[facility] ?35

## 2021-06-16 NOTE — Assessment & Plan Note (Addendum)
FULLY TREATED with IV ceftriaxone ?

## 2021-06-16 NOTE — NC FL2 (Signed)
?[place] MEDICA[place] FL2 LEVEL OF CARE SCREENING TOOL  ?  ? ?IDENTIFICATION  ?Patient Name: ?Wanda Cohen Birthdate: Jan 21, 1935 Sex: female Admission Date (Current Location): ?06/15/2021  ?[place] and [place] Number: ? [place] and Address:  ?[facility] [address], [place] ?     Provider Number: ?[id number]  ?Attending Physician Name and Address:  ?Murlean Iba, MD ? Relative Name and Phone Number:  ?Emelyn Roen - Son [phone] ?   ?Current Level of Care: ?Hospital Recommended Level of Care: ?[place] Prior Approval Number: ?  ? ?Date Approved/Denied: ?  PASRR Number: ?[id number] A ? ?Discharge Plan: ?SNF ?  ? ?Current Diagnoses: ?Patient Active Problem List  ? Diagnosis Date Noted  ? Hypothyroidism   ? Pelvis fracture ([place]) 06/15/2021  ? Right maxillary fracture ([place]) 06/15/2021  ? UTI (urinary tract infection) 06/15/2021  ? Rheumatoid arthritis ([place]) 06/15/2021  ? Personal history of colon cancer   ? Diverticulosis of large intestine without diverticulitis   ? ? ?Orientation RESPIRATION BLADDER Height & Weight   ?  ?Self, Time, Situation, Place ? Normal External catheter Weight: 60.3 kg ?Height:  '5\' 4"'$  (162.6 cm)  ?BEHAVIORAL SYMPTOMS/MOOD NEUROLOGICAL BOWEL NUTRITION STATUS  ?    Continent Diet (See DC summary)  ?AMBULATORY STATUS COMMUNICATION OF NEEDS Skin   ?Extensive Assist Verbally Skin abrasions ?  ?  ?  ?    ?     ?     ? ? ?Personal Care Assistance Level of Assistance  ?Bathing, Feeding, Dressing Bathing Assistance: Maximum assistance ?Feeding assistance: Independent ?Dressing Assistance: Limited assistance ?   ? ?Functional Limitations Info  ?Sight, Hearing, Speech Sight Info: Adequate ?Hearing Info: Adequate ?Speech Info: Adequate  ? ? ?SPECIAL CARE FACTORS FREQUENCY  ?PT (By licensed PT)   ?  ?PT Frequency: 5 times a week ?  ?  ?  ?  ?   ? ? ?Contractures Contractures Info: Not present  ? ? ?Additional Factors Info  ?Code Status,  Allergies Code Status Info: DNR ?Allergies Info: NKDA ?  ?  ?  ?   ? ?Current Medications (06/16/2021):  This is the current hospital active medication list ?Current Facility-Administered Medications  ?Medication Dose Route Frequency Provider Last Rate Last Admin  ? acetaminophen (TYLENOL) tablet 650 mg  650 mg Oral Q6H Johnson, Clanford L, MD      ? calcium-vitamin D (OSCAL WITH D) 500-5 MG-MCG per tablet 2 tablet  2 tablet Oral BID Elgergawy, Silver Huguenin, MD   2 tablet at 06/16/21 0815  ? cefTRIAXone (ROCEPHIN) 1 g in sodium chloride 0.9 % 100 mL IVPB  1 g Intravenous Q24H Elgergawy, Silver Huguenin, MD   Stopped at 06/15/21 2204  ? enoxaparin (LOVENOX) injection 40 mg  40 mg Subcutaneous Q24H Elgergawy, Silver Huguenin, MD      ? folic acid (FOLVITE) tablet 1 mg  1 mg Oral Daily Elgergawy, Silver Huguenin, MD   1 mg at 06/16/21 0815  ? levothyroxine (SYNTHROID) tablet 88 mcg  88 mcg Oral QAC breakfast Elgergawy, Silver Huguenin, MD   88 mcg at 06/16/21 2119  ? methocarbamol (ROBAXIN) 500 mg in dextrose 5 % 50 mL IVPB  500 mg Intravenous Q6H PRN Elgergawy, Silver Huguenin, MD      ? morphine (PF) 2 MG/ML injection 1 mg  1 mg Intravenous Q4H PRN Elgergawy, Silver Huguenin, MD   1 mg at 06/16/21 4174  ? ondansetron (ZOFRAN) tablet 4 mg  4  mg Oral Q6H PRN Elgergawy, Silver Huguenin, MD      ? Or  ? ondansetron (ZOFRAN) injection 4 mg  4 mg Intravenous Q6H PRN Elgergawy, Silver Huguenin, MD      ? oxyCODONE (Oxy IR/ROXICODONE) immediate release tablet 5 mg  5 mg Oral Q4H PRN Johnson, Clanford L, MD      ? raloxifene (EVISTA) tablet 60 mg  60 mg Oral Daily Elgergawy, Silver Huguenin, MD   60 mg at 06/16/21 0815  ? senna (SENOKOT) tablet 8.6 mg  1 tablet Oral BID Elgergawy, Silver Huguenin, MD   8.6 mg at 06/16/21 0815  ? vitamin B-12 (CYANOCOBALAMIN) tablet 250 mcg  250 mcg Oral Daily Elgergawy, Silver Huguenin, MD   250 mcg at 06/16/21 8110  ? ? ? ?Discharge Medications: ?Please see discharge summary for a list of discharge medications. ? ?Relevant Imaging Results: ? ?Relevant Lab  Results: ? ? ?Additional Information ?SS# [id number] ? ?Boneta Lucks, RN ? ? ? ? ?

## 2021-06-16 NOTE — Assessment & Plan Note (Addendum)
CT confirms stable pelvic fracture and orthopedics Dr. Aline Brochure evaluated and recommending weightbearing as tolerated and follow up in office in 6 weeks for xray with Dr. Aline Brochure.    ?Pain management as ordered, adjust regimen as needed.  ?PT recommending SNF.   ?TOC consulted for SNF placement.   Arrangements made.   ?

## 2021-06-16 NOTE — Progress Notes (Signed)
?PROGRESS NOTE ? ? ?Wanda Cohen  AOZ:[id number] DOB: 03-Dec-1934 DOA: 06/15/2021 ?PCP: Manon Hilding, MD  ? ?Chief Complaint  ?Patient presents with  ? Fall  ? ?Level of care: Med-Surg ? ?Brief Admission History:  ?87 y.o. female, with past medical history of rheumatoid arthritis, on methotrexate and other immunomodulators, and hypothyroidism no other significant history, she presents to ED secondary to fall, she lives by herself, she was walking in her back porch, which is approximately 4 stairs where she got to the top, felt a little bit dizzy and fell over the porch landing on her right side including right hip and forearm, and right side of the face which was bruised, she denies any loss of consciousness, focal deficits, patient is not on any anticoagulation, she does report dysuria and polyuria for last few days. ?-In ED her work-up significant for positive UA, and white blood cell count was elevated at 14.4 imaging significant for right maxillary sinus fracture, and hip x-ray significant for  acute mildly displaced fracture of the junction of the right acetabulum and superior pubic ramus, both ENT and orthopedic recommended NONoperative management, [facility] hospitalist consulted to admit. PT evaluated and now recommending SNF rehab placement.  Ortho recommending weightbearing as tolerated and follow up 6 weeks in office for xray.  ?  ?Assessment and Plan: ?* Pelvis fracture ([place]) ?CT confirms stable pelvic fracture and orthopedics Dr. Aline Brochure evaluated and recommending weightbearing as tolerated and follow up in office in 6 weeks for xray.   ?Pain management as ordered, adjust regimen as needed.  ?PT recommending SNF.   ?TOC consulted for SNF placement.    ? ?UTI (urinary tract infection) ?Continue IV ceftriaxone ?Follow culture  ? ?Right maxillary fracture ([place]) ?ENT Dr. Benjamine Mola consulted and recommended nonoperative management and follow up in office for recheck.  ? ?Hypothyroidism ?Resumed home levothyroxine.   ? ?Rheumatoid arthritis ([place]) ?Symptoms exacerbated by fall.  Schedule acetaminophen every 6 hours.   ? ? ?DVT prophylaxis: enoxaparin ?Code Status: DNR  ?Family Communication:  ?Disposition: Status is: Observation ?The patient remains OBS appropriate and will d/c before 2 midnights. ?  ?Consultants:  ?Ortho ?ENT ?Procedures:  ? ?Antimicrobials:  ?Ceftriaxone 3/16>>  ?Subjective: ?Pt reports that she is in severe pain, barely able to move due to pain.   ?Objective: ?Vitals:  ? 06/15/21 1930 06/15/21 2033 06/16/21 0047 06/16/21 0435  ?BP: (!) 162/74 (!) 144/83 (!) 172/84 (!) 152/53  ?Pulse: (!) 110 (!) 107 (!) 107 92  ?Resp: '20 17 20 17  '$ ?Temp: 98 ?F (36.7 ?C) 98.8 ?F (37.1 ?C) 98.3 ?F (36.8 ?C) 98.2 ?F (36.8 ?C)  ?TempSrc: Oral Oral Oral Oral  ?SpO2: 98% 98% 100% 95%  ?Weight:      ?Height:      ? ? ?Intake/Output Summary (Last 24 hours) at 06/16/2021 1138 ?Last data filed at 06/16/2021 0900 ?Gross per 24 hour  ?Intake 969.92 ml  ?Output 13 ml  ?Net 956.92 ml  ? ?Filed Weights  ? 06/15/21 1427  ?Weight: 60.3 kg  ? ?Examination: ? ?General exam: Appears calm and uncomfortable, sitting up in chair.  ?Respiratory system: Clear to auscultation. Respiratory effort normal. ?Cardiovascular system: normal S1 & S2 heard. No JVD, murmurs, rubs, gallops or clicks. No pedal edema. ?Gastrointestinal system: Abdomen is nondistended, soft and nontender. No organomegaly or masses felt. Normal bowel sounds heard. ?Central nervous system: Alert and oriented. No focal neurological deficits. ?Extremities: diffuse deformities of RA in multiple joints. ?Skin: No rashes, lesions or ulcers. ?  Psychiatry: Judgement and insight appear normal. Mood & affect appropriate.  ? ?Data Reviewed: I have personally reviewed following labs and imaging studies ? ?CBC: ?Recent Labs  ?Lab 06/15/21 ?1659 06/16/21 ?0629  ?WBC 14.4* 12.6*  ?NEUTROABS 12.2*  --   ?HGB 10.6* 10.9*  ?HCT 34.7* 34.8*  ?MCV 98.0 97.5  ?PLT 203 184  ? ? ?Basic Metabolic  Panel: ?Recent Labs  ?Lab 06/15/21 ?1659 06/16/21 ?0629  ?NA 141 141  ?K 3.7 3.5  ?CL 108 108  ?CO2 24 25  ?GLUCOSE 124* 106*  ?BUN 16 13  ?CREATININE 1.01* 0.92  ?CALCIUM 9.1 9.4  ? ? ?CBG: ?No results for input(s): GLUCAP in the last 168 hours. ? ?Recent Results (from the past 240 hour(s))  ?Blood culture (routine x 2)     Status: None (Preliminary result)  ? Collection Time: 06/15/21  7:32 PM  ? Specimen: BLOOD RIGHT ARM  ?Result Value Ref Range Status  ? Specimen Description BLOOD RIGHT ARM  Final  ? Special Requests   Final  ?  BOTTLES DRAWN AEROBIC AND ANAEROBIC Blood Culture results may not be optimal due to an inadequate volume of blood received in culture bottles  ? Culture   Final  ?  NO GROWTH < 12 HOURS ?Performed at [facility], [address], [place], [place] [address] ?  ? Report Status PENDING  Incomplete  ?Blood culture (routine x 2)     Status: None (Preliminary result)  ? Collection Time: 06/15/21  7:32 PM  ? Specimen: BLOOD RIGHT HAND  ?Result Value Ref Range Status  ? Specimen Description BLOOD RIGHT HAND  Final  ? Special Requests   Final  ?  BOTTLES DRAWN AEROBIC AND ANAEROBIC Blood Culture adequate volume  ? Culture   Final  ?  NO GROWTH < 12 HOURS ?Performed at [facility], [address], [place], [place] [address] ?  ? Report Status PENDING  Incomplete  ?  ? ?Radiology Studies: ?DG Thoracic Spine 2 View ? ?Result Date: 06/15/2021 ?CLINICAL DATA:  [place] from [employer]  Back pain. EXAM: THORACIC SPINE 2 VIEWS COMPARISON:  None. FINDINGS: Mild thoracolumbar scoliotic curvature. No thoracic region fracture is visible. Question failure of separation at T6 and T7. IMPRESSION: Scoliosis.  No acute fracture visible by radiography. Electronically Signed   By: Nelson Chimes M.D.   On: 06/15/2021 16:59  ? ?CT Head Wo Contrast ? ?Result Date: 06/15/2021 ?CLINICAL DATA:  Status post fall. EXAM: CT HEAD WITHOUT CONTRAST TECHNIQUE: Contiguous axial images were obtained from the base of the skull through the  vertex without intravenous contrast. RADIATION DOSE REDUCTION: This exam was performed according to the departmental dose-optimization program which includes automated exposure control, adjustment of the mA and/or kV according to patient size and/or use of iterative reconstruction technique. COMPARISON:  None. FINDINGS: Brain: There is mild cerebral atrophy with widening of the extra-axial spaces and ventricular dilatation. There are areas of decreased attenuation within the white matter tracts of the supratentorial brain, consistent with microvascular disease changes. Vascular: No hyperdense vessel or unexpected calcification. Skull: Normal. Negative for fracture or focal lesion. Sinuses/Orbits: Acute fracture deformities are seen along the anterior and posterior walls of the right maxillary sinus. A large right maxillary sinus air-fluid level is also noted. Other: A small amount of soft tissue air seen along the anterior wall the right maxillary sinus. IMPRESSION: 1. Acute fracture deformities along the anterior and posterior walls of the right maxillary sinus. 2. Generalized cerebral atrophy. 3. No  acute intracranial abnormality. Electronically Signed   By: Virgina Norfolk M.D.   On: 06/15/2021 16:58  ? ?CT Cervical Spine Wo Contrast ? ?Result Date: 06/15/2021 ?CLINICAL DATA:  [place] from the porch.  Right facial fractures. EXAM: CT CERVICAL SPINE WITHOUT CONTRAST TECHNIQUE: Multidetector CT imaging of the cervical spine was performed without intravenous contrast. Multiplanar CT image reconstructions were also generated. RADIATION DOSE REDUCTION: This exam was performed according to the departmental dose-optimization program which includes automated exposure control, adjustment of the mA and/or kV according to patient size and/or use of iterative reconstruction technique. COMPARISON:  None. FINDINGS: Alignment: No malalignment. Skull base and vertebrae: No regional fracture. Soft tissues and spinal canal: No  traumatic soft tissue finding in the neck. Disc levels: The foramen magnum is widely patent. There is ordinary mild osteoarthritis of the C1-2 articulation but no encroachment upon the neural structures. Ordinary mild

## 2021-06-17 DIAGNOSIS — M069 Rheumatoid arthritis, unspecified: Secondary | ICD-10-CM | POA: Diagnosis not present

## 2021-06-17 DIAGNOSIS — S32501D Unspecified fracture of right pubis, subsequent encounter for fracture with routine healing: Secondary | ICD-10-CM

## 2021-06-17 DIAGNOSIS — E559 Vitamin D deficiency, unspecified: Secondary | ICD-10-CM | POA: Diagnosis present

## 2021-06-17 DIAGNOSIS — S0240CD Maxillary fracture, right side, subsequent encounter for fracture with routine healing: Secondary | ICD-10-CM

## 2021-06-17 DIAGNOSIS — N390 Urinary tract infection, site not specified: Secondary | ICD-10-CM | POA: Diagnosis not present

## 2021-06-17 MED ORDER — VITAMIN D (ERGOCALCIFEROL) 1.25 MG (50000 UNIT) PO CAPS
50000.0000 [IU] | ORAL_CAPSULE | ORAL | Status: DC
  Administered 2021-06-17: 50000 [IU] via ORAL
  Filled 2021-06-17: qty 1

## 2021-06-17 NOTE — Assessment & Plan Note (Signed)
CT scan ordered by Dr. Aline Brochure.  Nonoperative.  He recommends WB with walker for 6 weeks.   ?Follow up with ortho in 6 weeks for xrays.   ?

## 2021-06-17 NOTE — TOC Progression Note (Addendum)
Transition of Care (TOC) - Progression Note  ? ? ?Patient Details  ?Name: Wanda Cohen ?MRN: [id number] ?Date of Birth: 1934-04-17 ? ?Transition of Care (TOC) CM/SW Contact  ?Boneta Lucks, RN ?Phone Number: ?06/17/2021, 3:43 PM ? ?Clinical Narrative:   [facility] will accept patient tomorrow. ? Please call report to [phone] ext [phone].  TOC to fax DC summary to [facility] [phone].  Call Rhonda in Admission  at ext [phone]. With any questions.  ? ?Transport - Explained to son our EMS may not take on Sunday, due to no convalescent trucks and 2 hours ago. He will make some calls in the [place] area to see if he can find transportation as well. ? ? ?Expected Discharge Plan: [place] ?Barriers to Discharge: SNF Pending bed offer, Continued Medical Work up ? ?Expected Discharge Plan and Services ?Expected Discharge Plan: [place] ?  ?  ?

## 2021-06-17 NOTE — Progress Notes (Signed)
?PROGRESS NOTE ? ? ?Wanda Cohen  KAJ:[id number] DOB: 02/06/1935 DOA: 06/15/2021 ?PCP: Manon Hilding, MD  ? ?Chief Complaint  ?Patient presents with  ? Fall  ? ?Level of care: Med-Surg ? ?Brief Admission History:  ?87 y.o. female, with past medical history of rheumatoid arthritis, on methotrexate and other immunomodulators, and hypothyroidism no other significant history, she presents to ED secondary to fall, she lives by herself, she was walking in her back porch, which is approximately 4 stairs where she got to the top, felt a little bit dizzy and fell over the porch landing on her right side including right hip and forearm, and right side of the face which was bruised, she denies any loss of consciousness, focal deficits, patient is not on any anticoagulation, she does report dysuria and polyuria for last few days. ?-In ED her work-up significant for positive UA, and white blood cell count was elevated at 14.4 imaging significant for right maxillary sinus fracture, and hip x-ray significant for  acute mildly displaced fracture of the junction of the right acetabulum and superior pubic ramus, both ENT and orthopedic recommended NONoperative management, [facility] hospitalist consulted to admit. PT evaluated and now recommending SNF rehab placement.  Ortho recommending weightbearing as tolerated and follow up 6 weeks in office for xray.  ? ?06/17/2021: Pt remains in pain but agreeable to rehab. Pt now WB with walker for 6 weeks per Dr. Aline Brochure.  Working on rehab placement.   ?  ?Assessment and Plan: ?* Pelvis fracture ([place]) ?CT confirms stable pelvic fracture and orthopedics Dr. Aline Brochure evaluated and recommending weightbearing as tolerated and follow up in office in 6 weeks for xray.   ?Pain management as ordered, adjust regimen as needed.  ?PT recommending SNF.   ?TOC consulted for SNF placement.    ? ?Vitamin D deficiency ?Vitamin D 50k IU cap once weekly ordered.  ? ?Pelvic fracture ([place]) ?CT scan ordered by Dr.  Aline Brochure.  Nonoperative.  He recommends WB with walker for 6 weeks.   ?Follow up with ortho in 6 weeks for xrays.   ? ?UTI (urinary tract infection) ?Continue IV ceftriaxone ?Follow culture  ? ?Right maxillary fracture ([place]) ?ENT Dr. Benjamine Mola consulted and recommended nonoperative management and follow up in office for recheck.  ? ?Hypothyroidism ?Resumed home levothyroxine.  ? ?Rheumatoid arthritis ([place]) ?Symptoms exacerbated by fall.  Schedule acetaminophen every 6 hours.   ? ? ?DVT prophylaxis: enoxaparin ?Code Status: DNR  ?Family Communication:  ?Disposition: Status is: Inpatient  ? ?  ?Consultants:  ?Ortho ?ENT ?Procedures:  ? ?Antimicrobials:  ?Ceftriaxone 3/16>>  ?Subjective: ?Pt reports that she is in severe pain, barely able to move due to pain.   ?Objective: ?Vitals:  ? 06/16/21 0435 06/16/21 1415 06/16/21 2120 06/17/21 0511  ?BP: (!) 152/53 (!) 156/72 (!) 176/79 (!) 150/67  ?Pulse: 92 90 86 90  ?Resp: '17 18 20 17  '$ ?Temp: 98.2 ?F (36.8 ?C) 98.2 ?F (36.8 ?C) 97.6 ?F (36.4 ?C) 98.4 ?F (36.9 ?C)  ?TempSrc: Oral Oral Oral Oral  ?SpO2: 95% 95% 98% 94%  ?Weight:      ?Height:      ? ? ?Intake/Output Summary (Last 24 hours) at 06/17/2021 1341 ?Last data filed at 06/17/2021 0900 ?Gross per 24 hour  ?Intake 517.39 ml  ?Output 900 ml  ?Net -382.61 ml  ? ?Filed Weights  ? 06/15/21 1427  ?Weight: 60.3 kg  ? ?Examination: ? ?General exam: Appears calm and uncomfortable, sitting up in chair.  ?Respiratory system: Clear  to auscultation. Respiratory effort normal. ?Cardiovascular system: normal S1 & S2 heard. No JVD, murmurs, rubs, gallops or clicks. No pedal edema. ?Gastrointestinal system: Abdomen is nondistended, soft and nontender. No organomegaly or masses felt. Normal bowel sounds heard. ?Central nervous system: Alert and oriented. No focal neurological deficits. ?Extremities: diffuse deformities of RA in multiple joints. ?Skin: No rashes, lesions or ulcers. ?Psychiatry: Judgement and insight appear normal. Mood & affect  appropriate.  ? ?Data Reviewed: I have personally reviewed following labs and imaging studies ? ?CBC: ?Recent Labs  ?Lab 06/15/21 ?1659 06/16/21 ?0629  ?WBC 14.4* 12.6*  ?NEUTROABS 12.2*  --   ?HGB 10.6* 10.9*  ?HCT 34.7* 34.8*  ?MCV 98.0 97.5  ?PLT 203 184  ? ? ?Basic Metabolic Panel: ?Recent Labs  ?Lab 06/15/21 ?1659 06/16/21 ?0629  ?NA 141 141  ?K 3.7 3.5  ?CL 108 108  ?CO2 24 25  ?GLUCOSE 124* 106*  ?BUN 16 13  ?CREATININE 1.01* 0.92  ?CALCIUM 9.1 9.4  ? ? ?CBG: ?No results for input(s): GLUCAP in the last 168 hours. ? ?Recent Results (from the past 240 hour(s))  ?Urine Culture     Status: Abnormal (Preliminary result)  ? Collection Time: 06/15/21  6:41 PM  ? Specimen: Urine, Catheterized  ?Result Value Ref Range Status  ? Specimen Description   Final  ?  URINE, CATHETERIZED ?Performed at [facility], [address], [place], [place] [address] ?  ? Special Requests   Final  ?  NONE ?Performed at [facility], [address], [place], [place] [address] ?  ? Culture (A)  Final  ?  >=100,000 COLONIES/mL ESCHERICHIA COLI ?SUSCEPTIBILITIES TO FOLLOW ?Performed at [facility], [place] [address], [place], [place] [address] ?  ? Report Status PENDING  Incomplete  ?Blood culture (routine x 2)     Status: None (Preliminary result)  ? Collection Time: 06/15/21  7:32 PM  ? Specimen: BLOOD RIGHT ARM  ?Result Value Ref Range Status  ? Specimen Description BLOOD RIGHT ARM  Final  ? Special Requests   Final  ?  BOTTLES DRAWN AEROBIC AND ANAEROBIC Blood Culture results may not be optimal due to an inadequate volume of blood received in culture bottles  ? Culture   Final  ?  NO GROWTH 2 DAYS ?Performed at [facility], [address] [place], [place] [address] ?  ? Report Status PENDING  Incomplete  ?Blood culture (routine x 2)     Status: None (Preliminary result)  ? Collection Time: 06/15/21  7:32 PM  ? Specimen: BLOOD RIGHT HAND  ?Result Value Ref Range Status  ? Specimen Description BLOOD RIGHT HAND  Final  ? Special  Requests   Final  ?  BOTTLES DRAWN AEROBIC AND ANAEROBIC Blood Culture adequate volume  ? Culture   Final  ?  NO GROWTH 2 DAYS ?Performed at [facility], [address] [place], [place] [address] ?  ? Report Status PENDING  Incomplete  ?  ? ?Radiology Studies: ?DG Thoracic Spine 2 View ? ?Result Date: 06/15/2021 ?CLINICAL DATA:  [place] from [employer]  Back pain. EXAM: THORACIC SPINE 2 VIEWS COMPARISON:  None. FINDINGS: Mild thoracolumbar scoliotic curvature. No thoracic region fracture is visible. Question failure of separation at T6 and T7. IMPRESSION: Scoliosis.  No acute fracture visible by radiography. Electronically Signed   By: Nelson Chimes M.D.   On: 06/15/2021 16:59  ? ?CT Head Wo Contrast ? ?Result Date: 06/15/2021 ?CLINICAL DATA:  Status post fall. EXAM: CT  HEAD WITHOUT CONTRAST TECHNIQUE: Contiguous axial images were obtained from the base of the skull through the vertex without intravenous contrast. RADIATION DOSE REDUCTION: This exam was performed according to the departmental dose-optimization program which includes automated exposure control, adjustment of the mA and/or kV according to patient size and/or use of iterative reconstruction technique. COMPARISON:  None. FINDINGS: Brain: There is mild cerebral atrophy with widening of the extra-axial spaces and ventricular dilatation. There are areas of decreased attenuation within the white matter tracts of the supratentorial brain, consistent with microvascular disease changes. Vascular: No hyperdense vessel or unexpected calcification. Skull: Normal. Negative for fracture or focal lesion. Sinuses/Orbits: Acute fracture deformities are seen along the anterior and posterior walls of the right maxillary sinus. A large right maxillary sinus air-fluid level is also noted. Other: A small amount of soft tissue air seen along the anterior wall the right maxillary sinus. IMPRESSION: 1. Acute fracture deformities along the anterior and posterior walls of the right  maxillary sinus. 2. Generalized cerebral atrophy. 3. No acute intracranial abnormality. Electronically Signed   By: Virgina Norfolk M.D.   On: 06/15/2021 16:58  ? ?CT Cervical Spine Wo Contrast ? ?Result Da

## 2021-06-17 NOTE — Assessment & Plan Note (Signed)
Vitamin D 50k IU cap once weekly ordered.  ?

## 2021-06-18 DIAGNOSIS — M069 Rheumatoid arthritis, unspecified: Secondary | ICD-10-CM | POA: Diagnosis not present

## 2021-06-18 DIAGNOSIS — S32501D Unspecified fracture of right pubis, subsequent encounter for fracture with routine healing: Secondary | ICD-10-CM | POA: Diagnosis not present

## 2021-06-18 DIAGNOSIS — N390 Urinary tract infection, site not specified: Secondary | ICD-10-CM | POA: Diagnosis not present

## 2021-06-18 DIAGNOSIS — E039 Hypothyroidism, unspecified: Secondary | ICD-10-CM | POA: Diagnosis not present

## 2021-06-18 LAB — URINE CULTURE: Culture: >=100000 — AB

## 2021-06-18 MED ORDER — METHOTREXATE 2.5 MG PO TABS
17.5000 mg | ORAL_TABLET | ORAL | Status: DC
  Administered 2021-06-18: 17.5 mg via ORAL
  Filled 2021-06-18: qty 7

## 2021-06-18 NOTE — Progress Notes (Signed)
?PROGRESS NOTE ? ? ?Wanda Cohen  IOE:[id number] DOB: 08/05/1934 DOA: 06/15/2021 ?PCP: Manon Hilding, MD  ? ?Chief Complaint  ?Patient presents with  ? Fall  ? ?Level of care: Med-Surg ? ?Brief Admission History:  ?87 y.o. female, with past medical history of rheumatoid arthritis, on methotrexate and other immunomodulators, and hypothyroidism no other significant history, she presents to ED secondary to fall, she lives by herself, she was walking in her back porch, which is approximately 4 stairs where she got to the top, felt a little bit dizzy and fell over the porch landing on her right side including right hip and forearm, and right side of the face which was bruised, she denies any loss of consciousness, focal deficits, patient is not on any anticoagulation, she does report dysuria and polyuria for last few days. ?-In ED her work-up significant for positive UA, and white blood cell count was elevated at 14.4 imaging significant for right maxillary sinus fracture, and hip x-ray significant for  acute mildly displaced fracture of the junction of the right acetabulum and superior pubic ramus, both ENT and orthopedic recommended NONoperative management, [facility] hospitalist consulted to admit. PT evaluated and now recommending SNF rehab placement.  Ortho recommending weightbearing as tolerated and follow up 6 weeks in office for xray.  ? ?06/17/2021: Pt remains in pain but agreeable to rehab. Pt now WB with walker for 6 weeks per Dr. Aline Brochure.  Working on rehab placement.   ? ?06/18/2021:  Pt unable to get transportation to her rehab facility today.  Likely can discharge on 3/20 when transportation has been arranged.   ?  ?Assessment and Plan: ?* Pelvis fracture ([place]) ?CT confirms stable pelvic fracture and orthopedics Dr. Aline Brochure evaluated and recommending weightbearing as tolerated and follow up in office in 6 weeks for xray.   ?Pain management as ordered, adjust regimen as needed.  ?PT recommending SNF.   ?TOC  consulted for SNF placement.    ? ?Vitamin D deficiency ?Vitamin D 50k IU cap once weekly ordered.  ? ?Pelvic fracture ([place]) ?CT scan ordered by Dr. Aline Brochure.  Nonoperative.  He recommends WB with walker for 6 weeks.   ?Follow up with ortho in 6 weeks for xrays.   ? ?UTI (urinary tract infection) ?Continue IV ceftriaxone ?Follow culture  ? ?Right maxillary fracture ([place]) ?ENT Dr. Benjamine Mola consulted and recommended nonoperative management and follow up in office for recheck.  ? ?Hypothyroidism ?Resumed home levothyroxine.  ? ?Rheumatoid arthritis ([place]) ?Symptoms exacerbated by fall.  Schedule acetaminophen every 6 hours.   Resume home weekly methotrexate.  ? ? ?DVT prophylaxis: enoxaparin ?Code Status: DNR  ?Family Communication:  ?Disposition: Status is: Inpatient  ? ?  ?Consultants:  ?Ortho ?ENT ?Procedures:  ? ?Antimicrobials:  ?Ceftriaxone 3/16>>  ?Subjective: ?Pt reports that she is in severe pain, barely able to move due to pain.   ?Objective: ?Vitals:  ? 06/17/21 0511 06/17/21 1422 06/17/21 2054 06/18/21 0501  ?BP: (!) 150/67 (!) 157/60 (!) 153/60 (!) 154/68  ?Pulse: 90 93 96 82  ?Resp: '17 18 20 20  '$ ?Temp: 98.4 ?F (36.9 ?C) 98.1 ?F (36.7 ?C) 98.3 ?F (36.8 ?C) 97.8 ?F (36.6 ?C)  ?TempSrc: Oral Oral Oral Oral  ?SpO2: 94% 97% 97% 96%  ?Weight:      ?Height:      ? ? ?Intake/Output Summary (Last 24 hours) at 06/18/2021 1357 ?Last data filed at 06/18/2021 1829 ?Gross per 24 hour  ?Intake 480 ml  ?Output 400 ml  ?Net 80  ml  ? ?Filed Weights  ? 06/15/21 1427  ?Weight: 60.3 kg  ? ?Examination: ? ?General exam: Appears calm and uncomfortable, sitting up in chair.  ?Respiratory system: Clear to auscultation. Respiratory effort normal. ?Cardiovascular system: normal S1 & S2 heard. No JVD, murmurs, rubs, gallops or clicks. No pedal edema. ?Gastrointestinal system: Abdomen is nondistended, soft and nontender. No organomegaly or masses felt. Normal bowel sounds heard. ?Central nervous system: Alert and oriented. No focal  neurological deficits. ?Extremities: diffuse deformities of RA in multiple joints. ?Skin: No rashes, lesions or ulcers. ?Psychiatry: Judgement and insight appear normal. Mood & affect appropriate.  ? ?Data Reviewed: I have personally reviewed following labs and imaging studies ? ?CBC: ?Recent Labs  ?Lab 06/15/21 ?1659 06/16/21 ?0629  ?WBC 14.4* 12.6*  ?NEUTROABS 12.2*  --   ?HGB 10.6* 10.9*  ?HCT 34.7* 34.8*  ?MCV 98.0 97.5  ?PLT 203 184  ? ? ?Basic Metabolic Panel: ?Recent Labs  ?Lab 06/15/21 ?1659 06/16/21 ?0629  ?NA 141 141  ?K 3.7 3.5  ?CL 108 108  ?CO2 24 25  ?GLUCOSE 124* 106*  ?BUN 16 13  ?CREATININE 1.01* 0.92  ?CALCIUM 9.1 9.4  ? ? ?CBG: ?No results for input(s): GLUCAP in the last 168 hours. ? ?Recent Results (from the past 240 hour(s))  ?Urine Culture     Status: Abnormal  ? Collection Time: 06/15/21  6:41 PM  ? Specimen: Urine, Catheterized  ?Result Value Ref Range Status  ? Specimen Description   Final  ?  URINE, CATHETERIZED ?Performed at [facility], [address] [place], [place] [address] ?  ? Special Requests   Final  ?  NONE ?Performed at [facility], [address], [place], [place] [address] ?  ? Culture >=100,000 COLONIES/mL ESCHERICHIA COLI (A)  Final  ? Report Status 06/18/2021 FINAL  Final  ? Organism ID, Bacteria ESCHERICHIA COLI (A)  Final  ?    Susceptibility  ? Escherichia coli - MIC*  ?  AMPICILLIN >=32 RESISTANT Resistant   ?  CEFAZOLIN <=4 SENSITIVE Sensitive   ?  CEFEPIME <=0.12 SENSITIVE Sensitive   ?  CEFTRIAXONE <=0.25 SENSITIVE Sensitive   ?  CIPROFLOXACIN >=4 RESISTANT Resistant   ?  GENTAMICIN <=1 SENSITIVE Sensitive   ?  IMIPENEM 0.5 SENSITIVE Sensitive   ?  NITROFURANTOIN <=16 SENSITIVE Sensitive   ?  TRIMETH/SULFA <=20 SENSITIVE Sensitive   ?  AMPICILLIN/SULBACTAM 8 SENSITIVE Sensitive   ?  PIP/TAZO 64 INTERMEDIATE Intermediate   ?  * >=100,000 COLONIES/mL ESCHERICHIA COLI  ?Blood culture (routine x 2)     Status: None (Preliminary result)  ? Collection Time: 06/15/21   7:32 PM  ? Specimen: BLOOD RIGHT ARM  ?Result Value Ref Range Status  ? Specimen Description BLOOD RIGHT ARM  Final  ? Special Requests   Final  ?  BOTTLES DRAWN AEROBIC AND ANAEROBIC Blood Culture results may not be optimal due to an inadequate volume of blood received in culture bottles  ? Culture   Final  ?  NO GROWTH 2 DAYS ?Performed at [facility], [address] [place], [place] [address] ?  ? Report Status PENDING  Incomplete  ?Blood culture (routine x 2)     Status: None (Preliminary result)  ? Collection Time: 06/15/21  7:32 PM  ? Specimen: BLOOD RIGHT HAND  ?Result Value Ref Range Status  ? Specimen Description BLOOD RIGHT HAND  Final  ? Special Requests   Final  ?  BOTTLES DRAWN AEROBIC AND ANAEROBIC  Blood Culture adequate volume  ? Culture   Final  ?  NO GROWTH 2 DAYS ?Performed at [facility], [address] [place], [place] [address] ?  ? Report Status PENDING  Incomplete  ?  ? ?Radiology Studies: ?CT PELVIS WO CONTRAST ? ?Result Date: 06/16/2021 ?CLINICAL DATA:  Further evaluation for right pelvic fractures. EXAM: CT PELVIS WITHOUT CONTRAST THREE DIMENSIONAL CT IMAGE RENDERING ON ACQUISITION WORKSTATION TECHNIQUE: Multidetector CT imaging of the pelvis was performed following the standard protocol without intravenous contrast. RADIATION DOSE REDUCTION: This exam was performed according to the departmental dose-optimization program which includes automated exposure control, adjustment of the mA and/or kV according to patient size and/or use of iterative reconstruction technique. COMPARISON:  06/15/2021 FINDINGS: Musculoskeletal: Nondisplaced, non comminuted fracture at the junction of the right pubis and anteromedial acetabulum, as noted on current radiographs. Acute nondisplaced, non comminuted fracture of the ischium along the inferior margin of the inferior pubic ramus. No other fractures.  No bone lesions. Mild bilateral hip joint degenerative changes with small marginal osteophytes from the  bases of the femoral heads. SI joints and pubic symphysis are normally aligned. Degenerative changes are noted lumbar spine. No bone lesions. Urinary Tract: 1 mm stone lies along the right posterior bladder near the r

## 2021-06-19 DIAGNOSIS — R0789 Other chest pain: Secondary | ICD-10-CM | POA: Diagnosis not present

## 2021-06-19 DIAGNOSIS — M25551 Pain in right hip: Secondary | ICD-10-CM | POA: Diagnosis not present

## 2021-06-19 DIAGNOSIS — I10 Essential (primary) hypertension: Secondary | ICD-10-CM | POA: Diagnosis not present

## 2021-06-19 DIAGNOSIS — S32591A Other specified fracture of right pubis, initial encounter for closed fracture: Secondary | ICD-10-CM | POA: Diagnosis not present

## 2021-06-19 DIAGNOSIS — Z7401 Bed confinement status: Secondary | ICD-10-CM | POA: Diagnosis not present

## 2021-06-19 DIAGNOSIS — S32401D Unspecified fracture of right acetabulum, subsequent encounter for fracture with routine healing: Secondary | ICD-10-CM | POA: Diagnosis not present

## 2021-06-19 DIAGNOSIS — S02401A Maxillary fracture, unspecified, initial encounter for closed fracture: Secondary | ICD-10-CM | POA: Diagnosis not present

## 2021-06-19 DIAGNOSIS — S0240CD Maxillary fracture, right side, subsequent encounter for fracture with routine healing: Secondary | ICD-10-CM | POA: Diagnosis not present

## 2021-06-19 DIAGNOSIS — R1312 Dysphagia, oropharyngeal phase: Secondary | ICD-10-CM | POA: Diagnosis not present

## 2021-06-19 DIAGNOSIS — S32511D Fracture of superior rim of right pubis, subsequent encounter for fracture with routine healing: Secondary | ICD-10-CM | POA: Diagnosis not present

## 2021-06-19 DIAGNOSIS — R102 Pelvic and perineal pain: Secondary | ICD-10-CM | POA: Diagnosis not present

## 2021-06-19 DIAGNOSIS — E559 Vitamin D deficiency, unspecified: Secondary | ICD-10-CM | POA: Diagnosis not present

## 2021-06-19 DIAGNOSIS — R262 Difficulty in walking, not elsewhere classified: Secondary | ICD-10-CM | POA: Diagnosis not present

## 2021-06-19 DIAGNOSIS — M791 Myalgia, unspecified site: Secondary | ICD-10-CM | POA: Diagnosis not present

## 2021-06-19 DIAGNOSIS — M79604 Pain in right leg: Secondary | ICD-10-CM | POA: Diagnosis not present

## 2021-06-19 DIAGNOSIS — Z9181 History of falling: Secondary | ICD-10-CM | POA: Diagnosis not present

## 2021-06-19 DIAGNOSIS — M25511 Pain in right shoulder: Secondary | ICD-10-CM | POA: Diagnosis not present

## 2021-06-19 DIAGNOSIS — E039 Hypothyroidism, unspecified: Secondary | ICD-10-CM | POA: Diagnosis not present

## 2021-06-19 DIAGNOSIS — R279 Unspecified lack of coordination: Secondary | ICD-10-CM | POA: Diagnosis not present

## 2021-06-19 DIAGNOSIS — K5903 Drug induced constipation: Secondary | ICD-10-CM | POA: Diagnosis not present

## 2021-06-19 DIAGNOSIS — Z7189 Other specified counseling: Secondary | ICD-10-CM | POA: Diagnosis not present

## 2021-06-19 DIAGNOSIS — N390 Urinary tract infection, site not specified: Secondary | ICD-10-CM | POA: Diagnosis not present

## 2021-06-19 DIAGNOSIS — S329XXD Fracture of unspecified parts of lumbosacral spine and pelvis, subsequent encounter for fracture with routine healing: Secondary | ICD-10-CM | POA: Diagnosis not present

## 2021-06-19 DIAGNOSIS — Z20822 Contact with and (suspected) exposure to covid-19: Secondary | ICD-10-CM | POA: Diagnosis not present

## 2021-06-19 DIAGNOSIS — M069 Rheumatoid arthritis, unspecified: Secondary | ICD-10-CM | POA: Diagnosis not present

## 2021-06-19 DIAGNOSIS — S32501D Unspecified fracture of right pubis, subsequent encounter for fracture with routine healing: Secondary | ICD-10-CM | POA: Diagnosis not present

## 2021-06-19 DIAGNOSIS — R040 Epistaxis: Secondary | ICD-10-CM | POA: Diagnosis not present

## 2021-06-19 DIAGNOSIS — M79662 Pain in left lower leg: Secondary | ICD-10-CM | POA: Diagnosis not present

## 2021-06-19 DIAGNOSIS — M25552 Pain in left hip: Secondary | ICD-10-CM | POA: Diagnosis not present

## 2021-06-19 DIAGNOSIS — N3000 Acute cystitis without hematuria: Secondary | ICD-10-CM | POA: Diagnosis not present

## 2021-06-19 MED ORDER — POLYETHYLENE GLYCOL 3350 17 G PO PACK: 17.0000 g | PACK | Freq: Every day | ORAL | 0 refills | Status: DC

## 2021-06-19 MED ORDER — OXYCODONE HCL 5 MG PO TABS: 5.0000 mg | ORAL_TABLET | Freq: Four times a day (QID) | ORAL | 0 refills | Status: AC | PRN

## 2021-06-19 MED ORDER — POLYETHYLENE GLYCOL 3350 17 G PO PACK
17.0000 g | PACK | Freq: Every day | ORAL | Status: DC
  Administered 2021-06-19: 17 g via ORAL
  Filled 2021-06-19: qty 1

## 2021-06-19 MED ORDER — ACETAMINOPHEN 325 MG PO TABS: 650.0000 mg | ORAL_TABLET | Freq: Four times a day (QID) | ORAL | Status: DC

## 2021-06-19 MED ORDER — ONDANSETRON HCL 4 MG PO TABS
4.0000 mg | ORAL_TABLET | Freq: Four times a day (QID) | ORAL | 0 refills | Status: DC | PRN
Start: 2021-06-19 — End: 2022-01-01

## 2021-06-19 MED ORDER — VITAMIN D (ERGOCALCIFEROL) 1.25 MG (50000 UNIT) PO CAPS: 50000.0000 [IU] | ORAL_CAPSULE | ORAL | 0 refills | Status: AC

## 2021-06-19 MED ORDER — SENNOSIDES-DOCUSATE SODIUM 8.6-50 MG PO TABS: 2.0000 | ORAL_TABLET | Freq: Every day | ORAL | Status: DC

## 2021-06-19 NOTE — Discharge Summary (Signed)
Physician Discharge Summary  ?Zaela Graley TZG:[id number] DOB: January 13, 1935 DOA: 06/15/2021 ? ?PCP: Manon Hilding, MD ?Orthopedics: Dr. Ruthe Mannan ?ENT: Dr. Benjamine Mola ? ?Admit date: 06/15/2021 ?Discharge date: 06/19/2021 ? ?Disposition:  SNF ? ?Recommendations for Outpatient Follow-up:  ?Follow up with Dr. Aline Brochure orthopedics in 6 weeks for xrays ?Follow up with ENT Dr. Benjamine Mola in 2-3 weeks for follow up maxillary sinus fracture ?Ortho recommendation: Protected weight bearing with walker for 6 weeks ?Please follow up with PCP in 2 weeks.  ? ?Discharge Condition: STABLE   ?CODE STATUS: DNR ?DIET:  regular  ? ?Brief Hospitalization Summary: ?Please see all hospital notes, images, labs for full details of the hospitalization. ?87 y.o. female, with past medical history of rheumatoid arthritis, on methotrexate and other immunomodulators, and hypothyroidism no other significant history, she presents to ED secondary to fall, she lives by herself, she was walking in her back porch, which is approximately 4 stairs where she got to the top, felt a little bit dizzy and fell over the porch landing on her right side including right hip and forearm, and right side of the face which was bruised, she denies any loss of consciousness, focal deficits, patient is not on any anticoagulation, she does report dysuria and polyuria for last few days. ?-In ED her work-up significant for positive UA, and white blood cell count was elevated at 14.4 imaging significant for right maxillary sinus fracture, and hip x-ray significant for  acute mildly displaced fracture of the junction of the right acetabulum and superior pubic ramus, both ENT and orthopedic recommended NONoperative management, [facility] hospitalist consulted to admit. PT evaluated and now recommending SNF rehab placement.  Ortho recommending weightbearing as tolerated and follow up 6 weeks in office for xray.  ? ?06/17/2021: Pt remains in pain but agreeable to rehab. Pt now WB with walker for  6 weeks per Dr. Aline Brochure.  Working on rehab placement.   ? ?06/18/2021:  Pt unable to get transportation to her rehab facility today.  Likely can discharge on 3/20 when transportation has been arranged.   ? ?06/19/2021:  Pt reports ambulating with assistance and walker.  Agreeable to SNF rehab.  Transportation arranged.  DC today.   ? ?HOSPITAL COURSE BY PROBLEM LIST ? ?Assessment and Plan: ?* Pelvis fracture ([place]) ?CT confirms stable pelvic fracture and orthopedics Dr. Aline Brochure evaluated and recommending weightbearing as tolerated and follow up in office in 6 weeks for xray with Dr. Aline Brochure.    ?Pain management as ordered, adjust regimen as needed.  ?PT recommending SNF.   ?TOC consulted for SNF placement.   Arrangements made.   ? ?Vitamin D deficiency ?Vitamin D 50k IU cap once weekly ordered.  ? ?Pelvic fracture ([place]) ?CT scan ordered by Dr. Aline Brochure.  Nonoperative.  He recommends WB with walker for 6 weeks.   ?Follow up with ortho in 6 weeks for xrays.   ? ?E Coli UTI (urinary tract infection) ?FULLY TREATED with IV ceftriaxone ? ?Right maxillary fracture ([place]) ?ENT Dr. Benjamine Mola consulted and recommended nonoperative management and follow up in office for recheck in 2-3 weeks.  ? ?Hypothyroidism ?Resumed home levothyroxine.  ? ?Rheumatoid arthritis ([place]) ?Symptoms exacerbated by fall.  Schedule acetaminophen every 6 hours.   Resume home treatments.  ? ?Discharge Diagnoses:  ?Principal Problem: ?  Pelvis fracture ([place]) ?Active Problems: ?  Pelvic fracture ([place]) ?  Vitamin D deficiency ?  Right maxillary fracture ([place]) ?  E Coli UTI (urinary tract infection) ?  Rheumatoid arthritis ([place]) ?  Hypothyroidism ? ? ?Discharge Instructions: ? ?Allergies as of 06/19/2021   ?No Known Allergies ?  ? ?  ?Medication List  ?  ? ?STOP taking these medications   ? ?ADVIL PM PO ?  ?ibuprofen 200 MG tablet ?Commonly known as: ADVIL ?  ? ?  ? ?TAKE these medications   ? ?acetaminophen 325 MG tablet ?Commonly known as: TYLENOL ?Take 2  tablets (650 mg total) by mouth every 6 (six) hours. ?  ?folic acid 1 MG tablet ?Commonly known as: FOLVITE ?Take 1 mg by mouth daily. ?  ?levothyroxine 88 MCG tablet ?Commonly known as: SYNTHROID ?Take 88 mcg by mouth daily before breakfast. ?  ?LIQUID CALCIUM PO ?Take by mouth. ?  ?methotrexate 2.5 MG tablet ?Take by mouth See admin instructions. Take 7 tablets weekly on the same day. Sunday ?  ?ondansetron 4 MG tablet ?Commonly known as: ZOFRAN ?Take 1 tablet (4 mg total) by mouth every 6 (six) hours as needed for nausea. ?  ?oxyCODONE 5 MG immediate release tablet ?Commonly known as: Oxy IR/ROXICODONE ?Take 1 tablet (5 mg total) by mouth every 6 (six) hours as needed for up to 5 days for breakthrough pain or severe pain. ?  ?polyethylene glycol 17 g packet ?Commonly known as: MIRALAX / GLYCOLAX ?Take 17 g by mouth daily. ?Start taking on: June 20, 2021 ?  ?raloxifene 60 MG tablet ?Commonly known as: EVISTA ?Take 60 mg by mouth daily. ?  ?senna-docusate 8.6-50 MG tablet ?Commonly known as: Senokot-S ?Take 2 tablets by mouth at bedtime. ?  ?Simponi Aria 50 MG/4ML Soln injection ?Generic drug: golimumab ?'2mg'$ /kg ?  ?vitamin B-12 100 MCG tablet ?Commonly known as: CYANOCOBALAMIN ?See admin instructions. ?  ?Vitamin D (Ergocalciferol) 1.25 MG (50000 UNIT) Caps capsule ?Commonly known as: DRISDOL ?Take 1 capsule (50,000 Units total) by mouth every 7 (seven) days. ?Start taking on: June 24, 2021 ?  ? ?  ? ?  ?  ? ? ?  ?Durable Medical Equipment  ?(From admission, onward)  ?  ? ? ?  ? ?  Start     Ordered  ? 06/16/21 1114  For home use only DME Walker rolling  Once       ?Question Answer Comment  ?Walker: With 5 Inch Wheels   ?Patient needs a walker to treat with the following condition Gait instability   ?Patient needs a walker to treat with the following condition S/p left hip fracture   ?  ? 06/16/21 1113  ? ?  ?  ? ?  ? ? Follow-up Information   ? ? Carole Civil, MD Follow up in 6 week(s).   ?Specialties:  Orthopedic Surgery, Radiology ?Why: Hospital Follow Up hip fracture for xrays with orthopedics ?Contact information: ?[address] ?[place] [address] ?[phone] ? ? ?  ?  ? ? Leta Baptist, MD. Schedule an appointment as soon as possible for a visit in 2 week(s).   ?Specialty: Otolaryngology ?Why: Hospital Follow Up for maxillary sinus fracture ?Contact information: ?[place] ?[address] ?[place] [address] ?[phone] ? ? ?  ?  ? ? Sasser, Silvestre Moment, MD. Schedule an appointment as soon as possible for a visit in 1 month(s).   ?Specialty: Family Medicine ?Why: Hospital Follow Up ?Contact information: ?[address] ?[place] [address] ?[phone] ? ? ?  ?  ? ?  ?  ? ?  ? ?No Known Allergies ?Allergies as of 06/19/2021   ?No Known Allergies ?  ? ?  ?  Medication List  ?  ? ?STOP taking these medications   ? ?ADVIL PM PO ?  ?ibuprofen 200 MG tablet ?Commonly known as: ADVIL ?  ? ?  ? ?TAKE these medications   ? ?acetaminophen 325 MG tablet ?Commonly known as: TYLENOL ?Take 2 tablets (650 mg total) by mouth every 6 (six) hours. ?  ?folic acid 1 MG tablet ?Commonly known as: FOLVITE ?Take 1 mg by mouth daily. ?  ?levothyroxine 88 MCG tablet ?Commonly known as: SYNTHROID ?Take 88 mcg by mouth daily before breakfast. ?  ?LIQUID CALCIUM PO ?Take by mouth. ?  ?methotrexate 2.5 MG tablet ?Take by mouth See admin instructions. Take 7 tablets weekly on the same day. Sunday ?  ?ondansetron 4 MG tablet ?Commonly known as: ZOFRAN ?Take 1 tablet (4 mg total) by mouth every 6 (six) hours as needed for nausea. ?  ?oxyCODONE 5 MG immediate release tablet ?Commonly known as: Oxy IR/ROXICODONE ?Take 1 tablet (5 mg total) by mouth every 6 (six) hours as needed for up to 5 days for breakthrough pain or severe pain. ?  ?polyethylene glycol 17 g packet ?Commonly known as: MIRALAX / GLYCOLAX ?Take 17 g by mouth daily. ?Start taking on: June 20, 2021 ?  ?raloxifene 60 MG tablet ?Commonly known as: EVISTA ?Take 60 mg by mouth  daily. ?  ?senna-docusate 8.6-50 MG tablet ?Commonly known as: Senokot-S ?Take 2 tablets by mouth at bedtime. ?  ?Simponi Aria 50 MG/4ML Soln injection ?Generic drug: golimumab ?'2mg'$ /kg ?  ?vitamin B-12 100 MCG

## 2021-06-19 NOTE — TOC Transition Note (Signed)
Transition of Care (TOC) - CM/SW Discharge Note ? ? ?Patient Details  ?Name: Wanda Cohen ?MRN: [id number] ?Date of Birth: 1934-05-22 ? ?Transition of Care (TOC) CM/SW Contact:  ?Boneta Lucks, RN ?Phone Number: ?06/19/2021, 11:51 AM ? ? ?Clinical Narrative:  EMS has trucks today. Patient is medically ready to discharge to [facility] in [place], [place].  Wanda Cohen her son updated, RN called report. DC summary faxed to [facility]. Medical necessity printed, EMS scheduled.  ? ?Final next level of care: [place] ?Barriers to Discharge: Barriers Resolved ? ? ?Patient Goals and CMS Choice ?Patient states their goals for this hospitalization and ongoing recovery are:: to go to SNF ?CMS Medicare.gov Compare Post Acute Care list provided to:: Patient Represenative (must comment) ?Choice offered to / list presented to : Adult Children ? ?Discharge Placement ?  ?           ?  ?Patient to be transferred to facility by: EMS ?Name of family member notified: Son- Wanda Cohen ?Patient and family notified of of transfer: 06/19/21 ? ?Discharge Plan and Services ?  ? [place], [place] ? ? ?

## 2021-06-19 NOTE — Discharge Instructions (Signed)
ORTHOPEDIC INSTRUCTIONS ?Protected weight bearing with walker for 6 weeks  ?Please follow up with orthopedics Dr. Aline Brochure in 6 weeks for xrays and check up ? ? ?ENT Instructions ?Please follow up with ENT in 2-3 weeks for recheck of maxillary sinus fracture  ? ? ? ?IMPORTANT INFORMATION: PAY CLOSE ATTENTION  ? ?PHYSICIAN DISCHARGE INSTRUCTIONS ? ?Follow with Primary care provider  Sasser, Silvestre Moment, MD  and other consultants as instructed by your Hospitalist Physician ? ?SEEK MEDICAL CARE OR RETURN TO EMERGENCY ROOM IF SYMPTOMS COME BACK, WORSEN OR NEW PROBLEM DEVELOPS  ? ?Please note: ?You were cared for by a hospitalist during your hospital stay. Every effort will be made to forward records to your primary care provider.  You can request that your primary care provider send for your hospital records if they have not received them.  Once you are discharged, your primary care physician will handle any further medical issues. Please note that NO REFILLS for any discharge medications will be authorized once you are discharged, as it is imperative that you return to your primary care physician (or establish a relationship with a primary care physician if you do not have one) for your post hospital discharge needs so that they can reassess your need for medications and monitor your lab values. ? ?Please get a complete blood count and chemistry panel checked by your Primary MD at your next visit, and again as instructed by your Primary MD. ? ?Get Medicines reviewed and adjusted: ?Please take all your medications with you for your next visit with your Primary MD ? ?Laboratory/radiological data: ?Please request your Primary MD to go over all hospital tests and procedure/radiological results at the follow up, please ask your primary care provider to get all Hospital records sent to his/her office. ? ?In some cases, they will be blood work, cultures and biopsy results pending at the time of your discharge. Please request that  your primary care provider follow up on these results. ? ?If you are diabetic, please bring your blood sugar readings with you to your follow up appointment with primary care.   ? ?Please call and make your follow up appointments as soon as possible.   ? ?Also Note the following: ?If you experience worsening of your admission symptoms, develop shortness of breath, life threatening emergency, suicidal or homicidal thoughts you must seek medical attention immediately by calling 911 or calling your MD immediately  if symptoms less severe. ? ?You must read complete instructions/literature along with all the possible adverse reactions/side effects for all the Medicines you take and that have been prescribed to you. Take any new Medicines after you have completely understood and accpet all the possible adverse reactions/side effects.  ? ?Do not drive when taking Pain medications or sleeping medications (Benzodiazepines) ? ?Do not take more than prescribed Pain, Sleep and Anxiety Medications. It is not advisable to combine anxiety,sleep and pain medications without talking with your primary care practitioner ? ?Special Instructions: If you have smoked or chewed Tobacco  in the last 2 yrs please stop smoking, stop any regular Alcohol  and or any Recreational drug use. ? ?Wear Seat belts while driving.  Do not drive if taking any narcotic, mind altering or controlled substances or recreational drugs or alcohol.  ? ? ? ? ? ? ? ?

## 2021-06-19 NOTE — Progress Notes (Signed)
Attempted to call report to [place] ext. 0, no answer at this time. Will re-attempt to call. ?

## 2021-06-19 NOTE — Progress Notes (Signed)
Pt requeted PRN morphine before ambulating to [facility] to attempt bowel movement. Pt was able to ambulate with 2RN and Walker. Pt did not have bowel movement. Pt also requested PRN oxycodone after ambulation, pt reported no pain after medication. ?Louanne Skye ?06/19/21 ?12:46 AM ? ?

## 2021-06-19 NOTE — Progress Notes (Signed)
Pt discharged with all personal belongings and being transported to [facility] via EMS. ?

## 2021-06-19 NOTE — Progress Notes (Signed)
Report given to Lovena Le, Therapist, sports at [facility], [place]. ?

## 2021-06-20 DIAGNOSIS — S329XXD Fracture of unspecified parts of lumbosacral spine and pelvis, subsequent encounter for fracture with routine healing: Secondary | ICD-10-CM | POA: Diagnosis not present

## 2021-06-20 DIAGNOSIS — S0240CD Maxillary fracture, right side, subsequent encounter for fracture with routine healing: Secondary | ICD-10-CM | POA: Diagnosis not present

## 2021-06-20 DIAGNOSIS — M25551 Pain in right hip: Secondary | ICD-10-CM | POA: Diagnosis not present

## 2021-06-20 DIAGNOSIS — N3000 Acute cystitis without hematuria: Secondary | ICD-10-CM | POA: Diagnosis not present

## 2021-06-20 LAB — CULTURE, BLOOD (ROUTINE X 2)
Culture: NO GROWTH
Culture: NO GROWTH
Special Requests: ADEQUATE

## 2021-06-26 DIAGNOSIS — S329XXD Fracture of unspecified parts of lumbosacral spine and pelvis, subsequent encounter for fracture with routine healing: Secondary | ICD-10-CM | POA: Diagnosis not present

## 2021-06-26 DIAGNOSIS — N390 Urinary tract infection, site not specified: Secondary | ICD-10-CM | POA: Diagnosis not present

## 2021-06-26 DIAGNOSIS — S0240CD Maxillary fracture, right side, subsequent encounter for fracture with routine healing: Secondary | ICD-10-CM | POA: Diagnosis not present

## 2021-06-26 DIAGNOSIS — M069 Rheumatoid arthritis, unspecified: Secondary | ICD-10-CM | POA: Diagnosis not present

## 2021-06-27 DIAGNOSIS — M791 Myalgia, unspecified site: Secondary | ICD-10-CM | POA: Diagnosis not present

## 2021-06-27 DIAGNOSIS — R0789 Other chest pain: Secondary | ICD-10-CM | POA: Diagnosis not present

## 2021-06-27 DIAGNOSIS — R102 Pelvic and perineal pain: Secondary | ICD-10-CM | POA: Diagnosis not present

## 2021-06-28 DIAGNOSIS — M25551 Pain in right hip: Secondary | ICD-10-CM | POA: Diagnosis not present

## 2021-06-28 DIAGNOSIS — I10 Essential (primary) hypertension: Secondary | ICD-10-CM | POA: Diagnosis not present

## 2021-06-28 DIAGNOSIS — M791 Myalgia, unspecified site: Secondary | ICD-10-CM | POA: Diagnosis not present

## 2021-06-28 DIAGNOSIS — R102 Pelvic and perineal pain: Secondary | ICD-10-CM | POA: Diagnosis not present

## 2021-07-06 DIAGNOSIS — Z7189 Other specified counseling: Secondary | ICD-10-CM | POA: Diagnosis not present

## 2021-07-06 DIAGNOSIS — S02401A Maxillary fracture, unspecified, initial encounter for closed fracture: Secondary | ICD-10-CM | POA: Diagnosis not present

## 2021-07-07 DIAGNOSIS — M25551 Pain in right hip: Secondary | ICD-10-CM | POA: Diagnosis not present

## 2021-07-07 DIAGNOSIS — M79662 Pain in left lower leg: Secondary | ICD-10-CM | POA: Diagnosis not present

## 2021-07-11 DIAGNOSIS — S32591A Other specified fracture of right pubis, initial encounter for closed fracture: Secondary | ICD-10-CM | POA: Diagnosis not present

## 2021-07-11 DIAGNOSIS — M25552 Pain in left hip: Secondary | ICD-10-CM | POA: Diagnosis not present

## 2021-07-11 DIAGNOSIS — M25551 Pain in right hip: Secondary | ICD-10-CM | POA: Diagnosis not present

## 2021-07-14 DIAGNOSIS — R040 Epistaxis: Secondary | ICD-10-CM | POA: Diagnosis not present

## 2021-07-14 DIAGNOSIS — S0240CD Maxillary fracture, right side, subsequent encounter for fracture with routine healing: Secondary | ICD-10-CM | POA: Diagnosis not present

## 2021-07-19 DIAGNOSIS — M069 Rheumatoid arthritis, unspecified: Secondary | ICD-10-CM | POA: Diagnosis not present

## 2021-07-19 DIAGNOSIS — S329XXD Fracture of unspecified parts of lumbosacral spine and pelvis, subsequent encounter for fracture with routine healing: Secondary | ICD-10-CM | POA: Diagnosis not present

## 2021-07-19 DIAGNOSIS — S0240CD Maxillary fracture, right side, subsequent encounter for fracture with routine healing: Secondary | ICD-10-CM | POA: Diagnosis not present

## 2021-07-19 DIAGNOSIS — M25551 Pain in right hip: Secondary | ICD-10-CM | POA: Diagnosis not present

## 2021-07-21 DIAGNOSIS — E559 Vitamin D deficiency, unspecified: Secondary | ICD-10-CM | POA: Diagnosis not present

## 2021-07-21 DIAGNOSIS — M1990 Unspecified osteoarthritis, unspecified site: Secondary | ICD-10-CM | POA: Diagnosis not present

## 2021-07-21 DIAGNOSIS — I10 Essential (primary) hypertension: Secondary | ICD-10-CM | POA: Diagnosis not present

## 2021-07-21 DIAGNOSIS — S0240CD Maxillary fracture, right side, subsequent encounter for fracture with routine healing: Secondary | ICD-10-CM | POA: Diagnosis not present

## 2021-07-21 DIAGNOSIS — S32401D Unspecified fracture of right acetabulum, subsequent encounter for fracture with routine healing: Secondary | ICD-10-CM | POA: Diagnosis not present

## 2021-07-21 DIAGNOSIS — M419 Scoliosis, unspecified: Secondary | ICD-10-CM | POA: Diagnosis not present

## 2021-07-21 DIAGNOSIS — Z87440 Personal history of urinary (tract) infections: Secondary | ICD-10-CM | POA: Diagnosis not present

## 2021-07-21 DIAGNOSIS — M069 Rheumatoid arthritis, unspecified: Secondary | ICD-10-CM | POA: Diagnosis not present

## 2021-07-21 DIAGNOSIS — Z9181 History of falling: Secondary | ICD-10-CM | POA: Diagnosis not present

## 2021-07-21 DIAGNOSIS — G319 Degenerative disease of nervous system, unspecified: Secondary | ICD-10-CM | POA: Diagnosis not present

## 2021-07-21 DIAGNOSIS — M47812 Spondylosis without myelopathy or radiculopathy, cervical region: Secondary | ICD-10-CM | POA: Diagnosis not present

## 2021-07-21 DIAGNOSIS — Z85038 Personal history of other malignant neoplasm of large intestine: Secondary | ICD-10-CM | POA: Diagnosis not present

## 2021-07-21 DIAGNOSIS — Z9012 Acquired absence of left breast and nipple: Secondary | ICD-10-CM | POA: Diagnosis not present

## 2021-07-21 DIAGNOSIS — E039 Hypothyroidism, unspecified: Secondary | ICD-10-CM | POA: Diagnosis not present

## 2021-07-21 DIAGNOSIS — Z853 Personal history of malignant neoplasm of breast: Secondary | ICD-10-CM | POA: Diagnosis not present

## 2021-07-21 DIAGNOSIS — G609 Hereditary and idiopathic neuropathy, unspecified: Secondary | ICD-10-CM | POA: Diagnosis not present

## 2021-07-21 DIAGNOSIS — Z20822 Contact with and (suspected) exposure to covid-19: Secondary | ICD-10-CM | POA: Diagnosis not present

## 2021-07-21 DIAGNOSIS — S32591D Other specified fracture of right pubis, subsequent encounter for fracture with routine healing: Secondary | ICD-10-CM | POA: Diagnosis not present

## 2021-07-21 DIAGNOSIS — Z79899 Other long term (current) drug therapy: Secondary | ICD-10-CM | POA: Diagnosis not present

## 2021-07-24 DIAGNOSIS — E039 Hypothyroidism, unspecified: Secondary | ICD-10-CM | POA: Diagnosis not present

## 2021-07-24 DIAGNOSIS — M069 Rheumatoid arthritis, unspecified: Secondary | ICD-10-CM | POA: Diagnosis not present

## 2021-07-24 DIAGNOSIS — Z9181 History of falling: Secondary | ICD-10-CM | POA: Diagnosis not present

## 2021-07-24 DIAGNOSIS — Z853 Personal history of malignant neoplasm of breast: Secondary | ICD-10-CM | POA: Diagnosis not present

## 2021-07-24 DIAGNOSIS — E559 Vitamin D deficiency, unspecified: Secondary | ICD-10-CM | POA: Diagnosis not present

## 2021-07-24 DIAGNOSIS — S0240CD Maxillary fracture, right side, subsequent encounter for fracture with routine healing: Secondary | ICD-10-CM | POA: Diagnosis not present

## 2021-07-24 DIAGNOSIS — Z85038 Personal history of other malignant neoplasm of large intestine: Secondary | ICD-10-CM | POA: Diagnosis not present

## 2021-07-24 DIAGNOSIS — S32591D Other specified fracture of right pubis, subsequent encounter for fracture with routine healing: Secondary | ICD-10-CM | POA: Diagnosis not present

## 2021-07-24 DIAGNOSIS — G609 Hereditary and idiopathic neuropathy, unspecified: Secondary | ICD-10-CM | POA: Diagnosis not present

## 2021-07-24 DIAGNOSIS — S32401D Unspecified fracture of right acetabulum, subsequent encounter for fracture with routine healing: Secondary | ICD-10-CM | POA: Diagnosis not present

## 2021-07-24 DIAGNOSIS — M47812 Spondylosis without myelopathy or radiculopathy, cervical region: Secondary | ICD-10-CM | POA: Diagnosis not present

## 2021-07-25 DIAGNOSIS — S32401D Unspecified fracture of right acetabulum, subsequent encounter for fracture with routine healing: Secondary | ICD-10-CM | POA: Diagnosis not present

## 2021-07-25 DIAGNOSIS — M47812 Spondylosis without myelopathy or radiculopathy, cervical region: Secondary | ICD-10-CM | POA: Diagnosis not present

## 2021-07-25 DIAGNOSIS — S32591D Other specified fracture of right pubis, subsequent encounter for fracture with routine healing: Secondary | ICD-10-CM | POA: Diagnosis not present

## 2021-07-25 DIAGNOSIS — S0240CD Maxillary fracture, right side, subsequent encounter for fracture with routine healing: Secondary | ICD-10-CM | POA: Diagnosis not present

## 2021-07-25 DIAGNOSIS — G609 Hereditary and idiopathic neuropathy, unspecified: Secondary | ICD-10-CM | POA: Diagnosis not present

## 2021-07-25 DIAGNOSIS — M069 Rheumatoid arthritis, unspecified: Secondary | ICD-10-CM | POA: Diagnosis not present

## 2021-07-26 DIAGNOSIS — S0240CD Maxillary fracture, right side, subsequent encounter for fracture with routine healing: Secondary | ICD-10-CM | POA: Diagnosis not present

## 2021-07-26 DIAGNOSIS — S32591D Other specified fracture of right pubis, subsequent encounter for fracture with routine healing: Secondary | ICD-10-CM | POA: Diagnosis not present

## 2021-07-26 DIAGNOSIS — S32401D Unspecified fracture of right acetabulum, subsequent encounter for fracture with routine healing: Secondary | ICD-10-CM | POA: Diagnosis not present

## 2021-07-26 DIAGNOSIS — G609 Hereditary and idiopathic neuropathy, unspecified: Secondary | ICD-10-CM | POA: Diagnosis not present

## 2021-07-26 DIAGNOSIS — M069 Rheumatoid arthritis, unspecified: Secondary | ICD-10-CM | POA: Diagnosis not present

## 2021-07-26 DIAGNOSIS — M47812 Spondylosis without myelopathy or radiculopathy, cervical region: Secondary | ICD-10-CM | POA: Diagnosis not present

## 2021-07-28 DIAGNOSIS — S32591D Other specified fracture of right pubis, subsequent encounter for fracture with routine healing: Secondary | ICD-10-CM | POA: Diagnosis not present

## 2021-07-28 DIAGNOSIS — S0240CD Maxillary fracture, right side, subsequent encounter for fracture with routine healing: Secondary | ICD-10-CM | POA: Diagnosis not present

## 2021-07-28 DIAGNOSIS — G609 Hereditary and idiopathic neuropathy, unspecified: Secondary | ICD-10-CM | POA: Diagnosis not present

## 2021-07-28 DIAGNOSIS — M069 Rheumatoid arthritis, unspecified: Secondary | ICD-10-CM | POA: Diagnosis not present

## 2021-07-28 DIAGNOSIS — M47812 Spondylosis without myelopathy or radiculopathy, cervical region: Secondary | ICD-10-CM | POA: Diagnosis not present

## 2021-07-28 DIAGNOSIS — S32401D Unspecified fracture of right acetabulum, subsequent encounter for fracture with routine healing: Secondary | ICD-10-CM | POA: Diagnosis not present

## 2021-07-31 DIAGNOSIS — M0609 Rheumatoid arthritis without rheumatoid factor, multiple sites: Secondary | ICD-10-CM | POA: Diagnosis not present

## 2021-08-01 DIAGNOSIS — S32591D Other specified fracture of right pubis, subsequent encounter for fracture with routine healing: Secondary | ICD-10-CM | POA: Diagnosis not present

## 2021-08-01 DIAGNOSIS — G609 Hereditary and idiopathic neuropathy, unspecified: Secondary | ICD-10-CM | POA: Diagnosis not present

## 2021-08-01 DIAGNOSIS — M069 Rheumatoid arthritis, unspecified: Secondary | ICD-10-CM | POA: Diagnosis not present

## 2021-08-01 DIAGNOSIS — S0240CD Maxillary fracture, right side, subsequent encounter for fracture with routine healing: Secondary | ICD-10-CM | POA: Diagnosis not present

## 2021-08-01 DIAGNOSIS — S32401D Unspecified fracture of right acetabulum, subsequent encounter for fracture with routine healing: Secondary | ICD-10-CM | POA: Diagnosis not present

## 2021-08-01 DIAGNOSIS — M47812 Spondylosis without myelopathy or radiculopathy, cervical region: Secondary | ICD-10-CM | POA: Diagnosis not present

## 2021-08-02 DIAGNOSIS — S0240CD Maxillary fracture, right side, subsequent encounter for fracture with routine healing: Secondary | ICD-10-CM | POA: Diagnosis not present

## 2021-08-02 DIAGNOSIS — M069 Rheumatoid arthritis, unspecified: Secondary | ICD-10-CM | POA: Diagnosis not present

## 2021-08-02 DIAGNOSIS — S32401D Unspecified fracture of right acetabulum, subsequent encounter for fracture with routine healing: Secondary | ICD-10-CM | POA: Diagnosis not present

## 2021-08-02 DIAGNOSIS — M47812 Spondylosis without myelopathy or radiculopathy, cervical region: Secondary | ICD-10-CM | POA: Diagnosis not present

## 2021-08-02 DIAGNOSIS — S32591D Other specified fracture of right pubis, subsequent encounter for fracture with routine healing: Secondary | ICD-10-CM | POA: Diagnosis not present

## 2021-08-02 DIAGNOSIS — G609 Hereditary and idiopathic neuropathy, unspecified: Secondary | ICD-10-CM | POA: Diagnosis not present

## 2021-08-04 DIAGNOSIS — S0240CD Maxillary fracture, right side, subsequent encounter for fracture with routine healing: Secondary | ICD-10-CM | POA: Diagnosis not present

## 2021-08-04 DIAGNOSIS — M47812 Spondylosis without myelopathy or radiculopathy, cervical region: Secondary | ICD-10-CM | POA: Diagnosis not present

## 2021-08-04 DIAGNOSIS — M069 Rheumatoid arthritis, unspecified: Secondary | ICD-10-CM | POA: Diagnosis not present

## 2021-08-04 DIAGNOSIS — G609 Hereditary and idiopathic neuropathy, unspecified: Secondary | ICD-10-CM | POA: Diagnosis not present

## 2021-08-04 DIAGNOSIS — S32591D Other specified fracture of right pubis, subsequent encounter for fracture with routine healing: Secondary | ICD-10-CM | POA: Diagnosis not present

## 2021-08-04 DIAGNOSIS — S32401D Unspecified fracture of right acetabulum, subsequent encounter for fracture with routine healing: Secondary | ICD-10-CM | POA: Diagnosis not present

## 2021-08-05 DIAGNOSIS — S32401D Unspecified fracture of right acetabulum, subsequent encounter for fracture with routine healing: Secondary | ICD-10-CM | POA: Diagnosis not present

## 2021-08-05 DIAGNOSIS — G609 Hereditary and idiopathic neuropathy, unspecified: Secondary | ICD-10-CM | POA: Diagnosis not present

## 2021-08-05 DIAGNOSIS — S32591D Other specified fracture of right pubis, subsequent encounter for fracture with routine healing: Secondary | ICD-10-CM | POA: Diagnosis not present

## 2021-08-05 DIAGNOSIS — M47812 Spondylosis without myelopathy or radiculopathy, cervical region: Secondary | ICD-10-CM | POA: Diagnosis not present

## 2021-08-05 DIAGNOSIS — M069 Rheumatoid arthritis, unspecified: Secondary | ICD-10-CM | POA: Diagnosis not present

## 2021-08-05 DIAGNOSIS — S0240CD Maxillary fracture, right side, subsequent encounter for fracture with routine healing: Secondary | ICD-10-CM | POA: Diagnosis not present

## 2021-08-09 DIAGNOSIS — S32591D Other specified fracture of right pubis, subsequent encounter for fracture with routine healing: Secondary | ICD-10-CM | POA: Diagnosis not present

## 2021-08-09 DIAGNOSIS — M47812 Spondylosis without myelopathy or radiculopathy, cervical region: Secondary | ICD-10-CM | POA: Diagnosis not present

## 2021-08-09 DIAGNOSIS — S32401D Unspecified fracture of right acetabulum, subsequent encounter for fracture with routine healing: Secondary | ICD-10-CM | POA: Diagnosis not present

## 2021-08-09 DIAGNOSIS — M069 Rheumatoid arthritis, unspecified: Secondary | ICD-10-CM | POA: Diagnosis not present

## 2021-08-09 DIAGNOSIS — S0240CD Maxillary fracture, right side, subsequent encounter for fracture with routine healing: Secondary | ICD-10-CM | POA: Diagnosis not present

## 2021-08-09 DIAGNOSIS — G609 Hereditary and idiopathic neuropathy, unspecified: Secondary | ICD-10-CM | POA: Diagnosis not present

## 2021-08-10 DIAGNOSIS — M47812 Spondylosis without myelopathy or radiculopathy, cervical region: Secondary | ICD-10-CM | POA: Diagnosis not present

## 2021-08-10 DIAGNOSIS — G609 Hereditary and idiopathic neuropathy, unspecified: Secondary | ICD-10-CM | POA: Diagnosis not present

## 2021-08-10 DIAGNOSIS — S0240CD Maxillary fracture, right side, subsequent encounter for fracture with routine healing: Secondary | ICD-10-CM | POA: Diagnosis not present

## 2021-08-10 DIAGNOSIS — S32591D Other specified fracture of right pubis, subsequent encounter for fracture with routine healing: Secondary | ICD-10-CM | POA: Diagnosis not present

## 2021-08-10 DIAGNOSIS — M0609 Rheumatoid arthritis without rheumatoid factor, multiple sites: Secondary | ICD-10-CM | POA: Diagnosis not present

## 2021-08-10 DIAGNOSIS — M069 Rheumatoid arthritis, unspecified: Secondary | ICD-10-CM | POA: Diagnosis not present

## 2021-08-10 DIAGNOSIS — S32401D Unspecified fracture of right acetabulum, subsequent encounter for fracture with routine healing: Secondary | ICD-10-CM | POA: Diagnosis not present

## 2021-08-11 DIAGNOSIS — M47812 Spondylosis without myelopathy or radiculopathy, cervical region: Secondary | ICD-10-CM | POA: Diagnosis not present

## 2021-08-11 DIAGNOSIS — S0240CD Maxillary fracture, right side, subsequent encounter for fracture with routine healing: Secondary | ICD-10-CM | POA: Diagnosis not present

## 2021-08-11 DIAGNOSIS — G609 Hereditary and idiopathic neuropathy, unspecified: Secondary | ICD-10-CM | POA: Diagnosis not present

## 2021-08-11 DIAGNOSIS — S32591D Other specified fracture of right pubis, subsequent encounter for fracture with routine healing: Secondary | ICD-10-CM | POA: Diagnosis not present

## 2021-08-11 DIAGNOSIS — S32401D Unspecified fracture of right acetabulum, subsequent encounter for fracture with routine healing: Secondary | ICD-10-CM | POA: Diagnosis not present

## 2021-08-11 DIAGNOSIS — M069 Rheumatoid arthritis, unspecified: Secondary | ICD-10-CM | POA: Diagnosis not present

## 2021-08-14 DIAGNOSIS — Z79899 Other long term (current) drug therapy: Secondary | ICD-10-CM | POA: Diagnosis not present

## 2021-08-14 DIAGNOSIS — E559 Vitamin D deficiency, unspecified: Secondary | ICD-10-CM | POA: Diagnosis not present

## 2021-08-14 DIAGNOSIS — E039 Hypothyroidism, unspecified: Secondary | ICD-10-CM | POA: Diagnosis not present

## 2021-08-15 DIAGNOSIS — E038 Other specified hypothyroidism: Secondary | ICD-10-CM | POA: Diagnosis not present

## 2021-08-15 DIAGNOSIS — E559 Vitamin D deficiency, unspecified: Secondary | ICD-10-CM | POA: Diagnosis not present

## 2021-08-16 DIAGNOSIS — M47812 Spondylosis without myelopathy or radiculopathy, cervical region: Secondary | ICD-10-CM | POA: Diagnosis not present

## 2021-08-16 DIAGNOSIS — G609 Hereditary and idiopathic neuropathy, unspecified: Secondary | ICD-10-CM | POA: Diagnosis not present

## 2021-08-16 DIAGNOSIS — M069 Rheumatoid arthritis, unspecified: Secondary | ICD-10-CM | POA: Diagnosis not present

## 2021-08-16 DIAGNOSIS — S0240CD Maxillary fracture, right side, subsequent encounter for fracture with routine healing: Secondary | ICD-10-CM | POA: Diagnosis not present

## 2021-08-16 DIAGNOSIS — S32401D Unspecified fracture of right acetabulum, subsequent encounter for fracture with routine healing: Secondary | ICD-10-CM | POA: Diagnosis not present

## 2021-08-16 DIAGNOSIS — S32591D Other specified fracture of right pubis, subsequent encounter for fracture with routine healing: Secondary | ICD-10-CM | POA: Diagnosis not present

## 2021-08-17 DIAGNOSIS — S32401D Unspecified fracture of right acetabulum, subsequent encounter for fracture with routine healing: Secondary | ICD-10-CM | POA: Diagnosis not present

## 2021-08-17 DIAGNOSIS — M069 Rheumatoid arthritis, unspecified: Secondary | ICD-10-CM | POA: Diagnosis not present

## 2021-08-17 DIAGNOSIS — M47812 Spondylosis without myelopathy or radiculopathy, cervical region: Secondary | ICD-10-CM | POA: Diagnosis not present

## 2021-08-17 DIAGNOSIS — S32591D Other specified fracture of right pubis, subsequent encounter for fracture with routine healing: Secondary | ICD-10-CM | POA: Diagnosis not present

## 2021-08-17 DIAGNOSIS — G609 Hereditary and idiopathic neuropathy, unspecified: Secondary | ICD-10-CM | POA: Diagnosis not present

## 2021-08-17 DIAGNOSIS — S0240CD Maxillary fracture, right side, subsequent encounter for fracture with routine healing: Secondary | ICD-10-CM | POA: Diagnosis not present

## 2021-09-14 DIAGNOSIS — S32591A Other specified fracture of right pubis, initial encounter for closed fracture: Secondary | ICD-10-CM | POA: Diagnosis not present

## 2021-10-10 DIAGNOSIS — H353131 Nonexudative age-related macular degeneration, bilateral, early dry stage: Secondary | ICD-10-CM | POA: Diagnosis not present

## 2021-10-12 ENCOUNTER — Ambulatory Visit: Payer: Medicare Other | Admitting: Nurse Practitioner

## 2021-10-26 DIAGNOSIS — M79642 Pain in left hand: Secondary | ICD-10-CM | POA: Diagnosis not present

## 2021-10-26 DIAGNOSIS — Z6822 Body mass index (BMI) 22.0-22.9, adult: Secondary | ICD-10-CM | POA: Diagnosis not present

## 2021-10-26 DIAGNOSIS — M79641 Pain in right hand: Secondary | ICD-10-CM | POA: Diagnosis not present

## 2021-10-26 DIAGNOSIS — M0609 Rheumatoid arthritis without rheumatoid factor, multiple sites: Secondary | ICD-10-CM | POA: Diagnosis not present

## 2021-11-15 DIAGNOSIS — M0609 Rheumatoid arthritis without rheumatoid factor, multiple sites: Secondary | ICD-10-CM | POA: Diagnosis not present

## 2021-11-16 ENCOUNTER — Ambulatory Visit: Payer: Self-pay | Admitting: Nurse Practitioner

## 2022-01-01 ENCOUNTER — Ambulatory Visit (INDEPENDENT_AMBULATORY_CARE_PROVIDER_SITE_OTHER): Payer: Medicare Other | Admitting: Nurse Practitioner

## 2022-01-01 ENCOUNTER — Encounter: Payer: Self-pay | Admitting: Nurse Practitioner

## 2022-01-01 ENCOUNTER — Other Ambulatory Visit: Payer: Self-pay

## 2022-01-01 VITALS — BP 132/74 | HR 93 | Temp 98.6°F | Resp 16 | Wt 134.9 lb

## 2022-01-01 DIAGNOSIS — Z1231 Encounter for screening mammogram for malignant neoplasm of breast: Secondary | ICD-10-CM | POA: Diagnosis not present

## 2022-01-01 DIAGNOSIS — E039 Hypothyroidism, unspecified: Secondary | ICD-10-CM

## 2022-01-01 DIAGNOSIS — Z85038 Personal history of other malignant neoplasm of large intestine: Secondary | ICD-10-CM | POA: Diagnosis not present

## 2022-01-01 DIAGNOSIS — N182 Chronic kidney disease, stage 2 (mild): Secondary | ICD-10-CM | POA: Diagnosis not present

## 2022-01-01 DIAGNOSIS — Z7689 Persons encountering health services in other specified circumstances: Secondary | ICD-10-CM | POA: Diagnosis not present

## 2022-01-01 DIAGNOSIS — J069 Acute upper respiratory infection, unspecified: Secondary | ICD-10-CM

## 2022-01-01 DIAGNOSIS — D649 Anemia, unspecified: Secondary | ICD-10-CM | POA: Diagnosis not present

## 2022-01-01 DIAGNOSIS — Z853 Personal history of malignant neoplasm of breast: Secondary | ICD-10-CM | POA: Diagnosis not present

## 2022-01-01 DIAGNOSIS — M069 Rheumatoid arthritis, unspecified: Secondary | ICD-10-CM | POA: Diagnosis not present

## 2022-01-01 DIAGNOSIS — E559 Vitamin D deficiency, unspecified: Secondary | ICD-10-CM

## 2022-01-01 DIAGNOSIS — M25472 Effusion, left ankle: Secondary | ICD-10-CM

## 2022-01-01 DIAGNOSIS — I10 Essential (primary) hypertension: Secondary | ICD-10-CM | POA: Diagnosis not present

## 2022-01-01 DIAGNOSIS — E7800 Pure hypercholesterolemia, unspecified: Secondary | ICD-10-CM | POA: Diagnosis not present

## 2022-01-01 LAB — POCT INFLUENZA A/B
Influenza A, POC: NEGATIVE
Influenza B, POC: NEGATIVE

## 2022-01-01 MED ORDER — FLUTICASONE PROPIONATE 50 MCG/ACT NA SUSP: 2.0000 | Freq: Every day | NASAL | 1 refills | Status: AC

## 2022-01-01 MED ORDER — CETIRIZINE HCL 10 MG PO TABS: 10.0000 mg | ORAL_TABLET | Freq: Every day | ORAL | 1 refills | Status: DC

## 2022-01-01 NOTE — Assessment & Plan Note (Signed)
Continue to follow-up with rheumatology.  Continue methotrexate 2.5 mg

## 2022-01-01 NOTE — Assessment & Plan Note (Signed)
Patient reports that she did have breast cancer in her left breast.  She says she had a mastectomy.  Patient states this was 30 years ago.  Patient has not had issues since.  Patient states she is due for mammogram.

## 2022-01-01 NOTE — Assessment & Plan Note (Signed)
Continue taking lisinopril 5 mg daily.

## 2022-01-01 NOTE — Assessment & Plan Note (Signed)
We will check vitamin D level today.  Continue taking vitamin D supplementation.

## 2022-01-01 NOTE — Assessment & Plan Note (Signed)
Getting labs today.  Patient is currently taking levothyroxine 75 mcg daily.

## 2022-01-01 NOTE — Assessment & Plan Note (Signed)
Patient reports that she did have to have chemo after her colon cancer diagnosis.  She says this was a long time ago.  Patient has not had any other issues.

## 2022-01-01 NOTE — Progress Notes (Signed)
BP 132/74   Pulse 93   Temp 98.6 F (37 C) (Oral)   Resp 16   Wt 134 lb 14.4 oz (61.2 kg)   SpO2 96%   BMI 23.16 kg/m    Subjective:    Patient ID: Wanda Cohen, female    DOB: 19-Jun-1934, 87 y.o.   MRN: [id number]  HPI: Wanda Cohen is a 87 y.o. female  Chief Complaint  Patient presents with   Establish Care   Sinusitis    Congested, ears stopped up, eyes itchy heavy, covid test negative   Hypothyroidism   Hypertension   Establish care: Patient has a history of RA, hypertension, hypothyroidism.  Patient also reports she has a URI. Patient has history of breast cancer and due for mammogram.   Hypothyroidism:  She is currently taking levothyroxine 75 mcg daily.  She used to be on 88 mcg.  Patient denies any palpitations, weight loss or gain, constipation. We will get labs today.  Hypertension:  Her blood pressure today is 132/74.  She is currently taking lisinopril 5 mg daily. She denies any chest pain, shortness of breath, headaches or blurred vision.   Rheumatoid arthritis:  She is currently taking methotrexate 2.5 mg. She currently sees Leafy Kindle ([place]) in rheumatology.   History of Breast cancer/ colon cancer:  Mastectomy left side 30 years ago, 11 years later she had colon cancer she had to have chemo.  She has been doing well.    URI: patient reports symptoms started three days ago.  Symptoms include sore throat, ears stopped up, itchy eyes and facial pressure.  She says she did a home covid test and it was negative. She says she has been taking tylenol.    Left ankle edema: Patient reports she has had left ankle swelling for a while now.  Patient denies any injury.  Patient does have good pulses.  Recommend patient use compression socks and elevation.  Relevant past medical, surgical, family and social history reviewed and updated as indicated. Interim medical history since our last visit reviewed. Allergies and medications reviewed and  updated.  Review of Systems  Constitutional: Negative for fever or weight change.  HEENT: Positive for nasal congestion, ears feel clogged and facial pressure Respiratory: Negative for cough and shortness of breath.   Cardiovascular: Negative for chest pain or palpitations.  Gastrointestinal: Negative for abdominal pain, no bowel changes.  Musculoskeletal: Negative for gait problem or joint swelling.  Skin: Negative for rash.  Neurological: Negative for dizziness or headache.  No other specific complaints in a complete review of systems (except as listed in HPI above).      Objective:    BP 132/74   Pulse 93   Temp 98.6 F (37 C) (Oral)   Resp 16   Wt 134 lb 14.4 oz (61.2 kg)   SpO2 96%   BMI 23.16 kg/m   Wt Readings from Last 3 Encounters:  01/01/22 134 lb 14.4 oz (61.2 kg)  06/15/21 133 lb (60.3 kg)  07/09/17 138 lb (62.6 kg)    Physical Exam Constitutional: Patient appears well-developed and well-nourished.  No distress.  HEENT: head atraumatic, normocephalic, pupils equal and reactive to light, ears TMs clear, neck supple Cardiovascular: Normal rate, regular rhythm and normal heart sounds.  No murmur heard. Left ankle non pitting edema Pulmonary/Chest: Effort normal and breath sounds normal. No respiratory distress. Abdominal: Soft.  There is no tenderness. Psychiatric: Patient has a normal mood and affect. behavior is normal. Judgment and thought  content normal.   Results for orders placed or performed in visit on 01/01/22  POCT Influenza A/B  Result Value Ref Range   Influenza A, POC Negative Negative   Influenza B, POC Negative Negative      Assessment & Plan:   Problem List Items Addressed This Visit       Cardiovascular and Mediastinum   Benign essential hypertension    Continue taking lisinopril 5 mg daily.      Relevant Medications   lisinopril (ZESTRIL) 5 MG tablet   Other Relevant Orders   CBC with Differential/Platelet   COMPLETE METABOLIC  PANEL WITH GFR     Endocrine   Hypothyroidism - Primary (Chronic)    Getting labs today.  Patient is currently taking levothyroxine 75 mcg daily.      Relevant Medications   levothyroxine (SYNTHROID) 75 MCG tablet   Other Relevant Orders   TSH     Musculoskeletal and Integument   Rheumatoid arthritis ([place])    Continue to follow-up with rheumatology.  Continue methotrexate 2.5 mg        Other   Personal history of colon cancer    Patient reports that she did have to have chemo after her colon cancer diagnosis.  She says this was a long time ago.  Patient has not had any other issues.      Vitamin D deficiency    We will check vitamin D level today.  Continue taking vitamin D supplementation.      Relevant Orders   VITAMIN D 25 Hydroxy (Vit-D Deficiency, Fractures)   Pure hypercholesterolemia    Patient is not currently on cholesterol-lowering medication.  We will get labs today.      Relevant Medications   lisinopril (ZESTRIL) 5 MG tablet   Other Relevant Orders   Lipid panel   Personal history of breast cancer    Patient reports that she did have breast cancer in her left breast.  She says she had a mastectomy.  Patient states this was 30 years ago.  Patient has not had issues since.  Patient states she is due for mammogram.      Other Visit Diagnoses     Viral upper respiratory tract infection       Start using Zyrtec and Flonase.  Fluids and get rest.   Relevant Medications   fluticasone (FLONASE) 50 MCG/ACT nasal spray   cetirizine (ZYRTEC) 10 MG tablet   Other Relevant Orders   POCT Influenza A/B (Completed)   Left ankle swelling       Recommend using compression socks and elevation.   Encounter to establish care       Encounter for screening mammogram for malignant neoplasm of breast       Relevant Orders   MM 3D SCREEN BREAST UNI RIGHT        Follow up plan: Return in about 6 months (around 07/03/2022) for follow up.

## 2022-01-01 NOTE — Assessment & Plan Note (Signed)
Patient is not currently on cholesterol-lowering medication.  We will get labs today.

## 2022-01-02 ENCOUNTER — Other Ambulatory Visit: Payer: Self-pay | Admitting: Nurse Practitioner

## 2022-01-02 DIAGNOSIS — D649 Anemia, unspecified: Secondary | ICD-10-CM

## 2022-01-04 LAB — COMPLETE METABOLIC PANEL WITH GFR
AG Ratio: 1.4 (calc) (ref 1.0–2.5)
ALT: 8 U/L (ref 6–29)
AST: 16 U/L (ref 10–35)
Albumin: 4.2 g/dL (ref 3.6–5.1)
Alkaline phosphatase (APISO): 92 U/L (ref 37–153)
BUN/Creatinine Ratio: 15 (calc) (ref 6–22)
BUN: 15 mg/dL (ref 7–25)
CO2: 27 mmol/L (ref 20–32)
Calcium: 9.4 mg/dL (ref 8.6–10.4)
Chloride: 104 mmol/L (ref 98–110)
Creat: 1.02 mg/dL — ABNORMAL HIGH (ref 0.60–0.95)
Globulin: 3 g/dL (calc) (ref 1.9–3.7)
Glucose, Bld: 97 mg/dL (ref 65–139)
Potassium: 4.5 mmol/L (ref 3.5–5.3)
Sodium: 141 mmol/L (ref 135–146)
Total Bilirubin: 0.4 mg/dL (ref 0.2–1.2)
Total Protein: 7.2 g/dL (ref 6.1–8.1)
eGFR: 54 mL/min/{1.73_m2} — ABNORMAL LOW (ref >=60)

## 2022-01-04 LAB — TEST AUTHORIZATION

## 2022-01-04 LAB — CBC WITH DIFFERENTIAL/PLATELET
Absolute Monocytes: 1044 cells/uL — ABNORMAL HIGH (ref 200–950)
Basophils Absolute: 30 cells/uL (ref 0–200)
Basophils Relative: 0.5 %
Eosinophils Absolute: 89 cells/uL (ref 15–500)
Eosinophils Relative: 1.5 %
HCT: 33.1 % — ABNORMAL LOW (ref 35.0–45.0)
Hemoglobin: 11.1 g/dL — ABNORMAL LOW (ref 11.7–15.5)
Lymphs Abs: 956 cells/uL (ref 850–3900)
MCH: 31.3 pg (ref 27.0–33.0)
MCHC: 33.5 g/dL (ref 32.0–36.0)
MCV: 93.2 fL (ref 80.0–100.0)
MPV: 10.6 fL (ref 7.5–12.5)
Monocytes Relative: 17.7 %
Neutro Abs: 3782 cells/uL (ref 1500–7800)
Neutrophils Relative %: 64.1 %
Platelets: 228 10*3/uL (ref 140–400)
RBC: 3.55 10*6/uL — ABNORMAL LOW (ref 3.80–5.10)
RDW: 14.6 % (ref 11.0–15.0)
Total Lymphocyte: 16.2 %
WBC: 5.9 10*3/uL (ref 3.8–10.8)

## 2022-01-04 LAB — LIPID PANEL
Cholesterol: 183 mg/dL (ref <200)
HDL: 60 mg/dL (ref >=50)
LDL Cholesterol (Calc): 97 mg/dL (calc)
Non-HDL Cholesterol (Calc): 123 mg/dL (calc) (ref <130)
Total CHOL/HDL Ratio: 3.1 (calc) (ref <5.0)
Triglycerides: 154 mg/dL — ABNORMAL HIGH (ref <150)

## 2022-01-04 LAB — IRON,TIBC AND FERRITIN PANEL
%SAT: 20 % (calc) (ref 16–45)
Ferritin: 71 ng/mL (ref 16–288)
Iron: 65 ug/dL (ref 45–160)
TIBC: 324 mcg/dL (calc) (ref 250–450)

## 2022-01-04 LAB — TSH: TSH: 2.03 mIU/L (ref 0.40–4.50)

## 2022-01-04 LAB — VITAMIN D 25 HYDROXY (VIT D DEFICIENCY, FRACTURES): Vit D, 25-Hydroxy: 97 ng/mL (ref 30–100)

## 2022-01-10 DIAGNOSIS — Z111 Encounter for screening for respiratory tuberculosis: Secondary | ICD-10-CM | POA: Diagnosis not present

## 2022-01-10 DIAGNOSIS — R5382 Chronic fatigue, unspecified: Secondary | ICD-10-CM | POA: Diagnosis not present

## 2022-01-10 DIAGNOSIS — M0609 Rheumatoid arthritis without rheumatoid factor, multiple sites: Secondary | ICD-10-CM | POA: Diagnosis not present

## 2022-01-10 DIAGNOSIS — Z79899 Other long term (current) drug therapy: Secondary | ICD-10-CM | POA: Diagnosis not present

## 2022-01-23 ENCOUNTER — Encounter: Payer: Self-pay | Admitting: Nurse Practitioner

## 2022-01-24 ENCOUNTER — Other Ambulatory Visit: Payer: Self-pay | Admitting: Nurse Practitioner

## 2022-01-24 DIAGNOSIS — Z23 Encounter for immunization: Secondary | ICD-10-CM | POA: Diagnosis not present

## 2022-01-24 DIAGNOSIS — E559 Vitamin D deficiency, unspecified: Secondary | ICD-10-CM

## 2022-01-24 MED ORDER — VITAMIN D (ERGOCALCIFEROL) 1.25 MG (50000 UNIT) PO CAPS: 50000.0000 [IU] | ORAL_CAPSULE | ORAL | 3 refills | Status: DC

## 2022-01-28 ENCOUNTER — Encounter: Payer: Self-pay | Admitting: Nurse Practitioner

## 2022-01-29 ENCOUNTER — Other Ambulatory Visit: Payer: Self-pay | Admitting: Nurse Practitioner

## 2022-01-29 DIAGNOSIS — R5382 Chronic fatigue, unspecified: Secondary | ICD-10-CM | POA: Diagnosis not present

## 2022-01-29 DIAGNOSIS — Z6823 Body mass index (BMI) 23.0-23.9, adult: Secondary | ICD-10-CM | POA: Diagnosis not present

## 2022-01-29 DIAGNOSIS — Z79899 Other long term (current) drug therapy: Secondary | ICD-10-CM | POA: Diagnosis not present

## 2022-01-29 DIAGNOSIS — Z111 Encounter for screening for respiratory tuberculosis: Secondary | ICD-10-CM | POA: Diagnosis not present

## 2022-01-29 DIAGNOSIS — M79642 Pain in left hand: Secondary | ICD-10-CM | POA: Diagnosis not present

## 2022-01-29 DIAGNOSIS — M0609 Rheumatoid arthritis without rheumatoid factor, multiple sites: Secondary | ICD-10-CM | POA: Diagnosis not present

## 2022-01-29 DIAGNOSIS — E559 Vitamin D deficiency, unspecified: Secondary | ICD-10-CM

## 2022-01-29 DIAGNOSIS — M79641 Pain in right hand: Secondary | ICD-10-CM | POA: Diagnosis not present

## 2022-01-29 DIAGNOSIS — D649 Anemia, unspecified: Secondary | ICD-10-CM

## 2022-01-29 DIAGNOSIS — I10 Essential (primary) hypertension: Secondary | ICD-10-CM

## 2022-01-29 MED ORDER — LISINOPRIL 5 MG PO TABS: ORAL_TABLET | ORAL | 1 refills | Status: DC

## 2022-01-29 MED ORDER — VITAMIN D (ERGOCALCIFEROL) 1.25 MG (50000 UNIT) PO CAPS: 50000.0000 [IU] | ORAL_CAPSULE | ORAL | 3 refills | Status: DC

## 2022-01-29 MED ORDER — FOLIC ACID 1 MG PO TABS: 1.0000 mg | ORAL_TABLET | Freq: Every day | ORAL | 1 refills | Status: DC

## 2022-02-01 ENCOUNTER — Encounter: Payer: Self-pay | Admitting: Nurse Practitioner

## 2022-02-02 ENCOUNTER — Other Ambulatory Visit: Payer: Self-pay | Admitting: Nurse Practitioner

## 2022-02-02 DIAGNOSIS — E039 Hypothyroidism, unspecified: Secondary | ICD-10-CM

## 2022-02-02 MED ORDER — LEVOTHYROXINE SODIUM 75 MCG PO TABS: 75.0000 ug | ORAL_TABLET | Freq: Every day | ORAL | 1 refills | Status: DC

## 2022-03-07 DIAGNOSIS — M0609 Rheumatoid arthritis without rheumatoid factor, multiple sites: Secondary | ICD-10-CM | POA: Diagnosis not present

## 2022-03-12 ENCOUNTER — Ambulatory Visit (INDEPENDENT_AMBULATORY_CARE_PROVIDER_SITE_OTHER): Payer: Medicare Other | Admitting: Podiatry

## 2022-03-12 DIAGNOSIS — L603 Nail dystrophy: Secondary | ICD-10-CM | POA: Diagnosis not present

## 2022-03-12 DIAGNOSIS — B351 Tinea unguium: Secondary | ICD-10-CM | POA: Diagnosis not present

## 2022-03-12 DIAGNOSIS — L601 Onycholysis: Secondary | ICD-10-CM | POA: Diagnosis not present

## 2022-03-12 NOTE — Patient Instructions (Signed)

## 2022-03-13 NOTE — Progress Notes (Signed)
  Subjective:  Patient ID: Wanda Cohen, female    DOB: [birth year],  MRN: [id number]  Chief Complaint  Patient presents with   Nail Problem    Examine big toe nail for fungal infection or injury. Nail is discolored and skin is inflamed.    87 y.o. female presents with the above complaint. History confirmed with patient.  She is not sure how this started going on for couple of months now it has been darkening and discoloring and continue to be very painful  Objective:  Physical Exam: warm, good capillary refill, no trophic changes or ulcerative lesions, normal DP and PT pulses, normal sensory exam, and dystrophic left hallux nail with bruising under nail plate brown and yellow discoloration, there is onycholysis noted.  Assessment:   1. Onycholysis      Plan:  Patient was evaluated and treated and all questions answered.  I discussed with her she has onycholysis likely secondary to occult trauma.  I discussed treatment options of this including allowing the nail to grow it appears that the new nail plate is trying to grow in but is unable to.  I recommended avulsion of the nail plate today.  Discussed the nail may not regrow.  Following sterile prep with Betadine and local digital block with 1.5 cc each of 2% lidocaine and 0.5% Marcaine plain the left hallux nail plate was avulsed and no phenol matricectomy was applied today.  Post care instructions given.  Nail plate was sent for nail culture.  I will see her back on an as-needed basis.  Return if symptoms worsen or fail to improve.

## 2022-03-15 ENCOUNTER — Other Ambulatory Visit: Payer: Self-pay

## 2022-03-15 DIAGNOSIS — L601 Onycholysis: Secondary | ICD-10-CM

## 2022-03-21 ENCOUNTER — Encounter: Payer: Self-pay | Admitting: Podiatry

## 2022-03-27 MED ORDER — TERBINAFINE HCL 250 MG PO TABS: 250.0000 mg | ORAL_TABLET | Freq: Every day | ORAL | 0 refills | Status: AC

## 2022-05-02 DIAGNOSIS — M0609 Rheumatoid arthritis without rheumatoid factor, multiple sites: Secondary | ICD-10-CM | POA: Diagnosis not present

## 2022-06-27 DIAGNOSIS — M0609 Rheumatoid arthritis without rheumatoid factor, multiple sites: Secondary | ICD-10-CM | POA: Diagnosis not present

## 2022-06-27 DIAGNOSIS — Z79899 Other long term (current) drug therapy: Secondary | ICD-10-CM | POA: Diagnosis not present

## 2022-07-03 NOTE — Progress Notes (Unsigned)
There were no vitals taken for this visit.   Subjective:    Patient ID: Wanda Cohen, female    DOB: 01-13-1935, 88 y.o.   MRN: [id number]  HPI: Wanda Cohen is a 88 y.o. female  No chief complaint on file.   Hypothyroidism:  She is currently taking levothyroxine 75 mcg daily.   Patient denies any palpitations, weight loss or gain, constipation.  Last TSH was 2.03 on 01/01/2022.  Hypertension:   She is currently taking lisinopril 5 mg daily. She denies any chest pain, shortness of breath, headaches or blurred vision.  Her blood pressure today is ***.  Rheumatoid arthritis:  She is currently taking methotrexate 2.5 mg. She currently sees Leafy Kindle ([place]) in rheumatology. Patient reports ***.   History of Breast cancer/ colon cancer:  Mastectomy left side 30 years ago, 11 years later she had colon cancer she had to have chemo. Patient reports ***   Relevant past medical, surgical, family and social history reviewed and updated as indicated. Interim medical history since our last visit reviewed. Allergies and medications reviewed and updated.  Review of Systems  Constitutional: Negative for fever or weight change.  HEENT: Positive for nasal congestion, ears feel clogged and facial pressure Respiratory: Negative for cough and shortness of breath.   Cardiovascular: Negative for chest pain or palpitations.  Gastrointestinal: Negative for abdominal pain, no bowel changes.  Musculoskeletal: Negative for gait problem or joint swelling.  Skin: Negative for rash.  Neurological: Negative for dizziness or headache.  No other specific complaints in a complete review of systems (except as listed in HPI above).      Objective:    There were no vitals taken for this visit.  Wt Readings from Last 3 Encounters:  01/01/22 134 lb 14.4 oz (61.2 kg)  06/15/21 133 lb (60.3 kg)  07/09/17 138 lb (62.6 kg)    Physical Exam Constitutional: Patient appears well-developed and  well-nourished.  No distress.  HEENT: head atraumatic, normocephalic, pupils equal and reactive to light, ears TMs clear, neck supple Cardiovascular: Normal rate, regular rhythm and normal heart sounds.  No murmur heard. Left ankle non pitting edema Pulmonary/Chest: Effort normal and breath sounds normal. No respiratory distress. Abdominal: Soft.  There is no tenderness. Psychiatric: Patient has a normal mood and affect. behavior is normal. Judgment and thought content normal.   Results for orders placed or performed in visit on 01/01/22  CBC with Differential/Platelet  Result Value Ref Range   WBC 5.9 3.8 - 10.8 Thousand/uL   RBC 3.55 (L) 3.80 - 5.10 Million/uL   Hemoglobin 11.1 (L) 11.7 - 15.5 g/dL   HCT 33.1 (L) 35.0 - 45.0 %   MCV 93.2 80.0 - 100.0 fL   MCH 31.3 27.0 - 33.0 pg   MCHC 33.5 32.0 - 36.0 g/dL   RDW 14.6 11.0 - 15.0 %   Platelets 228 140 - 400 Thousand/uL   MPV 10.6 7.5 - 12.5 fL   Neutro Abs 3,782 1,500 - 7,800 cells/uL   Lymphs Abs 956 850 - 3,900 cells/uL   Absolute Monocytes 1,044 (H) 200 - 950 cells/uL   Eosinophils Absolute 89 15 - 500 cells/uL   Basophils Absolute 30 0 - 200 cells/uL   Neutrophils Relative % 64.1 %   Total Lymphocyte 16.2 %   Monocytes Relative 17.7 %   Eosinophils Relative 1.5 %   Basophils Relative 0.5 %  COMPLETE METABOLIC PANEL WITH GFR  Result Value Ref Range   Glucose, Bld 97 65 -  139 mg/dL   BUN 15 7 - 25 mg/dL   Creat 1.02 (H) 0.60 - 0.95 mg/dL   eGFR 54 (L) > OR = 60 mL/min/1.54m2   BUN/Creatinine Ratio 15 6 - 22 (calc)   Sodium 141 135 - 146 mmol/L   Potassium 4.5 3.5 - 5.3 mmol/L   Chloride 104 98 - 110 mmol/L   CO2 27 20 - 32 mmol/L   Calcium 9.4 8.6 - 10.4 mg/dL   Total Protein 7.2 6.1 - 8.1 g/dL   Albumin 4.2 3.6 - 5.1 g/dL   Globulin 3.0 1.9 - 3.7 g/dL (calc)   AG Ratio 1.4 1.0 - 2.5 (calc)   Total Bilirubin 0.4 0.2 - 1.2 mg/dL   Alkaline phosphatase (APISO) 92 37 - 153 U/L   AST 16 10 - 35 U/L   ALT 8 6 - 29 U/L   TSH  Result Value Ref Range   TSH 2.03 0.40 - 4.50 mIU/L  VITAMIN D 25 Hydroxy (Vit-D Deficiency, Fractures)  Result Value Ref Range   Vit D, 25-Hydroxy 97 30 - 100 ng/mL  Lipid panel  Result Value Ref Range   Cholesterol 183 <200 mg/dL   HDL 60 > OR = 50 mg/dL   Triglycerides 154 (H) <150 mg/dL   LDL Cholesterol (Calc) 97 mg/dL (calc)   Total CHOL/HDL Ratio 3.1 <5.0 (calc)   Non-HDL Cholesterol (Calc) 123 <130 mg/dL (calc)  Iron, TIBC and Ferritin Panel  Result Value Ref Range   Iron 65 45 - 160 mcg/dL   TIBC 324 250 - 450 mcg/dL (calc)   %SAT 20 16 - 45 % (calc)   Ferritin 71 16 - 288 ng/mL  TEST AUTHORIZATION  Result Value Ref Range   TEST NAME: IRON, TIBC AND FERRITIN PANEL    TEST CODE: [id number]    CLIENT CONTACT: TALETA TATUM    REPORT ALWAYS MESSAGE SIGNATURE    POCT Influenza A/B  Result Value Ref Range   Influenza A, POC Negative Negative   Influenza B, POC Negative Negative      Assessment & Plan:   Problem List Items Addressed This Visit   None    Follow up plan: No follow-ups on file.

## 2022-07-04 ENCOUNTER — Other Ambulatory Visit: Payer: Self-pay

## 2022-07-04 ENCOUNTER — Encounter: Payer: Self-pay | Admitting: Nurse Practitioner

## 2022-07-04 ENCOUNTER — Ambulatory Visit (INDEPENDENT_AMBULATORY_CARE_PROVIDER_SITE_OTHER): Payer: Medicare Other | Admitting: Nurse Practitioner

## 2022-07-04 VITALS — BP 124/76 | HR 95 | Temp 97.6°F | Resp 15 | Wt 142.3 lb

## 2022-07-04 DIAGNOSIS — M069 Rheumatoid arthritis, unspecified: Secondary | ICD-10-CM | POA: Diagnosis not present

## 2022-07-04 DIAGNOSIS — E7800 Pure hypercholesterolemia, unspecified: Secondary | ICD-10-CM

## 2022-07-04 DIAGNOSIS — H93231 Hyperacusis, right ear: Secondary | ICD-10-CM | POA: Diagnosis not present

## 2022-07-04 DIAGNOSIS — Z85038 Personal history of other malignant neoplasm of large intestine: Secondary | ICD-10-CM | POA: Diagnosis not present

## 2022-07-04 DIAGNOSIS — E559 Vitamin D deficiency, unspecified: Secondary | ICD-10-CM | POA: Diagnosis not present

## 2022-07-04 DIAGNOSIS — D649 Anemia, unspecified: Secondary | ICD-10-CM | POA: Diagnosis not present

## 2022-07-04 DIAGNOSIS — Z131 Encounter for screening for diabetes mellitus: Secondary | ICD-10-CM

## 2022-07-04 DIAGNOSIS — E039 Hypothyroidism, unspecified: Secondary | ICD-10-CM | POA: Diagnosis not present

## 2022-07-04 DIAGNOSIS — Z853 Personal history of malignant neoplasm of breast: Secondary | ICD-10-CM

## 2022-07-04 DIAGNOSIS — Z780 Asymptomatic menopausal state: Secondary | ICD-10-CM

## 2022-07-04 DIAGNOSIS — I10 Essential (primary) hypertension: Secondary | ICD-10-CM | POA: Diagnosis not present

## 2022-07-04 MED ORDER — LISINOPRIL 5 MG PO TABS
ORAL_TABLET | ORAL | 1 refills | Status: AC
Start: 2022-07-04 — End: ?

## 2022-07-04 MED ORDER — FOLIC ACID 1 MG PO TABS: 1.0000 mg | ORAL_TABLET | Freq: Every day | ORAL | 1 refills | Status: DC

## 2022-07-04 MED ORDER — VITAMIN D (ERGOCALCIFEROL) 1.25 MG (50000 UNIT) PO CAPS
50000.0000 [IU] | ORAL_CAPSULE | ORAL | 3 refills | Status: DC
Start: 2022-07-04 — End: 2023-08-07

## 2022-07-04 NOTE — Assessment & Plan Note (Signed)
Sees rheumatologist tomorrow.  Currently taking methotrexate 2.5 mg daily.

## 2022-07-04 NOTE — Assessment & Plan Note (Signed)
Continue working on lifestyle modification.  

## 2022-07-04 NOTE — Assessment & Plan Note (Signed)
Discussed with patient that according to her previous colonoscopy it is not recommended that she continue with colonoscopy screenings.  Patient verbalized understanding.

## 2022-07-04 NOTE — Assessment & Plan Note (Signed)
Getting labs today.  Currently taking levothyroxine 75 g daily.

## 2022-07-04 NOTE — Assessment & Plan Note (Signed)
Today 124 and 76.  Continue taking lisinopril 5 mg daily.

## 2022-07-04 NOTE — Assessment & Plan Note (Signed)
She continues to get mammograms annually.  She is going to call and schedule her appointment.

## 2022-07-04 NOTE — Assessment & Plan Note (Signed)
Continue vitamin D supplementation refill sent.  Will get labs today.

## 2022-07-05 ENCOUNTER — Other Ambulatory Visit: Payer: Self-pay | Admitting: Nurse Practitioner

## 2022-07-05 DIAGNOSIS — R5382 Chronic fatigue, unspecified: Secondary | ICD-10-CM | POA: Diagnosis not present

## 2022-07-05 DIAGNOSIS — M79642 Pain in left hand: Secondary | ICD-10-CM | POA: Diagnosis not present

## 2022-07-05 DIAGNOSIS — N1832 Chronic kidney disease, stage 3b: Secondary | ICD-10-CM | POA: Diagnosis not present

## 2022-07-05 DIAGNOSIS — Z6824 Body mass index (BMI) 24.0-24.9, adult: Secondary | ICD-10-CM | POA: Diagnosis not present

## 2022-07-05 DIAGNOSIS — M0609 Rheumatoid arthritis without rheumatoid factor, multiple sites: Secondary | ICD-10-CM | POA: Diagnosis not present

## 2022-07-05 DIAGNOSIS — Z79899 Other long term (current) drug therapy: Secondary | ICD-10-CM | POA: Diagnosis not present

## 2022-07-05 DIAGNOSIS — Z111 Encounter for screening for respiratory tuberculosis: Secondary | ICD-10-CM | POA: Diagnosis not present

## 2022-07-05 DIAGNOSIS — E039 Hypothyroidism, unspecified: Secondary | ICD-10-CM

## 2022-07-05 DIAGNOSIS — M79641 Pain in right hand: Secondary | ICD-10-CM | POA: Diagnosis not present

## 2022-07-05 LAB — TSH: TSH: 1.92 mIU/L (ref 0.40–4.50)

## 2022-07-05 LAB — VITAMIN D 25 HYDROXY (VIT D DEFICIENCY, FRACTURES): Vit D, 25-Hydroxy: 86 ng/mL (ref 30–100)

## 2022-07-05 LAB — IRON,TIBC AND FERRITIN PANEL
%SAT: 23 % (calc) (ref 16–45)
Ferritin: 79 ng/mL (ref 16–288)
Iron: 69 ug/dL (ref 45–160)
TIBC: 296 mcg/dL (calc) (ref 250–450)

## 2022-07-05 MED ORDER — LEVOTHYROXINE SODIUM 75 MCG PO TABS
75.0000 ug | ORAL_TABLET | Freq: Every day | ORAL | 1 refills | Status: AC
Start: 2022-07-05 — End: ?

## 2022-07-17 ENCOUNTER — Inpatient Hospital Stay
Admission: RE | Admit: 2022-07-17 | Discharge: 2022-07-17 | Disposition: A | Discharge status: Home / Self Care | Payer: Self-pay | Source: Ambulatory Visit | Attending: Nurse Practitioner | Admitting: Nurse Practitioner

## 2022-07-17 ENCOUNTER — Other Ambulatory Visit: Payer: Self-pay | Admitting: *Deleted

## 2022-07-17 DIAGNOSIS — Z1231 Encounter for screening mammogram for malignant neoplasm of breast: Secondary | ICD-10-CM

## 2022-08-22 DIAGNOSIS — Z79899 Other long term (current) drug therapy: Secondary | ICD-10-CM | POA: Diagnosis not present

## 2022-08-22 DIAGNOSIS — M0609 Rheumatoid arthritis without rheumatoid factor, multiple sites: Secondary | ICD-10-CM | POA: Diagnosis not present

## 2022-08-22 DIAGNOSIS — R5383 Other fatigue: Secondary | ICD-10-CM | POA: Diagnosis not present

## 2022-09-10 ENCOUNTER — Ambulatory Visit (INDEPENDENT_AMBULATORY_CARE_PROVIDER_SITE_OTHER): Payer: Medicare Other | Admitting: Podiatry

## 2022-09-10 DIAGNOSIS — L603 Nail dystrophy: Secondary | ICD-10-CM | POA: Diagnosis not present

## 2022-09-12 NOTE — Progress Notes (Signed)
  Subjective:  Patient ID: Wanda Cohen, female    DOB: 03-08-1935,  MRN: [id number]  Chief Complaint  Patient presents with   Nail Problem    Redness on tip of big toe. Nail was removed previously. Intermittent throbbing pain.    88 y.o. female presents with the above complaint. History confirmed with patient.    Objective:  Physical Exam: warm, good capillary refill, no trophic changes or ulcerative lesions, normal DP and PT pulses, normal sensory exam, and left hallux nail appears to be growing back well without onychomycosis, there is some thickening of the distal nail fold there are no signs of infection  Assessment:   1. Nail dystrophy      Plan:  Patient was evaluated and treated and all questions answered.  Appears to be healing well there are no signs of infection, advised to continue soaking and utilizing lotion as needed to soften the nail fold as it continues to grow back.  Return as needed  Return if symptoms worsen or fail to improve.

## 2022-09-25 ENCOUNTER — Ambulatory Visit
Admission: RE | Admit: 2022-09-25 | Discharge: 2022-09-25 | Disposition: A | Discharge status: Home / Self Care | Payer: Medicare Other | Source: Ambulatory Visit | Attending: Nurse Practitioner | Admitting: Nurse Practitioner

## 2022-09-25 DIAGNOSIS — M810 Age-related osteoporosis without current pathological fracture: Secondary | ICD-10-CM | POA: Diagnosis not present

## 2022-09-25 DIAGNOSIS — Z1231 Encounter for screening mammogram for malignant neoplasm of breast: Secondary | ICD-10-CM | POA: Insufficient documentation

## 2022-09-25 DIAGNOSIS — Z780 Asymptomatic menopausal state: Secondary | ICD-10-CM | POA: Insufficient documentation

## 2022-09-30 IMAGING — CT CT PELVIS W/O CM
2 of 5 series · 15 of 46 positions shown, 17 images · non-contrast
Comparison: 06/15/2021

CLINICAL DATA: Further evaluation for right pelvic fractures.

EXAM:
CT PELVIS WITHOUT CONTRAST
THREE DIMENSIONAL CT IMAGE RENDERING ON ACQUISITION WORKSTATION
TECHNIQUE: Multidetector CT imaging of the pelvis was performed following the
standard protocol without intravenous contrast.
RADIATION DOSE REDUCTION: This exam was performed according to the
departmental dose-optimization program which includes automated
exposure control, adjustment of the mA and/or kV according to
patient size and/or use of iterative reconstruction technique.

[Series 6: 3 axial soft · axial · 0.83mm/px · z∈[+731,+989]mm · 12 of 149 slices shown, 14 images]
[im 10/149  soft-tissue]
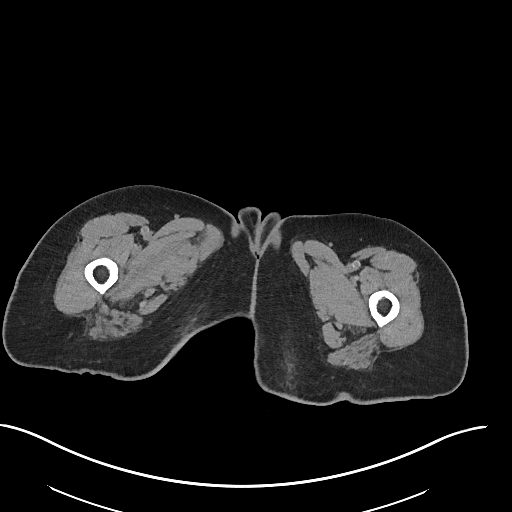
[im 10/149  bone]
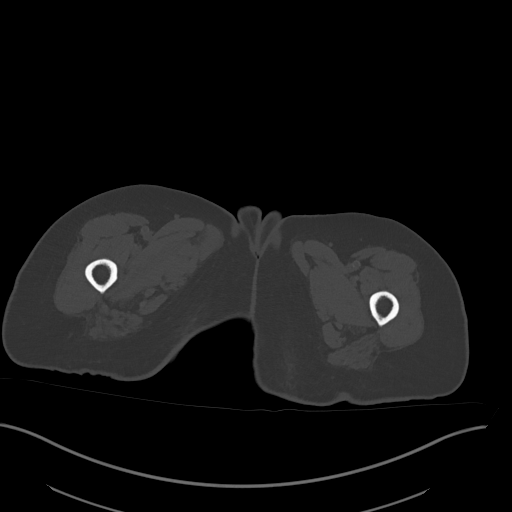
[im 20/149  soft-tissue]
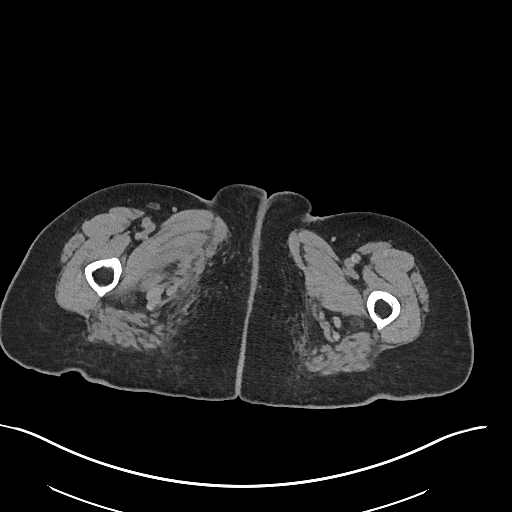
[im 34/149  soft-tissue]
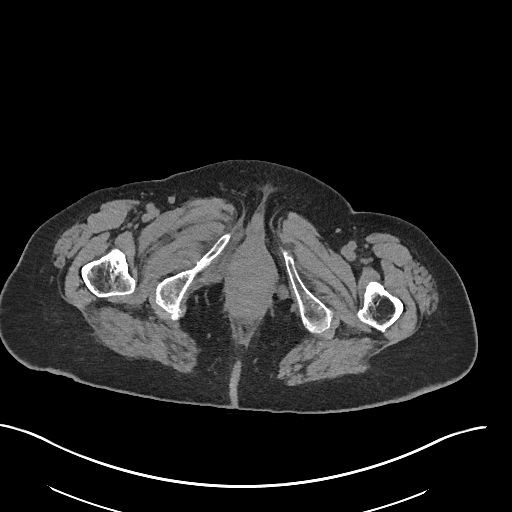
[im 43/149  soft-tissue]
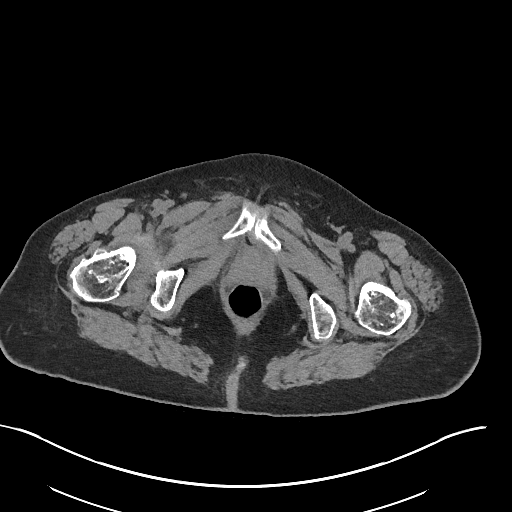
[im 58/149  soft-tissue]
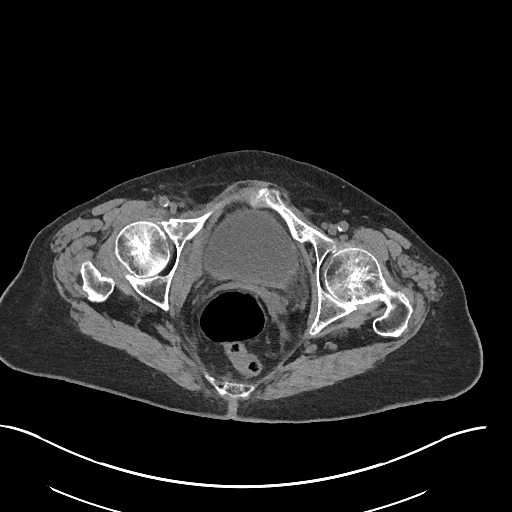
[im 67/149  soft-tissue]
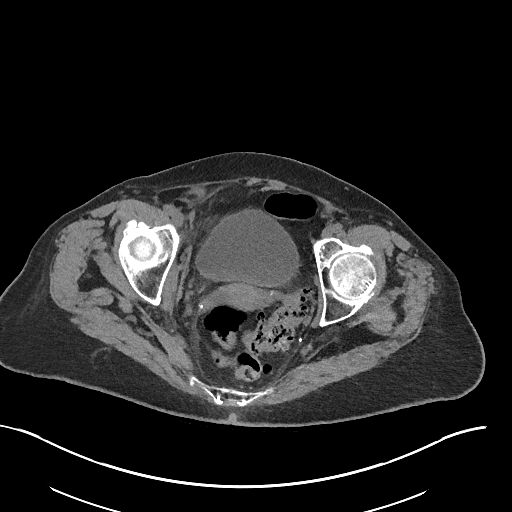
[im 82/149  soft-tissue]
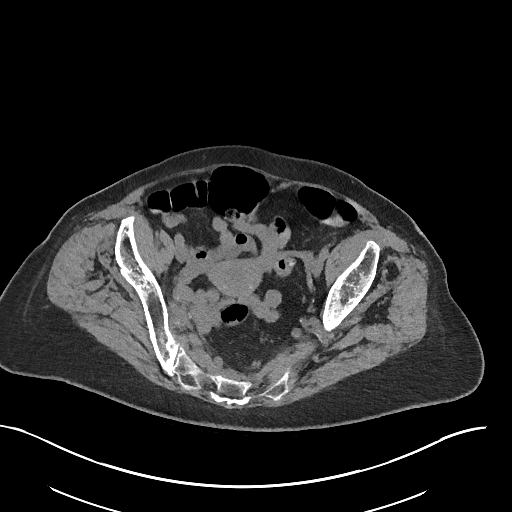
[im 91/149  soft-tissue]
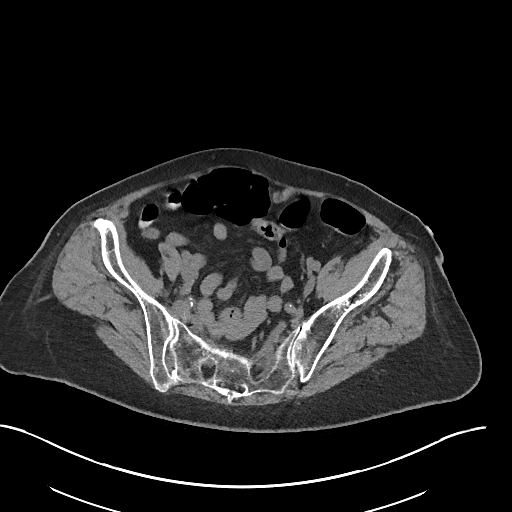
[im 106/149  soft-tissue]
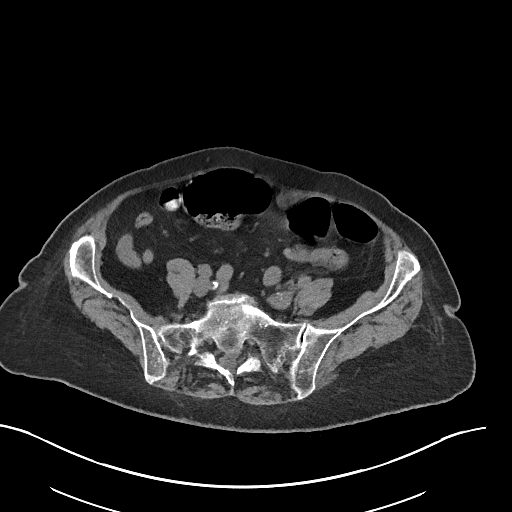
[im 106/149  bone]
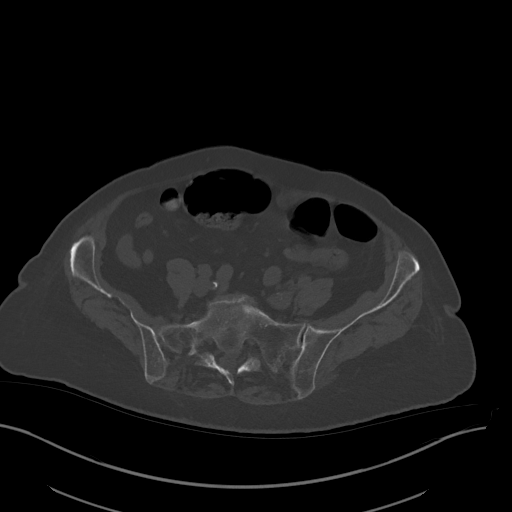
[im 115/149  soft-tissue]
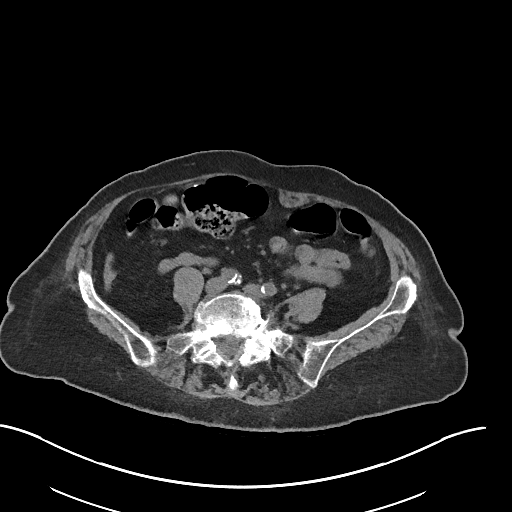
[im 129/149  soft-tissue]
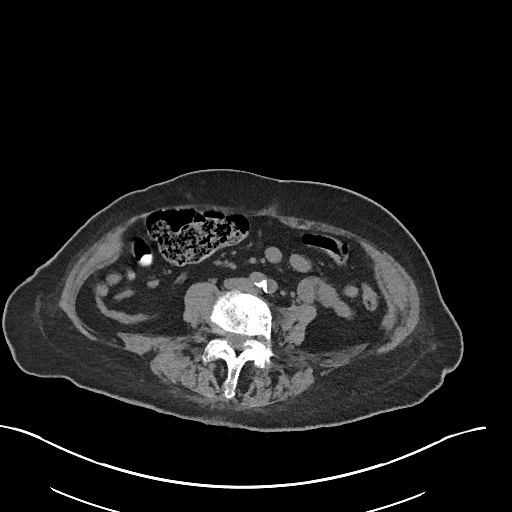
[im 139/149  soft-tissue]
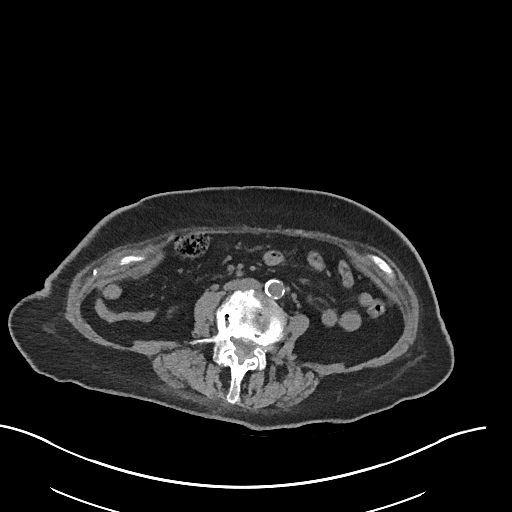

[Series 9: coronal st · coronal · 0.54mm/px · 3 of 122 slices shown]
[im 31/122  soft-tissue]
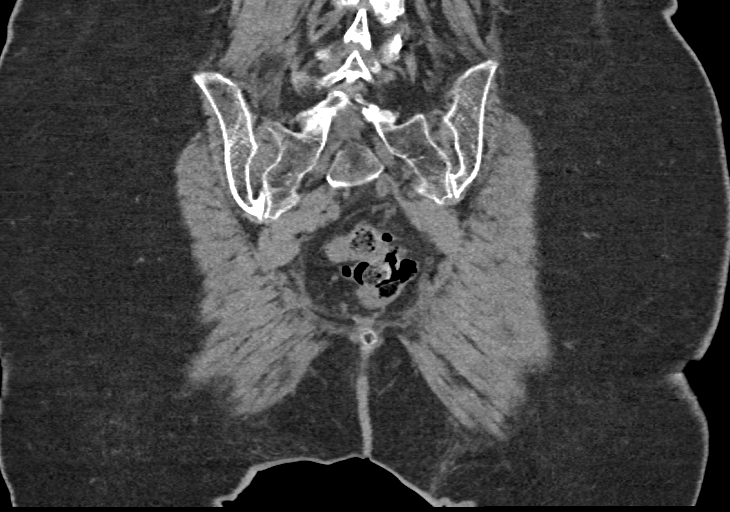
[im 61/122  soft-tissue]
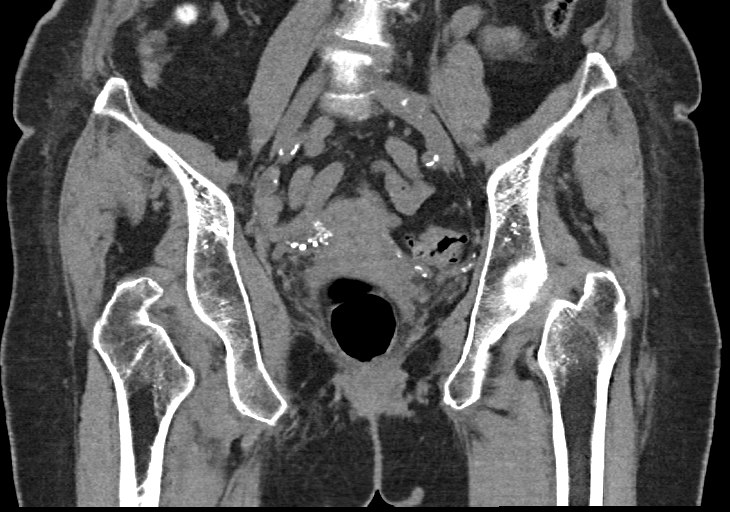
[im 91/122  soft-tissue]
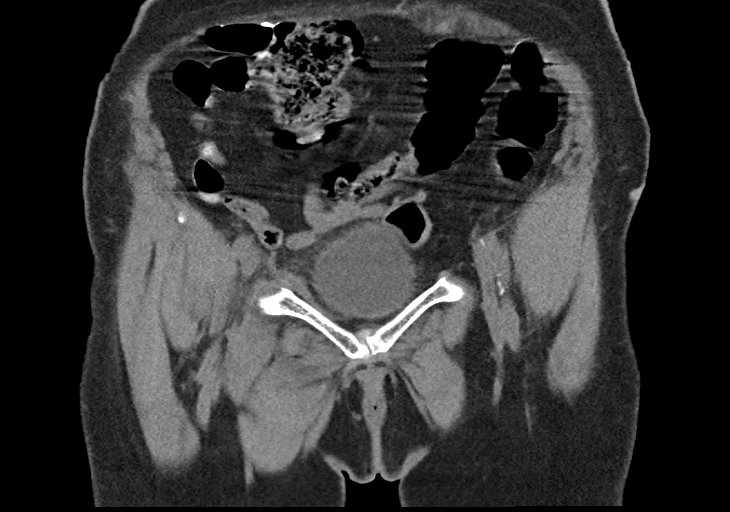

[15 of 46 positions shown; findings below may reference images not displayed]

FINDINGS: Musculoskeletal: Nondisplaced, non comminuted fracture at the
junction of the right pubis and anteromedial acetabulum, as noted on
current radiographs.

Acute nondisplaced, non comminuted fracture of the ischium along the
inferior margin of the inferior pubic ramus.

No other fractures.  No bone lesions.

Mild bilateral hip joint degenerative changes with small marginal
osteophytes from the bases of the femoral heads.

SI joints and pubic symphysis are normally aligned. Degenerative
changes are noted lumbar spine. No bone lesions.

Urinary Tract: 1 mm stone lies along the right posterior bladder
near the right ureterovesicular junction. Visualized ureters are
unremarkable.

Bowel: No bowel dilation. No bowel wall thickening or inflammation.
Multiple sigmoid colon diverticula.

Vascular/Lymphatic: Aortoiliac atherosclerotic calcifications. No
aneurysm. No enlarged lymph nodes.

Reproductive:  Postmenopausal uterus and adnexa are unremarkable.

Other:  None.
IMPRESSION: 1. Nondisplaced, non comminuted fracture of the right pubic bone at
its junction with the anterior right acetabulum. Nondisplaced, non
comminuted fracture of the right ischium along the inferior margin
of the inferior pubic ramus.
2. No other fractures.  No bone lesions.
3. 1 mm stone in the right posterior bladder near the right
ureterovesicular junction. Consider a recently passed right ureteral
stone if there has been recent consistent symptomology.

## 2022-10-04 ENCOUNTER — Encounter: Payer: Self-pay | Admitting: Nurse Practitioner

## 2022-10-05 ENCOUNTER — Other Ambulatory Visit: Payer: Self-pay | Admitting: Nurse Practitioner

## 2022-10-05 DIAGNOSIS — M810 Age-related osteoporosis without current pathological fracture: Secondary | ICD-10-CM

## 2022-10-15 ENCOUNTER — Other Ambulatory Visit: Payer: Self-pay

## 2022-10-15 DIAGNOSIS — M810 Age-related osteoporosis without current pathological fracture: Secondary | ICD-10-CM

## 2022-10-15 NOTE — Progress Notes (Signed)
Patient requested lab orders be changed to Wanda Cohen approved.

## 2022-10-16 DIAGNOSIS — H353131 Nonexudative age-related macular degeneration, bilateral, early dry stage: Secondary | ICD-10-CM | POA: Diagnosis not present

## 2022-10-24 ENCOUNTER — Other Ambulatory Visit: Payer: Self-pay

## 2022-10-24 ENCOUNTER — Encounter: Payer: Self-pay | Admitting: Nurse Practitioner

## 2022-10-24 ENCOUNTER — Ambulatory Visit (INDEPENDENT_AMBULATORY_CARE_PROVIDER_SITE_OTHER): Payer: Medicare Other | Admitting: Nurse Practitioner

## 2022-10-24 VITALS — BP 118/72 | HR 86 | Temp 97.8°F | Resp 16 | Ht 64.0 in | Wt 146.2 lb

## 2022-10-24 DIAGNOSIS — L570 Actinic keratosis: Secondary | ICD-10-CM

## 2022-10-24 DIAGNOSIS — M810 Age-related osteoporosis without current pathological fracture: Secondary | ICD-10-CM | POA: Diagnosis not present

## 2022-10-24 DIAGNOSIS — L989 Disorder of the skin and subcutaneous tissue, unspecified: Secondary | ICD-10-CM

## 2022-10-24 DIAGNOSIS — Z79899 Other long term (current) drug therapy: Secondary | ICD-10-CM | POA: Diagnosis not present

## 2022-10-24 DIAGNOSIS — E039 Hypothyroidism, unspecified: Secondary | ICD-10-CM

## 2022-10-24 DIAGNOSIS — M0609 Rheumatoid arthritis without rheumatoid factor, multiple sites: Secondary | ICD-10-CM | POA: Diagnosis not present

## 2022-10-24 NOTE — Assessment & Plan Note (Signed)
Labs ordered to check thyroid.  Has been doing well. Continue levothyroxine 75 mcg daily

## 2022-10-24 NOTE — Progress Notes (Signed)
BP 118/72   Pulse 86   Temp 97.8 F (36.6 C) (Oral)   Resp 16   Ht 5\' 4"  (1.626 m)   Wt 146 lb 3.2 oz (66.3 kg)   SpO2 97%   BMI 25.10 kg/m    Subjective:    Patient ID: Wanda Cohen, female    DOB: 1935-01-14, 88 y.o.   MRN: [id number]  HPI: Wanda Cohen is a 88 y.o. female  Chief Complaint  Patient presents with   Mass    Bump behind left ear. Purple in color, no pain for 6 weeks   Thyroid Problem   Skin lesion/actinic keratosis: patient has a few skin lesions one behind her left ear on her scalp, there is also an irregular shaped one on her back.  She reports the one on her scalp started off purple and is now more of a brown. Recommend dermatology evaluation.  Referral placed to dermatology.    Relevant past medical, surgical, family and social history reviewed and updated as indicated. Interim medical history since our last visit reviewed. Allergies and medications reviewed and updated.  Review of Systems  Constitutional: Negative for fever or weight change.  Respiratory: Negative for cough and shortness of breath.   Cardiovascular: Negative for chest pain or palpitations.  Gastrointestinal: Negative for abdominal pain, no bowel changes.  Musculoskeletal: Negative for gait problem or joint swelling.  Skin: Negative for rash. Positive for skin lesion Neurological: Negative for dizziness or headache.  No other specific complaints in a complete review of systems (except as listed in HPI above).      Objective:    BP 118/72   Pulse 86   Temp 97.8 F (36.6 C) (Oral)   Resp 16   Ht 5\' 4"  (1.626 m)   Wt 146 lb 3.2 oz (66.3 kg)   SpO2 97%   BMI 25.10 kg/m   Wt Readings from Last 3 Encounters:  10/24/22 146 lb 3.2 oz (66.3 kg)  07/04/22 142 lb 4.8 oz (64.5 kg)  01/01/22 134 lb 14.4 oz (61.2 kg)    Physical Exam  Constitutional: Patient appears well-developed and well-nourished. No distress.  HEENT: head atraumatic, normocephalic, pupils equal and  reactive to light, neck supple, throat within normal limits Cardiovascular: Normal rate, regular rhythm and normal heart sounds.  No murmur heard. No BLE edema. Pulmonary/Chest: Effort normal and breath sounds normal. No respiratory distress. Abdominal: Soft.  There is no tenderness. Skin: lesion noted on scalp behind left ear and several on her back Psychiatric: Patient has a normal mood and affect. behavior is normal. Judgment and thought content normal.  Results for orders placed or performed in visit on 07/04/22  Iron, TIBC and Ferritin Panel  Result Value Ref Range   Iron 69 45 - 160 mcg/dL   TIBC 102 725 - 366 mcg/dL (calc)   %SAT 23 16 - 45 % (calc)   Ferritin 79 16 - 288 ng/mL  TSH  Result Value Ref Range   TSH 1.92 0.40 - 4.50 mIU/L  VITAMIN D 25 Hydroxy (Vit-D Deficiency, Fractures)  Result Value Ref Range   Vit D, 25-Hydroxy 86 30 - 100 ng/mL      Assessment & Plan:   Problem List Items Addressed This Visit       Endocrine   Hypothyroidism (Chronic)    Labs ordered to check thyroid.  Has been doing well. Continue levothyroxine 75 mcg daily      Relevant Orders   TSH   Other  Visit Diagnoses     Actinic keratosis    -  Primary   referral placed to dermatology   Relevant Orders   Ambulatory referral to Dermatology   Skin lesions       referral placed to dermatology   Relevant Orders   Ambulatory referral to Dermatology   Age-related osteoporosis without current pathological fracture       getting pth   Relevant Orders   PTH, intact and calcium        Follow up plan: Return if symptoms worsen or fail to improve.

## 2022-10-25 LAB — TSH: TSH: 2.97 mIU/L (ref 0.40–4.50)

## 2022-10-25 LAB — PTH, INTACT AND CALCIUM
Calcium: 10 mg/dL (ref 8.6–10.4)
PTH: 73 pg/mL (ref 16–77)

## 2022-10-29 ENCOUNTER — Other Ambulatory Visit: Payer: Self-pay | Admitting: Nurse Practitioner

## 2022-10-29 DIAGNOSIS — M810 Age-related osteoporosis without current pathological fracture: Secondary | ICD-10-CM

## 2022-10-29 MED ORDER — ALENDRONATE SODIUM 70 MG PO TABS
70.0000 mg | ORAL_TABLET | ORAL | 11 refills | Status: AC
Start: 2022-10-29 — End: ?

## 2022-12-12 ENCOUNTER — Ambulatory Visit: Payer: Medicare Other | Admitting: Dermatology

## 2022-12-19 ENCOUNTER — Encounter: Payer: Self-pay | Admitting: Dermatology

## 2022-12-19 ENCOUNTER — Ambulatory Visit (INDEPENDENT_AMBULATORY_CARE_PROVIDER_SITE_OTHER): Payer: Medicare Other | Admitting: Dermatology

## 2022-12-19 VITALS — BP 103/60 | HR 79

## 2022-12-19 DIAGNOSIS — W908XXA Exposure to other nonionizing radiation, initial encounter: Secondary | ICD-10-CM | POA: Diagnosis not present

## 2022-12-19 DIAGNOSIS — L578 Other skin changes due to chronic exposure to nonionizing radiation: Secondary | ICD-10-CM | POA: Diagnosis not present

## 2022-12-19 DIAGNOSIS — L814 Other melanin hyperpigmentation: Secondary | ICD-10-CM | POA: Diagnosis not present

## 2022-12-19 DIAGNOSIS — Z1283 Encounter for screening for malignant neoplasm of skin: Secondary | ICD-10-CM | POA: Diagnosis not present

## 2022-12-19 DIAGNOSIS — L821 Other seborrheic keratosis: Secondary | ICD-10-CM | POA: Diagnosis not present

## 2022-12-19 DIAGNOSIS — M0609 Rheumatoid arthritis without rheumatoid factor, multiple sites: Secondary | ICD-10-CM | POA: Diagnosis not present

## 2022-12-19 DIAGNOSIS — D1801 Hemangioma of skin and subcutaneous tissue: Secondary | ICD-10-CM | POA: Diagnosis not present

## 2022-12-19 DIAGNOSIS — D229 Melanocytic nevi, unspecified: Secondary | ICD-10-CM

## 2022-12-19 DIAGNOSIS — L570 Actinic keratosis: Secondary | ICD-10-CM

## 2022-12-19 DIAGNOSIS — L219 Seborrheic dermatitis, unspecified: Secondary | ICD-10-CM

## 2022-12-19 LAB — LAB REPORT - SCANNED: EGFR: 39

## 2022-12-19 NOTE — Progress Notes (Signed)
   New Patient Visit   Subjective  Wanda Cohen is a 88 y.o. female who presents for the following: Skin Cancer Screening and Upper Body Skin Exam  The patient presents for Upper Body Skin Exam (UBSE) for skin cancer screening and mole check. The patient has spots, moles and lesions to be evaluated, some may be new or changing and the patient may have concern these could be cancer. Pt has no hx of skin cancer. Pt has growth behind her left ear for around 6 months that pcp would like evaluated. Pt has growth on back for unknown duration and is asymptomatic that pcp would also like checked.  She is accompanied by her daughter.  The following portions of the chart were reviewed this encounter and updated as appropriate: medications, allergies, medical history  Review of Systems:  No other skin or systemic complaints except as noted in HPI or Assessment and Plan.  Objective  Well appearing patient in no apparent distress; mood and affect are within normal limits.  All skin waist up examined. Relevant physical exam findings are noted in the Assessment and Plan.    Assessment & Plan   AK (actinic keratosis) Left Zygomatic Area  Destruction of lesion - Left Zygomatic Area  Destruction method: cryotherapy   Lesion destroyed using liquid nitrogen: Yes   Region frozen until ice ball extended beyond lesion: Yes     Skin cancer screening performed today.  Actinic Damage - Chronic condition, secondary to cumulative UV/sun exposure - diffuse scaly erythematous macules with underlying dyspigmentation - Recommend daily broad spectrum sunscreen SPF 30+ to sun-exposed areas, reapply every 2 hours as needed.  - Staying in the shade or wearing long sleeves, sun glasses (UVA+UVB protection) and wide brim hats (4-inch brim around the entire circumference of the hat) are also recommended for sun protection.  - Call for new or changing lesions.  Melanocytic Nevi - Tan-brown and/or  pink-flesh-colored symmetric macules and papules - Benign appearing on exam today - Observation - Call clinic for new or changing moles - Recommend daily use of broad spectrum spf 30+ sunscreen to sun-exposed areas.   SEBORRHEIC KERATOSIS - Stuck-on, waxy, tan-brown papules and/or plaques  - Benign-appearing - Discussed benign etiology and prognosis. - Observe - Call for any changes  LENTIGINES Exam: scattered tan macules Due to sun exposure Treatment Plan: Benign-appearing, observe. Recommend daily broad spectrum sunscreen SPF 30+ to sun-exposed areas, reapply every 2 hours as needed.  Call for any changes    HEMANGIOMA Exam: red papule(s) Discussed benign nature. Recommend observation. Call for changes.   Return in about 1 year (around 12/19/2023) for TBSE.  I, Tillie Fantasia, CMA, am acting as scribe for Gwenith Daily, MD.   Documentation: I have reviewed the above documentation for accuracy and completeness, and I agree with the above.  Gwenith Daily, MD

## 2022-12-19 NOTE — Patient Instructions (Addendum)

## 2023-01-01 DIAGNOSIS — Z23 Encounter for immunization: Secondary | ICD-10-CM | POA: Diagnosis not present

## 2023-01-03 ENCOUNTER — Ambulatory Visit: Payer: Medicare Other

## 2023-01-03 ENCOUNTER — Ambulatory Visit (INDEPENDENT_AMBULATORY_CARE_PROVIDER_SITE_OTHER): Payer: Medicare Other | Admitting: Nurse Practitioner

## 2023-01-03 ENCOUNTER — Other Ambulatory Visit: Payer: Self-pay

## 2023-01-03 ENCOUNTER — Encounter: Payer: Self-pay | Admitting: Nurse Practitioner

## 2023-01-03 VITALS — BP 124/78 | HR 84 | Temp 97.9°F | Resp 16 | Ht 64.0 in | Wt 144.7 lb

## 2023-01-03 VITALS — BP 124/78 | Ht 64.0 in | Wt 144.0 lb

## 2023-01-03 DIAGNOSIS — Z853 Personal history of malignant neoplasm of breast: Secondary | ICD-10-CM

## 2023-01-03 DIAGNOSIS — E039 Hypothyroidism, unspecified: Secondary | ICD-10-CM | POA: Diagnosis not present

## 2023-01-03 DIAGNOSIS — I10 Essential (primary) hypertension: Secondary | ICD-10-CM | POA: Diagnosis not present

## 2023-01-03 DIAGNOSIS — Z0000 Encounter for general adult medical examination without abnormal findings: Secondary | ICD-10-CM | POA: Diagnosis not present

## 2023-01-03 DIAGNOSIS — M810 Age-related osteoporosis without current pathological fracture: Secondary | ICD-10-CM

## 2023-01-03 DIAGNOSIS — H938X1 Other specified disorders of right ear: Secondary | ICD-10-CM

## 2023-01-03 DIAGNOSIS — Z85038 Personal history of other malignant neoplasm of large intestine: Secondary | ICD-10-CM | POA: Diagnosis not present

## 2023-01-03 DIAGNOSIS — M069 Rheumatoid arthritis, unspecified: Secondary | ICD-10-CM | POA: Diagnosis not present

## 2023-01-03 DIAGNOSIS — R350 Frequency of micturition: Secondary | ICD-10-CM | POA: Diagnosis not present

## 2023-01-03 DIAGNOSIS — D649 Anemia, unspecified: Secondary | ICD-10-CM | POA: Diagnosis not present

## 2023-01-03 DIAGNOSIS — E7800 Pure hypercholesterolemia, unspecified: Secondary | ICD-10-CM

## 2023-01-03 LAB — POCT URINALYSIS DIPSTICK
Bilirubin, UA: NEGATIVE
Glucose, UA: NEGATIVE
Ketones, UA: NEGATIVE
Nitrite, UA: NEGATIVE
Protein, UA: POSITIVE — AB
Spec Grav, UA: 1.02 (ref 1.010–1.025)
Urobilinogen, UA: 0.2 U/dL
pH, UA: 6 (ref 5.0–8.0)

## 2023-01-03 MED ORDER — CEPHALEXIN 500 MG PO CAPS
500.0000 mg | ORAL_CAPSULE | Freq: Two times a day (BID) | ORAL | 0 refills | Status: AC
Start: 2023-01-03 — End: 2023-01-08

## 2023-01-03 MED ORDER — LEVOTHYROXINE SODIUM 75 MCG PO TABS
75.0000 ug | ORAL_TABLET | Freq: Every day | ORAL | 1 refills | Status: DC
Start: 2023-01-03 — End: 2023-07-22

## 2023-01-03 MED ORDER — FOLIC ACID 1 MG PO TABS: 1.0000 mg | ORAL_TABLET | Freq: Every day | ORAL | 1 refills | Status: DC

## 2023-01-03 MED ORDER — LISINOPRIL 5 MG PO TABS: ORAL_TABLET | ORAL | 1 refills | Status: DC

## 2023-01-03 NOTE — Patient Instructions (Signed)
Wanda Cohen , Thank you for taking time to come for your Medicare Wellness Visit. I appreciate your ongoing commitment to your health goals. Please review the following plan we discussed and let me know if I can assist you in the future.   Referrals/Orders/Follow-Ups/Clinician Recommendations: none  This is a list of the screening recommended for you and due dates:  Health Maintenance  Topic Date Due   COVID-19 Vaccine (1) Never done   DTaP/Tdap/Td vaccine (1 - Tdap) Never done   Zoster (Shingles) Vaccine (1 of 2) Never done   Pneumonia Vaccine (1 of 1 - PCV) Never done   Medicare Annual Wellness Visit  01/03/2024   Flu Shot  Completed   DEXA scan (bone density measurement)  Completed   HPV Vaccine  Aged Out    Advanced directives: (In Chart) A copy of your advanced directives are scanned into your chart should your provider ever need it.  Next Medicare Annual Wellness Visit scheduled for next year: Yes

## 2023-01-03 NOTE — Progress Notes (Signed)
BP 124/78   Pulse 84   Temp 97.9 F (36.6 C) (Oral)   Resp 16   Ht 5\' 4"  (1.626 m)   Wt 144 lb 11.2 oz (65.6 kg)   SpO2 94%   BMI 24.84 kg/m    Subjective:    Patient ID: Wanda Cohen, female    DOB: [birth year], 88 y.o.   MRN: [id number]  HPI: Wanda Cohen is a 88 y.o. female  Chief Complaint  Patient presents with   Medical Management of Chronic Issues    Hypothyroidism:  She is currently taking levothyroxine 75 mcg daily.   Patient denies any palpitations, weight loss or gain, constipation. Last TSH was in normal range on 10/24/2022. No changes  Hypertension: -Medications: lisinopril 5 mg daily -Patient is compliant with above medications and reports no side effects. -Checking BP at home (average): 120s -Denies any SOB, CP, vision changes, LE edema or symptoms of hypotension -Diet: recommend DASH diet  -Exercise: recommend 150 min of physical activity weekly     Osteoporosis: prescribed fosamax, vitamin d and calcium.  Encouraged weight bearing exercises.  Patient reports she is not taking the fosamax.  But it is taking vitamin d and calcium IMPRESSION: Your patient Wanda Cohen completed a BMD test on 09/25/2022 using the [employer] DXA System (software version: 14.10) manufactured by [employer]. The following summarizes the results of our evaluation. Technologist: [facility] PATIENT BIOGRAPHICAL: Name: Wanda, Cohen Patient ID: [id number] Birth Date: 1935/01/28 Height: 63.0 in. Gender: Female Exam Date: 09/25/2022 Weight: 144.6 lbs. Indications: Advanced Age, Caucasian, History of Breast Cancer, History of colon cancer, Hypothyroid, Postmenopausal Fractures: Pelvis Treatments: Levothyroxine, Vitamin D DENSITOMETRY RESULTS: Site          Region     Measured Date Measured Age WHO Classification Young Adult T-score BMD         %Change vs. Previous Significant Change (*) DualFemur Total Left 09/25/2024 86.4 Osteoporosis -3.1 0.612 g/cm2    Right Forearm Radius 33% 09/25/2022 86.4 Normal -0.7 0.811 g/cm2 ASSESSMENT: The BMD measured at Femur Total Left is 0.612 g/cm2 with a T-score of -3.1. This patient is considered osteoporotic according to World Health Organization [facility]) criteria. The scan quality is good. Lumbar spine was not utilized due to advanced degenerative changes.  Rheumatoid arthritis:  She is currently taking methotrexate 2.5 mg. She currently sees Azucena Fallen ([place]) in rheumatology. No changes  History of Breast cancer/ colon cancer:  Mastectomy left side 30 years ago, 11 years later she had colon cancer she had to have chemo. Patient reports she is doing well. No changes  HLD: -Medications: none -Last lipid panel:  Lipid Panel     Component Value Date/Time   CHOL 183 01/01/2022 0948   TRIG 154 (H) 01/01/2022 0948   HDL 60 01/01/2022 0948   CHOLHDL 3.1 01/01/2022 0948   LDLCALC 97 01/01/2022 0948      Right ear muffled hearing:  she says her ear feels muffled in her right ear for about 6 weeks.  She is using Claritin every day. Ear exam was normal. No cerumen impaction.  continue claritin, add flonase, if no improvement will refer to ent. Still complaining of right ear fullness.  She did try allergy medication with no improvement.  Exam was normal. Will send to ent.    Anemia:  patient is slightly anemic. She denies any abnormal bleeding. Last cbc hgb/hct improved, iron normal.     Latest Ref Rng & Units 01/01/2022  9:48 AM 06/16/2021    6:29 AM 06/15/2021    4:59 PM  CBC  WBC 3.8 - 10.8 Thousand/uL 5.9  12.6  14.4   Hemoglobin 11.7 - 15.5 g/dL 16.1  09.6  04.5   Hematocrit 35.0 - 45.0 % 33.1  34.8  34.7   Platelets 140 - 400 Thousand/uL 228  184  203    Iron/TIBC/Ferritin/ %Sat    Component Value Date/Time   IRON 69 07/04/2022 1045   TIBC 296 07/04/2022 1045   FERRITIN 79 07/04/2022 1045   IRONPCTSAT 23 07/04/2022 1045    Urinary frequency: patient reports she has had a hard time  sleeping lately. She says that she keeps having to get up and go to the bathroom. She denies any fever.  She says she has had some burning but not bad. Will get urine dip to check for infection. If negative will start patient on over active bladder medication. Urine dip was positive for blood, protein, leukocytes, will send urine for culture, start keflex.  Push fluids.   Relevant past medical, surgical, family and social history reviewed and updated as indicated. Interim medical history since our last visit reviewed. Allergies and medications reviewed and updated.  Review of Systems  Constitutional: Negative for fever or weight change.  HEENT: Positive for nasal congestion, ears feel clogged and facial pressure Respiratory: Negative for cough and shortness of breath.   Cardiovascular: Negative for chest pain or palpitations.  Gastrointestinal: Negative for abdominal pain, no bowel changes.  Musculoskeletal: Negative for gait problem or joint swelling.  Skin: Negative for rash.  Neurological: Negative for dizziness or headache.  No other specific complaints in a complete review of systems (except as listed in HPI above).      Objective:    BP 124/78   Pulse 84   Temp 97.9 F (36.6 C) (Oral)   Resp 16   Ht 5\' 4"  (1.626 m)   Wt 144 lb 11.2 oz (65.6 kg)   SpO2 94%   BMI 24.84 kg/m   Wt Readings from Last 3 Encounters:  01/03/23 144 lb 11.2 oz (65.6 kg)  10/24/22 146 lb 3.2 oz (66.3 kg)  07/04/22 142 lb 4.8 oz (64.5 kg)    Physical Exam Constitutional: Patient appears well-developed and well-nourished.  No distress.  HEENT: head atraumatic, normocephalic, pupils equal and reactive to light, ears TMs clear, neck supple Cardiovascular: Normal rate, regular rhythm and normal heart sounds.  No murmur heard. Left ankle non pitting edema Pulmonary/Chest: Effort normal and breath sounds normal. No respiratory distress. Abdominal: Soft.  There is no tenderness. No CVA tenderness.   Psychiatric: Patient has a normal mood and affect. behavior is normal. Judgment and thought content normal.   Results for orders placed or performed in visit on 01/03/23  POCT urinalysis dipstick  Result Value Ref Range   Color, UA yellow    Clarity, UA cloudy    Glucose, UA Negative Negative   Bilirubin, UA negative    Ketones, UA negative    Spec Grav, UA 1.020 1.010 - 1.025   Blood, UA large    pH, UA 6.0 5.0 - 8.0   Protein, UA Positive (A) Negative   Urobilinogen, UA 0.2 0.2 or 1.0 E.U./dL   Nitrite, UA negtaive    Leukocytes, UA Large (3+) (A) Negative   Appearance cloudy    Odor none       Assessment & Plan:   Problem List Items Addressed This Visit  Cardiovascular and Mediastinum   Benign essential hypertension    Continue lisinopril 5 mg daily      Relevant Medications   lisinopril (ZESTRIL) 5 MG tablet     Endocrine   Hypothyroidism (Chronic)    Continue levothyroxine 75 mcg daily      Relevant Medications   levothyroxine (SYNTHROID) 75 MCG tablet     Musculoskeletal and Integument   Rheumatoid arthritis (HCC)    Stable managed by rheumatology        Other   Personal history of colon cancer    No concerns      Pure hypercholesterolemia    Continue working on lifestyle modification      Relevant Medications   lisinopril (ZESTRIL) 5 MG tablet   Personal history of breast cancer    No concerns      Other Visit Diagnoses     Age-related osteoporosis without current pathological fracture    -  Primary   Anemia, unspecified type       stable   Relevant Medications   folic acid (FOLVITE) 1 MG tablet   Urinary frequency       getting urine dip, culture, start keflex, push fluids   Relevant Medications   cephALEXin (KEFLEX) 500 MG capsule   Other Relevant Orders   POCT urinalysis dipstick (Completed)   Urine Culture   Ear fullness, right       referral placed to ent   Relevant Orders   Ambulatory referral to ENT   Anemia,  unspecified type       iron level ordered   Relevant Medications   folic acid (FOLVITE) 1 MG tablet         Follow up plan: Return in about 6 months (around 07/04/2023) for follow up.

## 2023-01-03 NOTE — Assessment & Plan Note (Signed)
-  Continue levothyroxine 75 mcg daily

## 2023-01-03 NOTE — Progress Notes (Signed)
Subjective:   Wanda Cohen is a 88 y.o. female who presents for an Initial Medicare Annual Wellness Visit.  Visit Complete: In person  Patient Medicare AWV questionnaire was completed by the patient on 20; I have confirmed that all information answered by patient is correct and no changes since this date. Cardiac Risk Factors include: advanced age (>87men, >55 women);dyslipidemia;hypertension;sedentary lifestyle    Objective:    Today's Vitals   01/03/23 1049  BP: 124/78  Weight: 144 lb (65.3 kg)  Height: 5\' 4"  (1.626 m)  PainSc: 6    Body mass index is 24.72 kg/m.     01/03/2023   11:13 AM 06/15/2021    6:56 PM 06/15/2021    2:28 PM 07/09/2017    6:34 AM 12/11/2012    9:49 AM  Advanced Directives  Does Patient Have a Medical Advance Directive? Yes Yes No;Yes Yes Patient has advance directive, copy not in chart  Type of Advance Directive Healthcare Power of [place];Living will Healthcare Power of [place];Living will Living will Healthcare Power of [place];Living will Living will;Healthcare Power of Attorney  Does patient want to make changes to medical advance directive?  No - Patient declined     Copy of Healthcare Power of Attorney in Chart?  No - copy requested  No - copy requested Copy requested from family  Would patient like information on creating a medical advance directive?  No - Patient declined No - Patient declined    Pre-existing out of facility DNR order (yellow form or pink MOST form)     No    Current Medications (verified) Outpatient Encounter Medications as of 01/03/2023  Medication Sig   acetaminophen (TYLENOL) 325 MG tablet Take 2 tablets (650 mg total) by mouth every 6 (six) hours.   cephALEXin (KEFLEX) 500 MG capsule Take 1 capsule (500 mg total) by mouth 2 (two) times daily for 5 days.   fluticasone (FLONASE) 50 MCG/ACT nasal spray Place 2 sprays into both nostrils daily.   folic acid (FOLVITE) 1 MG tablet Take 1 tablet (1 mg total) by mouth daily.    golimumab (SIMPONI ARIA) 50 MG/4ML SOLN injection 2mg /kg   levothyroxine (SYNTHROID) 75 MCG tablet Take 1 tablet (75 mcg total) by mouth daily.   lisinopril (ZESTRIL) 5 MG tablet Take 1 tablet daily   methotrexate 2.5 MG tablet Take by mouth See admin instructions. Take 7 tablets weekly on the same day. Sunday   Vitamin D, Ergocalciferol, (DRISDOL) 1.25 MG (50000 UNIT) CAPS capsule Take 1 capsule (50,000 Units total) by mouth once a week.   [DISCONTINUED] alendronate (FOSAMAX) 70 MG tablet Take 1 tablet (70 mg total) by mouth every 7 (seven) days. Take with a full glass of water on an empty stomach. (Patient not taking: Reported on 01/03/2023)   [DISCONTINUED] folic acid (FOLVITE) 1 MG tablet Take 1 tablet (1 mg total) by mouth daily.   [DISCONTINUED] levothyroxine (SYNTHROID) 75 MCG tablet Take 1 tablet (75 mcg total) by mouth daily.   [DISCONTINUED] lisinopril (ZESTRIL) 5 MG tablet Take 1 tablet daily   No facility-administered encounter medications on file as of 01/03/2023.    Allergies (verified) Patient has no known allergies.   History: Past Medical History:  Diagnosis Date   Colon cancer, ascending (HCC)    hx of   Hx of breast cancer    Hypertension    Hypothyroidism    Past Surgical History:  Procedure Laterality Date   BREAST EXCISIONAL BIOPSY Right    COLONOSCOPY N/A 12/11/2012  Procedure: COLONOSCOPY;  Surgeon: Malissa Hippo, MD;  [facility];  Service: Endoscopy;  Laterality: N/A;  1030   COLONOSCOPY N/A 07/09/2017   Procedure: COLONOSCOPY;  Surgeon: Franky Macho, MD;  Location: [facility];  Service: Gastroenterology;  Laterality: N/A;   MASTECTOMY Left    RIGHT COLECTOMY     Family History  Problem Relation Age of Onset   Colon cancer Neg Hx    Breast cancer Neg Hx    Social History   Socioeconomic History   Marital status: Widowed    Spouse name: Not on file   Number of children: Not on file   Years of education: Not on file   Highest  education level: Not on file  Occupational History   Not on file  Tobacco Use   Smoking status: Never   Smokeless tobacco: Never  Vaping Use   Vaping status: Never Used  Substance and Sexual Activity   Alcohol use: No   Drug use: No   Sexual activity: Not on file  Other Topics Concern   Not on file  Social History Narrative   Not on file   Social Determinants of Health   Financial Resource Strain: Low Risk  (01/03/2023)   Overall Financial Resource Strain (CARDIA)    Difficulty of Paying Living Expenses: Not hard at all  Food Insecurity: No Food Insecurity (01/03/2023)   Hunger Vital Sign    Worried About Running Out of Food in the Last Year: Never true    Ran Out of Food in the Last Year: Never true  Transportation Needs: No Transportation Needs (01/03/2023)   PRAPARE - Administrator, Civil Service (Medical): No    Lack of Transportation (Non-Medical): No  Physical Activity: Inactive (01/03/2023)   Exercise Vital Sign    Days of Exercise per Week: 0 days    Minutes of Exercise per Session: 0 min  Stress: Stress Concern Present (01/03/2023)   [employer] of Occupational Health - Occupational Stress Questionnaire    Feeling of Stress : To some extent  Social Connections: Moderately Integrated (01/03/2023)   Social Connection and Isolation Panel [NHANES]    Frequency of Communication with Friends and Family: Three times a week    Frequency of Social Gatherings with Friends and Family: Three times a week    Attends Religious Services: More than 4 times per year    Active Member of Clubs or Organizations: No    Attends Banker Meetings: Never    Marital Status: Married    Tobacco Counseling Counseling given: Not Answered   Clinical Intake:  Pre-visit preparation completed: No  Pain : 0-10 Pain Score: 6  Pain Type: Acute pain Pain Location: Leg (behind knee and calf) Pain Orientation: Left Pain Descriptors / Indicators: Aching Pain  Onset: Yesterday Pain Frequency: Intermittent    Nutritional Risks: None Diabetes: No  How often do you need to have someone help you when you read instructions, pamphlets, or other written materials from your doctor or pharmacy?: 1 - Never  Interpreter Needed?: No  Comments: lives at [facility] Information entered by :: B.Lanyah Spengler,LPN   Activities of Daily Living    01/03/2023   11:13 AM 01/03/2023   11:00 AM  In your present state of health, do you have any difficulty performing the following activities:  Hearing? 1 1  Vision?  0  Difficulty concentrating or making decisions?  0  Walking or climbing stairs?  1  Dressing or  bathing?  1  Doing errands, shopping?  0  Preparing Food and eating ?  Y  Using the Toilet?  N  In the past six months, have you accidently leaked urine?  N  Do you have problems with loss of bowel control?  N  Managing your Medications?  N  Managing your Finances?  N  Housekeeping or managing your Housekeeping?  N    Patient Care Team: Berniece Salines, FNP as PCP - General (Nurse Practitioner)  Indicate any recent Medical Services you may have received from other than [facility] providers in the past year (date may be approximate).     Assessment:   This is a routine wellness examination for [place].  Hearing/Vision screen Hearing Screening - Comments:: Pt says adequate hearing Vision Screening - Comments:: Pt says vision is good  Dr Clydene Pugh   Goals Addressed             This Visit's Progress    Patient Stated       Pt desires to stay healthy       Depression Screen    01/03/2023   10:56 AM 01/03/2023    9:52 AM 10/24/2022    1:49 PM 07/04/2022   10:05 AM 01/01/2022    9:06 AM  PHQ 2/9 Scores  PHQ - 2 Score 0 0 0 0 0    Fall Risk    01/03/2023   10:52 AM 01/03/2023    9:51 AM 10/24/2022    1:49 PM 07/04/2022   10:05 AM 01/01/2022    9:05 AM  Fall Risk   Falls in the past year? 1 1 0 0 1  Number falls in past yr: 1 1 0 0 1   Comment     3  Injury with Fall? 0 0 0 0 1  Risk for fall due to : No Fall Risks;Impaired balance/gait;Impaired mobility;History of fall(s);Orthopedic patient Impaired vision;Impaired mobility;Impaired balance/gait;History of fall(s)   History of fall(s);Impaired balance/gait;Impaired mobility;Impaired vision  Follow up Education provided Falls evaluation completed   Falls evaluation completed;Education provided    MEDICARE RISK AT HOME: Medicare Risk at Home Any stairs in or around the home?: No If so, are there any without handrails?: No Home free of loose throw rugs in walkways, pet beds, electrical cords, etc?: Yes Adequate lighting in your home to reduce risk of falls?: Yes Life alert?: Yes Use of a cane, walker or w/c?: Yes Grab bars in the bathroom?: Yes Shower chair or bench in shower?: Yes Elevated toilet seat or a handicapped toilet?: Yes  TIMED UP AND GO:  Was the test performed? Yes  Length of time to ambulate 10 feet: 20 sec Gait slow and steady with assistive device    Cognitive Function:        01/03/2023   11:02 AM  6CIT Screen  What Year? 0 points  What month? 0 points  What time? 0 points  Count back from 20 0 points  Months in reverse 0 points  Repeat phrase 0 points  Total Score 0 points    Immunizations Immunization History  Administered Date(s) Administered   Influenza,inj,Quad PF,6-35 Mos 12/31/2022   Influenza-Unspecified 12/31/2012, 12/31/2017    TDAP status: Up to date  Flu Vaccine status: Up to date  Pneumococcal vaccine status: Up to date  Covid-19 vaccine status: Completed vaccines  Qualifies for Shingles Vaccine? Yes   Zostavax completed Yes   Shingrix Completed?: Yes  Screening Tests Health Maintenance  Topic Date Due  COVID-19 Vaccine (1) Never done   DTaP/Tdap/Td (1 - Tdap) Never done   Zoster Vaccines- Shingrix (1 of 2) Never done   Pneumonia Vaccine 28+ Years old (1 of 1 - PCV) Never done   Medicare Annual Wellness  (AWV)  01/03/2024   INFLUENZA VACCINE  Completed   DEXA SCAN  Completed   HPV VACCINES  Aged Out    Health Maintenance  Health Maintenance Due  Topic Date Due   COVID-19 Vaccine (1) Never done   DTaP/Tdap/Td (1 - Tdap) Never done   Zoster Vaccines- Shingrix (1 of 2) Never done   Pneumonia Vaccine 13+ Years old (1 of 1 - PCV) Never done    Colorectal cancer screening: No longer required.   Mammogram status: No longer required due to age.  Bone Density status: Completed 09/25/22. Results reflect: Bone density results: NORMAL. Repeat every 5 years.  Lung Cancer Screening: (Low Dose CT Chest recommended if Age 19-80 years, 20 pack-year currently smoking OR have quit w/in 15years.) does not qualify.   Lung Cancer Screening Referral: no  Additional Screening:  Hepatitis C Screening: does not qualify; Completed no  Vision Screening: Recommended annual ophthalmology exams for early detection of glaucoma and other disorders of the eye. Is the patient up to date with their annual eye exam?  Yes  Who is the provider or what is the name of the office in which the patient attends annual eye exams? Dr Clydene Pugh If pt is not established with a provider, would they like to be referred to a provider to establish care? No .   Dental Screening: Recommended annual dental exams for proper oral hygiene  Diabetic Foot Exam: n/a  Community Resource Referral / Chronic Care Management: CRR required this visit?  No   CCM required this visit?  No    Plan:    I have personally reviewed and noted the following in the patient's chart:   Medical and social history Use of alcohol, tobacco or illicit drugs  Current medications and supplements including opioid prescriptions. Patient is not currently taking opioid prescriptions. Functional ability and status Nutritional status Physical activity Advanced directives List of other physicians Hospitalizations, surgeries, and ER visits in previous 12  months Vitals Screenings to include cognitive, depression, and falls Referrals and appointments  In addition, I have reviewed and discussed with patient certain preventive protocols, quality metrics, and best practice recommendations. A written personalized care plan for preventive services as well as general preventive health recommendations were provided to patient.    Sue Lush, LPN   16/04/958   After Visit Summary: (In Person-Printed) AVS printed and given to the patient  Nurse Notes: Pt in with her daughter for AWV. Pt lives at [facility] independently . They have no concerns or questions at this time.

## 2023-01-03 NOTE — Assessment & Plan Note (Signed)
Continue working on lifestyle modification.  

## 2023-01-03 NOTE — Assessment & Plan Note (Signed)
Stable; managed by rheumatology.

## 2023-01-03 NOTE — Assessment & Plan Note (Signed)
Continue lisinopril 5mg daily

## 2023-01-03 NOTE — Assessment & Plan Note (Signed)
No concerns. 

## 2023-01-05 LAB — URINE CULTURE
MICRO NUMBER:: 15549479
SPECIMEN QUALITY:: ADEQUATE

## 2023-01-09 ENCOUNTER — Encounter: Payer: Self-pay | Admitting: Nurse Practitioner

## 2023-01-14 ENCOUNTER — Encounter: Payer: Self-pay | Admitting: Physician Assistant

## 2023-01-14 ENCOUNTER — Ambulatory Visit (INDEPENDENT_AMBULATORY_CARE_PROVIDER_SITE_OTHER): Payer: Medicare Other | Admitting: Physician Assistant

## 2023-01-14 ENCOUNTER — Other Ambulatory Visit (HOSPITAL_COMMUNITY)
Admission: RE | Admit: 2023-01-14 | Discharge: 2023-01-14 | Disposition: A | Discharge status: Home / Self Care | Payer: Medicare Other | Source: Ambulatory Visit | Attending: Physician Assistant | Admitting: Physician Assistant

## 2023-01-14 VITALS — BP 114/72 | HR 79 | Temp 97.7°F | Resp 16 | Ht 64.0 in | Wt 145.2 lb

## 2023-01-14 DIAGNOSIS — R319 Hematuria, unspecified: Secondary | ICD-10-CM

## 2023-01-14 DIAGNOSIS — Z79899 Other long term (current) drug therapy: Secondary | ICD-10-CM | POA: Diagnosis not present

## 2023-01-14 DIAGNOSIS — M79642 Pain in left hand: Secondary | ICD-10-CM | POA: Diagnosis not present

## 2023-01-14 DIAGNOSIS — N898 Other specified noninflammatory disorders of vagina: Secondary | ICD-10-CM | POA: Diagnosis not present

## 2023-01-14 DIAGNOSIS — Z111 Encounter for screening for respiratory tuberculosis: Secondary | ICD-10-CM | POA: Diagnosis not present

## 2023-01-14 DIAGNOSIS — N390 Urinary tract infection, site not specified: Secondary | ICD-10-CM | POA: Diagnosis not present

## 2023-01-14 DIAGNOSIS — D649 Anemia, unspecified: Secondary | ICD-10-CM | POA: Diagnosis not present

## 2023-01-14 DIAGNOSIS — M0609 Rheumatoid arthritis without rheumatoid factor, multiple sites: Secondary | ICD-10-CM | POA: Diagnosis not present

## 2023-01-14 DIAGNOSIS — R300 Dysuria: Secondary | ICD-10-CM

## 2023-01-14 DIAGNOSIS — N1832 Chronic kidney disease, stage 3b: Secondary | ICD-10-CM | POA: Diagnosis not present

## 2023-01-14 DIAGNOSIS — R5382 Chronic fatigue, unspecified: Secondary | ICD-10-CM | POA: Diagnosis not present

## 2023-01-14 DIAGNOSIS — M79641 Pain in right hand: Secondary | ICD-10-CM | POA: Diagnosis not present

## 2023-01-14 DIAGNOSIS — Z6824 Body mass index (BMI) 24.0-24.9, adult: Secondary | ICD-10-CM | POA: Diagnosis not present

## 2023-01-14 LAB — POCT URINALYSIS DIPSTICK
Bilirubin, UA: NEGATIVE
Glucose, UA: NEGATIVE
Ketones, UA: NEGATIVE
Nitrite, UA: NEGATIVE
Protein, UA: NEGATIVE
Spec Grav, UA: 1.01 (ref 1.010–1.025)
Urobilinogen, UA: 0.2 U/dL
pH, UA: 6.5 (ref 5.0–8.0)

## 2023-01-14 NOTE — Progress Notes (Unsigned)
Acute Office Visit   Patient: Wanda Cohen   DOB: 11/13/1934   88 y.o. Female  MRN: [id number] Visit Date: 01/14/2023  Today's healthcare provider: Oswaldo Conroy Honestee Revard, PA-C  Introduced myself to the patient as a Secondary school teacher and provided education on APPs in clinical practice.    Chief Complaint  Patient presents with  . Acne    Onset for months, pimples near vaginal area. No pain  . Dysuria    Previously treated but pt states still feels burning every now and then   Subjective    HPI HPI     Acne    Additional comments: Onset for months, pimples near vaginal area. No pain        Dysuria    Additional comments: Previously treated but pt states still feels burning every now and then      Last edited by Forde Radon, CMA on 01/14/2023  2:46 PM.      Concern for white bumps in genital area   Onset: gradual  Duration: ongoing for several months  She reports they can get quite large - sometimes has up to 5-6 at a time  Aggravating: nothing  Alleviating : nothing  Associated symptoms: she denies pain,  She has squeezed one and tried to get internal contents out - reports a hard core and seemed similar to white head  Interventions: none  Concern for dysuria  Onset: gradual  Duration: ongoing since the beginning of Oct  Associated symptoms: she reports some persistent burning with urination  Interventions: she recently finished her Abx course from PCP  She reports there was some relief after she completed course but there is lingering dysuria       Medications: Outpatient Medications Prior to Visit  Medication Sig  . acetaminophen (TYLENOL) 325 MG tablet Take 2 tablets (650 mg total) by mouth every 6 (six) hours.  . fluticasone (FLONASE) 50 MCG/ACT nasal spray Place 2 sprays into both nostrils daily.  . folic acid (FOLVITE) 1 MG tablet Take 1 tablet (1 mg total) by mouth daily.  Marland Kitchen golimumab (SIMPONI ARIA) 50 MG/4ML SOLN injection 2mg /kg  .  levothyroxine (SYNTHROID) 75 MCG tablet Take 1 tablet (75 mcg total) by mouth daily.  Marland Kitchen lisinopril (ZESTRIL) 5 MG tablet Take 1 tablet daily  . methotrexate 2.5 MG tablet Take by mouth See admin instructions. Take 7 tablets weekly on the same day. Sunday  . Vitamin D, Ergocalciferol, (DRISDOL) 1.25 MG (50000 UNIT) CAPS capsule Take 1 capsule (50,000 Units total) by mouth once a week.   No facility-administered medications prior to visit.    Review of Systems  Constitutional:  Negative for chills and fever.  Genitourinary:  Positive for dysuria and frequency. Negative for difficulty urinating, flank pain, hematuria, vaginal bleeding, vaginal discharge and vaginal pain.    {Insert previous labs (optional):23779} {See past labs  Heme  Chem  Endocrine  Serology  Results Review (optional):1}   Objective    BP 114/72   Pulse 79   Temp 97.7 F (36.5 C) (Oral)   Resp 16   Ht 5\' 4"  (1.626 m)   Wt 145 lb 3.2 oz (65.9 kg)   SpO2 99%   BMI 24.92 kg/m  {Insert last BP/Wt (optional):23777}{See vitals history (optional):1}   Physical Exam Vitals reviewed.  Constitutional:      General: She is awake.     Appearance: Normal appearance. She is well-developed and well-groomed.  HENT:  Head: Normocephalic and atraumatic.  Pulmonary:     Effort: Pulmonary effort is normal.  Genitourinary:    Pubic Area: No rash or pubic lice.      Tanner stage (genital): 5.     Comments: Chaperone: patient's daughter remained in the room during exam   Musculoskeletal:     Cervical back: Normal range of motion.  Neurological:     Mental Status: She is alert.  Psychiatric:        Behavior: Behavior is cooperative.      No results found for any visits on 01/14/23.  Assessment & Plan      No follow-ups on file.

## 2023-01-16 ENCOUNTER — Encounter: Payer: Self-pay | Admitting: Nurse Practitioner

## 2023-01-16 LAB — CERVICOVAGINAL ANCILLARY ONLY
Bacterial Vaginitis (gardnerella): NEGATIVE
Candida Glabrata: NEGATIVE
Candida Vaginitis: POSITIVE — AB
Comment: NEGATIVE
Comment: NEGATIVE
Comment: NEGATIVE
Comment: NEGATIVE
Trichomonas: NEGATIVE

## 2023-01-16 LAB — URINE CULTURE
MICRO NUMBER:: 15591769
SPECIMEN QUALITY:: ADEQUATE

## 2023-01-16 MED ORDER — SULFAMETHOXAZOLE-TRIMETHOPRIM 800-160 MG PO TABS
1.0000 | ORAL_TABLET | Freq: Two times a day (BID) | ORAL | 0 refills | Status: DC
Start: 2023-01-16 — End: 2023-01-23

## 2023-01-16 NOTE — Assessment & Plan Note (Signed)
Acute, new problem Patient reports symptoms comprised of the following: dysuria,increased urinary frequency since the beginning of Oct  Results of UA are consistent with UTI - urine sample sent for culture to determine causative organism and susceptibility- results to dictate further management  Will start bactrim to assist with management  discussed importance of finishing entire course of abx and staying well hydrated while recovering from UTI Reviewed ED precautions with patient Follow up as needed for persistent or worsening symptoms

## 2023-01-16 NOTE — Progress Notes (Signed)
Your urine culture was positive for E. coli This means that you have a UTI caused by this bacteria.  I have sent in a medication called Bactrim to treat it.  Your urine culture shows that the bacteria causing your UTI was susceptible to this medication so that should be very effective at treating it Please take the medication by mouth twice per day for 7 days.  If you are still having symptoms after you finish it please let us know.  Please make sure that you are staying well-hydrated and avoid holding your urine for prolonged periods of time to further assist with clearing the infection

## 2023-01-17 ENCOUNTER — Other Ambulatory Visit: Payer: Self-pay | Admitting: Nurse Practitioner

## 2023-01-17 DIAGNOSIS — B379 Candidiasis, unspecified: Secondary | ICD-10-CM

## 2023-01-17 DIAGNOSIS — N3000 Acute cystitis without hematuria: Secondary | ICD-10-CM

## 2023-01-17 MED ORDER — FLUCONAZOLE 150 MG PO TABS
150.0000 mg | ORAL_TABLET | ORAL | 0 refills | Status: DC | PRN
Start: 2023-01-17 — End: 2023-02-27

## 2023-01-17 MED ORDER — AMOXICILLIN-POT CLAVULANATE 875-125 MG PO TABS
1.0000 | ORAL_TABLET | Freq: Two times a day (BID) | ORAL | 0 refills | Status: AC
Start: 2023-01-17 — End: 2023-01-22

## 2023-01-28 ENCOUNTER — Encounter: Payer: Self-pay | Admitting: Nurse Practitioner

## 2023-01-28 ENCOUNTER — Other Ambulatory Visit: Payer: Self-pay | Admitting: Nurse Practitioner

## 2023-01-28 DIAGNOSIS — N390 Urinary tract infection, site not specified: Secondary | ICD-10-CM

## 2023-01-30 NOTE — Progress Notes (Unsigned)
There were no vitals taken for this visit.   Subjective:    Patient ID: Wanda Cohen, female    DOB: Apr 15, 1934, 88 y.o.   MRN: [id number]  HPI: Wanda Cohen is a 88 y.o. female  No chief complaint on file.  URINARY SYMPTOMS Seen multiple times for uti symptoms.  01/03/2023, 01/14/2023 urine culture has grown e.coli both times.  She has been treated with Augmentin and keflex. Patient reports she continues to have symptoms. She took an OTC test which read that she still had an infection. Will recheck today.  Dysuria: {Blank single:19197::"yes","no","burning"} Urinary frequency: {Blank single:19197::"yes","no"} Urgency: {Blank single:19197::"yes","no"} Small volume voids: {Blank single:19197::"yes","no"} Symptom severity: {Blank single:19197::"yes","no"} Urinary incontinence: {Blank single:19197::"yes","no"} Foul odor: {Blank single:19197::"yes","no"} Hematuria: {Blank single:19197::"yes","no"} Abdominal pain: {Blank single:19197::"yes","no"} Back pain: {Blank single:19197::"yes","no"} Suprapubic pain/pressure: {Blank single:19197::"yes","no"} Flank pain: {Blank single:19197::"yes","no"} Fever:  {Blank multiple:19196::"yes","no","subjective","low grade"} Vomiting: {Blank single:19197::"yes","no"} Relief with cranberry juice: {Blank single:19197::"yes","no"} Relief with pyridium: {Blank single:19197::"yes","no"} Status: better/worse/stable Previous urinary tract infection: {Blank single:19197::"yes","no"} Recurrent urinary tract infection: {Blank single:19197::"yes","no"} Sexual activity: No sexually active/monogomous/practicing safe sex History of sexually transmitted disease: {Blank single:19197::"yes","no"} Penile discharge: {Blank single:19197::"yes","no"} Treatments attempted: {Blank multiple:19196::"none","antibiotics","pyridium","cranberry","increasing fluids"}   Relevant past medical, surgical, family and social history reviewed and updated as indicated. Interim  medical history since our last visit reviewed. Allergies and medications reviewed and updated.  Review of Systems  Ten systems reviewed and is negative except as mentioned in HPI ***      Objective:    There were no vitals taken for this visit.  Wt Readings from Last 3 Encounters:  01/14/23 145 lb 3.2 oz (65.9 kg)  01/03/23 144 lb (65.3 kg)  01/03/23 144 lb 11.2 oz (65.6 kg)    Physical Exam  Constitutional: Patient appears well-developed and well-nourished. Obese *** No distress.  HEENT: head atraumatic, normocephalic, pupils equal and reactive to light, ears ***, neck supple, throat within normal limits Cardiovascular: Normal rate, regular rhythm and normal heart sounds.  No murmur heard. No BLE edema. Pulmonary/Chest: Effort normal and breath sounds normal. No respiratory distress. Abdominal: Soft.  There is no tenderness. Psychiatric: Patient has a normal mood and affect. behavior is normal. Judgment and thought content normal.  Results for orders placed or performed in visit on 01/16/23  Lab report - scanned  Result Value Ref Range   EGFR 39.0       Assessment & Plan:   Problem List Items Addressed This Visit   None    Follow up plan: No follow-ups on file.

## 2023-01-31 ENCOUNTER — Encounter: Payer: Self-pay | Admitting: Nurse Practitioner

## 2023-01-31 ENCOUNTER — Other Ambulatory Visit: Payer: Self-pay

## 2023-01-31 ENCOUNTER — Ambulatory Visit (INDEPENDENT_AMBULATORY_CARE_PROVIDER_SITE_OTHER): Payer: Medicare Other | Admitting: Nurse Practitioner

## 2023-01-31 VITALS — BP 126/84 | HR 94 | Temp 97.9°F | Resp 16 | Ht 64.0 in | Wt 144.2 lb

## 2023-01-31 DIAGNOSIS — N390 Urinary tract infection, site not specified: Secondary | ICD-10-CM

## 2023-01-31 LAB — POCT URINALYSIS DIPSTICK
Bilirubin, UA: NEGATIVE
Glucose, UA: NEGATIVE
Ketones, UA: NEGATIVE
Nitrite, UA: NEGATIVE
Protein, UA: POSITIVE — AB
Spec Grav, UA: 1.02 (ref 1.010–1.025)
Urobilinogen, UA: 0.2 U/dL
pH, UA: 5 (ref 5.0–8.0)

## 2023-01-31 MED ORDER — CEFTRIAXONE SODIUM 500 MG IJ SOLR
500.0000 mg | Freq: Once | INTRAMUSCULAR | Status: AC
Start: 2023-01-31 — End: 2023-01-31
  Administered 2023-01-31: 500 mg via INTRAMUSCULAR

## 2023-01-31 MED ORDER — CEFTRIAXONE SODIUM 1 G IJ SOLR
1.0000 g | Freq: Once | INTRAMUSCULAR | Status: DC
Start: 2023-01-31 — End: 2023-01-31

## 2023-02-02 LAB — URINE CULTURE
MICRO NUMBER:: 15670329
SPECIMEN QUALITY:: ADEQUATE

## 2023-02-04 ENCOUNTER — Encounter: Payer: Self-pay | Admitting: Nurse Practitioner

## 2023-02-07 ENCOUNTER — Ambulatory Visit (INDEPENDENT_AMBULATORY_CARE_PROVIDER_SITE_OTHER): Payer: Medicare Other | Admitting: Emergency Medicine

## 2023-02-07 DIAGNOSIS — N390 Urinary tract infection, site not specified: Secondary | ICD-10-CM | POA: Diagnosis not present

## 2023-02-07 LAB — POCT URINALYSIS DIPSTICK
Bilirubin, UA: NEGATIVE
Blood, UA: NEGATIVE
Glucose, UA: NEGATIVE
Ketones, UA: NEGATIVE
Leukocytes, UA: NEGATIVE
Nitrite, UA: NEGATIVE
Protein, UA: NEGATIVE
Spec Grav, UA: 1.02 (ref 1.010–1.025)
Urobilinogen, UA: 0.2 U/dL
pH, UA: 6 (ref 5.0–8.0)

## 2023-02-07 NOTE — Progress Notes (Signed)
poct

## 2023-02-13 DIAGNOSIS — M0609 Rheumatoid arthritis without rheumatoid factor, multiple sites: Secondary | ICD-10-CM | POA: Diagnosis not present

## 2023-02-13 DIAGNOSIS — Z111 Encounter for screening for respiratory tuberculosis: Secondary | ICD-10-CM | POA: Diagnosis not present

## 2023-02-13 DIAGNOSIS — R5382 Chronic fatigue, unspecified: Secondary | ICD-10-CM | POA: Diagnosis not present

## 2023-02-22 ENCOUNTER — Encounter: Payer: Medicare Other | Admitting: Urology

## 2023-02-27 ENCOUNTER — Ambulatory Visit: Payer: Medicare Other | Admitting: Urology

## 2023-02-27 VITALS — BP 158/76 | HR 87 | Ht 64.0 in | Wt 146.2 lb

## 2023-02-27 DIAGNOSIS — Z87440 Personal history of urinary (tract) infections: Secondary | ICD-10-CM | POA: Diagnosis not present

## 2023-02-27 DIAGNOSIS — N390 Urinary tract infection, site not specified: Secondary | ICD-10-CM | POA: Diagnosis not present

## 2023-02-27 DIAGNOSIS — Z09 Encounter for follow-up examination after completed treatment for conditions other than malignant neoplasm: Secondary | ICD-10-CM

## 2023-02-27 LAB — URINALYSIS, COMPLETE
Bilirubin, UA: NEGATIVE
Glucose, UA: NEGATIVE
Ketones, UA: NEGATIVE
Nitrite, UA: NEGATIVE
Protein,UA: NEGATIVE
RBC, UA: NEGATIVE
Specific Gravity, UA: 1.015 (ref 1.005–1.030)
Urobilinogen, Ur: 0.2 mg/dL (ref 0.2–1.0)
pH, UA: 7 (ref 5.0–7.5)

## 2023-02-27 LAB — MICROSCOPIC EXAMINATION

## 2023-02-27 LAB — BLADDER SCAN AMB NON-IMAGING: Scan Result: 0

## 2023-02-27 MED ORDER — ESTRADIOL 0.1 MG/GM VA CREA
TOPICAL_CREAM | VAGINAL | 12 refills | Status: AC
Start: 2023-02-27 — End: ?

## 2023-02-27 NOTE — Progress Notes (Signed)
Marcelle Overlie Plume,acting as a scribe for Vanna Scotland, MD.,have documented all relevant documentation on the behalf of Vanna Scotland, MD,as directed by  Vanna Scotland, MD while in the presence of Vanna Scotland, MD.  02/27/23 10:50 AM   Kandyce Rud [birth year] [id number]  Referring provider: Berniece Salines, FNP [address] [place],  [place] [address]  Chief Complaint  Patient presents with   Establish Care   Recurrent UTI    HPI:  88 year-old female who presents today for further evaluation of urinary tract infections.   She mentioned at an appointment on 01/03/2023 that she was having a lot of urinary urgency frequency. Her urinalysis was suspicious and she ended up growing E.coli resistant to ampicillin and fluoroquinolones. She was prescribed antibiotics in the form of Keflex.   She returned to the office on 01/14/2023 and was still having dysuria but was also complaining of lesions, white bumps in the vulvar area. Pelvic exam showed several hard nodular described as "whitehead" along the labia majora, but they did not appear to be actively draining, erythematous, swollen, or tender. Repeat urinalysis on that day was again suspicious and she grew the same E.coli bacteria. She was treated this time with Bactrim which was changed to Augmentin.   She was seen again on 10/31 with ongoing symptoms. Her urinalysis remained positive again on this day. She was treated with an injection of ceftriaxone. All of her antibiotic courses were for 5 days. On her last infection, on 10/31, she did no receive any additional antibiotic other than the one time dose of ceftriaxone. She does not have any recent upper tract imaging.   Her urinalysis today shows 6-10 WBC per high-powered field but otherwise negative.   She reports ongoing symptoms, though dysuria has improved and is no longer experiencing burning. She also reports nocturia x2-4.  She did have a CT scan last year  at the time of pelvic fracture that did show a stone in her bladder.  Sheddenies personal history of kidney stones.  Results for orders placed or performed in visit on 02/27/23  BLADDER SCAN AMB NON-IMAGING  Result Value Ref Range   Scan Result 0 ml      PMH: Past Medical History:  Diagnosis Date   Colon cancer, ascending (HCC)    hx of   Hx of breast cancer    Hypertension    Hypothyroidism     Surgical History: Past Surgical History:  Procedure Laterality Date   BREAST EXCISIONAL BIOPSY Right    COLONOSCOPY N/A 12/11/2012   Procedure: COLONOSCOPY;  Surgeon: Malissa Hippo, MD;  [facility];  Service: Endoscopy;  Laterality: N/A;  1030   COLONOSCOPY N/A 07/09/2017   Procedure: COLONOSCOPY;  Surgeon: Franky Macho, MD;  [facility];  Service: Gastroenterology;  Laterality: N/A;   MASTECTOMY Left    RIGHT COLECTOMY      Home Medications:  Allergies as of 02/27/2023   No Known Allergies      Medication List        Accurate as of February 27, 2023 10:50 AM. If you have any questions, ask your nurse or doctor.          STOP taking these medications    fluconazole 150 MG tablet Commonly known as: DIFLUCAN Stopped by: Vanna Scotland       TAKE these medications    acetaminophen 325 MG tablet Commonly known as: TYLENOL Take 2 tablets (650 mg total) by  mouth every 6 (six) hours.   estradiol 0.1 MG/GM vaginal cream Commonly known as: ESTRACE Estrogen Cream Instruction Discard applicator Apply pea sized amount to tip of finger to urethra before bed. Wash hands well after application. Use Monday, Wednesday and Friday Started by: Vanna Scotland   fluticasone 50 MCG/ACT nasal spray Commonly known as: FLONASE Place 2 sprays into both nostrils daily.   folic acid 1 MG tablet Commonly known as: FOLVITE Take 1 tablet (1 mg total) by mouth daily.   levothyroxine 75 MCG tablet Commonly known as: SYNTHROID Take 1 tablet (75 mcg total)  by mouth daily.   lisinopril 5 MG tablet Commonly known as: ZESTRIL Take 1 tablet daily   methotrexate 2.5 MG tablet Commonly known as: RHEUMATREX Take by mouth See admin instructions. Take 7 tablets weekly on the same day. Sunday   Simponi Aria 50 MG/4ML Soln injection Generic drug: golimumab 2mg /kg   Vitamin D (Ergocalciferol) 1.25 MG (50000 UNIT) Caps capsule Commonly known as: DRISDOL Take 1 capsule (50,000 Units total) by mouth once a week.        Family History: Family History  Problem Relation Age of Onset   Colon cancer Neg Hx    Breast cancer Neg Hx     Social History:  reports that she has never smoked. She has never used smokeless tobacco. She reports that she does not drink alcohol and does not use drugs.   Physical Exam: BP (!) 158/76   Pulse 87   Ht 5\' 4"  (1.626 m)   Wt 146 lb 4 oz (66.3 kg)   BMI 25.10 kg/m   Constitutional:  Alert and oriented, No acute distress.  Accompanied by her daughter today. HEENT: [place] AT, moist mucus membranes.  Trachea midline, no masses. Neurologic: Grossly intact, no focal deficits, moving all 4 extremities. Psychiatric: Normal mood and affect.  Results for orders placed or performed in visit on 02/27/23  Microscopic Examination   Urine  Result Value Ref Range   WBC, UA 6-10 (A) 0 - 5 /hpf   RBC, Urine 0-2 0 - 2 /hpf   Epithelial Cells (non renal) 0-10 0 - 10 /hpf   Bacteria, UA Few None seen/Few  Urinalysis, Complete  Result Value Ref Range   Specific Gravity, UA 1.015 1.005 - 1.030   pH, UA 7.0 5.0 - 7.5   Color, UA Yellow Yellow   Appearance Ur Clear Clear   Leukocytes,UA 1+ (A) Negative   Protein,UA Negative Negative/Trace   Glucose, UA Negative Negative   Ketones, UA Negative Negative   RBC, UA Negative Negative   Bilirubin, UA Negative Negative   Urobilinogen, Ur 0.2 0.2 - 1.0 mg/dL   Nitrite, UA Negative Negative   Microscopic Examination See below:   BLADDER SCAN AMB NON-IMAGING  Result Value Ref  Range   Scan Result 0 ml      Assessment & Plan:    1. Recurrent UTI - Order renal ultrasound to evaluate for kidney stones or other structural abnormalities especially in light of previous scan results.  Query whether she is obstructing stones as contributing factor to her recurrent back-to-back infections. - Differential diagnoses include persistent bacterial infection, possible kidney stones contributing to infection, and atrophic urethritis. - Urine culture today; she is asymptomatic and her urine appears to be far more bland than on previous occasions.  Suspect this is contaminant.  Recurrent UTI Prevention Strategies  Stay well hydrated. Get a moderate amount of exercise. Eat a diet rich in fruit and  vegetables. Start a bowel regimen to manage your constipation. Your goal is to have consistent, formed bowel movements that are easy for you to pass. You may use either of the over-the-counter supplements Benefiber or Miralax to help with this. I recommend that you try Benefiber first and move on to Miralax if this is not helping you enough. You may adjust the recommended dose of Miralax (one capful daily) to achieve this goal. Start taking an over-the-counter cranberry supplement for urinary tract health. Take this once or twice daily on an empty stomach, e.g. right before bed. Start taking an over-the-counter d-mannose supplement. Take this daily per packaging instructions. Start taking an over-the-counter probiotic containing the bacterial species called Lactobacillus. Take this daily. Start vaginal estrogen cream. Apply a pea-sized amount around the opening of the urethra every day for 2 weeks, then three times weekly forever.  2. Vulvar lesion - Recommend consultation with a dermatologist or gynecologist for further evaluation and management.  Call with [facility] [address], [address] [place], [place] [address] [phone]

## 2023-02-27 NOTE — Patient Instructions (Signed)
For UTI prevention take cranberry tablets, probiotic, D-Mannose daily.  

## 2023-03-02 LAB — CULTURE, URINE COMPREHENSIVE

## 2023-03-25 ENCOUNTER — Ambulatory Visit
Admission: RE | Admit: 2023-03-25 | Discharge: 2023-03-25 | Disposition: A | Discharge status: Home / Self Care | Payer: Medicare Other | Source: Ambulatory Visit | Attending: Urology | Admitting: Urology

## 2023-03-25 DIAGNOSIS — N390 Urinary tract infection, site not specified: Secondary | ICD-10-CM | POA: Insufficient documentation

## 2023-03-25 DIAGNOSIS — N132 Hydronephrosis with renal and ureteral calculous obstruction: Secondary | ICD-10-CM | POA: Diagnosis not present

## 2023-04-04 ENCOUNTER — Encounter: Payer: Self-pay | Admitting: Urology

## 2023-04-04 DIAGNOSIS — R1084 Generalized abdominal pain: Secondary | ICD-10-CM

## 2023-04-09 NOTE — Telephone Encounter (Signed)
 Appointment made

## 2023-04-17 DIAGNOSIS — Z79899 Other long term (current) drug therapy: Secondary | ICD-10-CM | POA: Diagnosis not present

## 2023-04-17 DIAGNOSIS — M0609 Rheumatoid arthritis without rheumatoid factor, multiple sites: Secondary | ICD-10-CM | POA: Diagnosis not present

## 2023-04-26 ENCOUNTER — Ambulatory Visit
Admission: RE | Admit: 2023-04-26 | Discharge: 2023-04-26 | Disposition: A | Discharge status: Home / Self Care | Payer: Medicare Other | Source: Ambulatory Visit | Attending: Urology

## 2023-04-26 DIAGNOSIS — R109 Unspecified abdominal pain: Secondary | ICD-10-CM | POA: Diagnosis not present

## 2023-04-26 DIAGNOSIS — R1084 Generalized abdominal pain: Secondary | ICD-10-CM | POA: Diagnosis not present

## 2023-04-26 DIAGNOSIS — K5730 Diverticulosis of large intestine without perforation or abscess without bleeding: Secondary | ICD-10-CM | POA: Diagnosis not present

## 2023-04-26 DIAGNOSIS — K449 Diaphragmatic hernia without obstruction or gangrene: Secondary | ICD-10-CM | POA: Diagnosis not present

## 2023-04-26 DIAGNOSIS — N200 Calculus of kidney: Secondary | ICD-10-CM | POA: Diagnosis not present

## 2023-05-15 ENCOUNTER — Ambulatory Visit: Payer: Medicare Other | Admitting: Urology

## 2023-05-15 VITALS — BP 179/69 | HR 74 | Ht 64.0 in | Wt 146.0 lb

## 2023-05-15 DIAGNOSIS — Z87440 Personal history of urinary (tract) infections: Secondary | ICD-10-CM

## 2023-05-15 DIAGNOSIS — R82998 Other abnormal findings in urine: Secondary | ICD-10-CM | POA: Diagnosis not present

## 2023-05-15 DIAGNOSIS — N390 Urinary tract infection, site not specified: Secondary | ICD-10-CM | POA: Diagnosis not present

## 2023-05-15 DIAGNOSIS — R350 Frequency of micturition: Secondary | ICD-10-CM

## 2023-05-15 DIAGNOSIS — R911 Solitary pulmonary nodule: Secondary | ICD-10-CM

## 2023-05-15 LAB — URINALYSIS, COMPLETE
Bilirubin, UA: NEGATIVE
Glucose, UA: NEGATIVE
Ketones, UA: NEGATIVE
Nitrite, UA: NEGATIVE
Protein,UA: NEGATIVE
Specific Gravity, UA: 1.015 (ref 1.005–1.030)
Urobilinogen, Ur: 0.2 mg/dL (ref 0.2–1.0)
pH, UA: 6 (ref 5.0–7.5)

## 2023-05-15 LAB — MICROSCOPIC EXAMINATION: WBC, UA: >30 /[HPF] — AB (ref 0–5)

## 2023-05-15 MED ORDER — AMOXICILLIN-POT CLAVULANATE 875-125 MG PO TABS
1.0000 | ORAL_TABLET | Freq: Two times a day (BID) | ORAL | 0 refills | Status: DC
Start: 2023-05-15 — End: 2023-08-07

## 2023-05-15 NOTE — Progress Notes (Signed)
Wanda Cohen,acting as a scribe for Wanda Scotland, MD.,have documented all relevant documentation on the behalf of Wanda Scotland, MD,as directed by  Wanda Scotland, MD while in the presence of Wanda Scotland, MD.  05/15/23 3:39 PM   Wanda Cohen November 05, 1934 [id number]  Referring provider: Berniece Salines, FNP [address] [place],  [place] [address]  Chief Complaint  Patient presents with   Results    CT scan     HPI: 89 year-old female with recurrent UTI's who presents today for follow up.   As part of the workup, we did an renal ultrasound in December that showed some mild pelviectasis, along with left hydronephrosis. She follows up today with a CT stone protocol to further assess her renal ultrasound findings.   I am personally reviewing that study today. I do not appreciate any hydronephrosis. She does have a punctate stone in the left kidney but no significant additional stones. Incidentally, she does have a solid nodule of the right lower lobe measuring 1 by 0.6. The radiologist had recommended a 3-6 month scan to confirm its persistence.  She does mention that she is worried that she has a UTI today. She reports experiencing frequent urination over the last couple of days. She denies burning sensation during urination. No fevers or chills reported. She reports that the urine sample appeared cloudy.  Urinalysis today shows >30 WBCs, 3-10 RBCs, and many bacteria, indicating infection.  PMH: Past Medical History:  Diagnosis Date   Colon cancer, ascending (HCC)    hx of   Hx of breast cancer    Hypertension    Hypothyroidism     Surgical History: Past Surgical History:  Procedure Laterality Date   BREAST EXCISIONAL BIOPSY Right    COLONOSCOPY N/A 12/11/2012   Procedure: COLONOSCOPY;  Surgeon: Malissa Hippo, MD;  [facility];  Service: Endoscopy;  Laterality: N/A;  1030   COLONOSCOPY N/A 07/09/2017   Procedure: COLONOSCOPY;   Surgeon: Franky Macho, MD;  [facility];  Service: Gastroenterology;  Laterality: N/A;   MASTECTOMY Left    RIGHT COLECTOMY      Home Medications:  Allergies as of 05/15/2023   No Known Allergies      Medication List        Accurate as of May 15, 2023  3:39 PM. If you have any questions, ask your nurse or doctor.          acetaminophen 325 MG tablet Commonly known as: TYLENOL Take 2 tablets (650 mg total) by mouth every 6 (six) hours. What changed:  how much to take when to take this   amoxicillin-clavulanate 875-125 MG tablet Commonly known as: AUGMENTIN Take 1 tablet by mouth 2 (two) times daily.   CRANBERRY PO Take by mouth daily.   D-MANNOSE PO Take by mouth daily.   estradiol 0.1 MG/GM vaginal cream Commonly known as: ESTRACE Estrogen Cream Instruction Discard applicator Apply pea sized amount to tip of finger to urethra before bed. Wash hands well after application. Use Monday, Wednesday and Friday   fluticasone 50 MCG/ACT nasal spray Commonly known as: FLONASE Place 2 sprays into both nostrils daily.   folic acid 1 MG tablet Commonly known as: FOLVITE Take 1 tablet (1 mg total) by mouth daily.   levothyroxine 75 MCG tablet Commonly known as: SYNTHROID Take 1 tablet (75 mcg total) by mouth daily.   lisinopril 5 MG tablet Commonly known as: ZESTRIL Take 1 tablet daily  methotrexate 2.5 MG tablet Commonly known as: RHEUMATREX Take by mouth See admin instructions. Take 7 tablets weekly on the same day. Sunday   PROBIOTIC DAILY PO Take by mouth daily.   Simponi Aria 50 MG/4ML Soln injection Generic drug: golimumab 2mg /kg   Vitamin D (Ergocalciferol) 1.25 MG (50000 UNIT) Caps capsule Commonly known as: DRISDOL Take 1 capsule (50,000 Units total) by mouth once a week.        Family History: Family History  Problem Relation Age of Onset   Colon cancer Neg Hx    Breast cancer Neg Hx     Social History:  reports that  she has never smoked. She has never used smokeless tobacco. She reports that she does not drink alcohol and does not use drugs.   Physical Exam: BP (!) 179/69 (BP Location: Right Arm, Patient Position: Sitting, Cuff Size: Small)   Pulse 74   Ht 5\' 4"  (1.626 m)   Wt 146 lb (66.2 kg)   BMI 25.06 kg/m   Constitutional:  Alert and oriented, No acute distress. HEENT: [place] AT, moist mucus membranes.  Trachea midline, no masses. Neurologic: Grossly intact, no focal deficits, moving all 4 extremities. Psychiatric: Normal mood and affect.  Pertinent Imaging: EXAM: CT ABDOMEN AND PELVIS WITHOUT CONTRAST  TECHNIQUE: Multidetector CT imaging of the abdomen and pelvis was performed following the standard protocol without IV contrast.  RADIATION DOSE REDUCTION: This exam was performed according to the departmental dose-optimization program which includes automated exposure control, adjustment of the mA and/or kV according to patient size and/or use of iterative reconstruction technique.  COMPARISON:  None Available.  FINDINGS: Lower chest: Sub solid nodule within the posterior subpleural right lower lobe measures 1.0 by 0.6 cm, image 11/4.  Hepatobiliary: No suspicious liver abnormality. Gallbladder appears normal. No bile duct dilatation.  Pancreas: Unremarkable. No pancreatic ductal dilatation or surrounding inflammatory changes.  Spleen: Normal in size without focal abnormality.  Adrenals/Urinary Tract: Normal appearance of the adrenal glands. There is a single punctate stone noted within the interpolar collecting system of the left kidney, image 24/2. No right renal calculi. No hydronephrosis or mass identified bilaterally. The urinary bladder appears normal.  Stomach/Bowel: Moderate size hiatal hernia. Status post right hemicolectomy with enterocolonic anastomosis. No bowel wall thickening, inflammation or distension. Sigmoid diverticulosis without signs of acute  diverticulitis.  Vascular/Lymphatic: Aortic atherosclerosis. No enlarged abdominopelvic lymph nodes.  Reproductive: Uterus and bilateral adnexa are unremarkable.  Other: No ascites or focal fluid collections. No signs of pneumoperitoneum.  Musculoskeletal: Remote healed fracture of the right inferior pubic rami. Mild lumbar scoliosis and multilevel degenerative disc disease. No acute or suspicious osseous findings.  IMPRESSION: 1. No acute findings within the abdomen or pelvis. 2. Nonobstructing left renal calculus. 3. Moderate size hiatal hernia. 4. Sigmoid diverticulosis without signs of acute diverticulitis. 5. Sub solid nodule within the posterior subpleural right lower lobe measures 1.0 by 0.6 cm. Per Fleischner Society Guidelines, recommend a non-contrast Chest CT at 3-6 months to confirm persistence. If unchanged and the solid component remains < 6 mm, an annual non-contrast Chest CT should be performed for 5 years. If the solid component is 6-8 mm on follow-up, recommend biopsy or resection. If the solid component is > 8 mm on follow-up, recommend PET/CT, biopsy or resection. 6.  Aortic Atherosclerosis (ICD10-I70.0).   Electronically Signed By: Signa Kell M.D. On: 05/04/2023 12:54  This was personally reviewed and I agree with the radiologic interpretation.   EXAM: RENAL / URINARY TRACT  ULTRASOUND COMPLETE   COMPARISON:  None Available.   FINDINGS: Right Kidney:   Renal measurements: 9.5 x 3.1 x 4.0 cm = volume: 60.8 ML. Normal renal cortical thickness and echogenicity. Mild pelviectasis. No mass.   Left Kidney:   Renal measurements: 9.8 x 4.2 x 4.1 cm = volume: 87.5 mL. Echogenicity within normal limits. No mass or hydronephrosis visualized. There is a 6 mm stone.   Bladder:   Appears normal for degree of bladder distention.   Other:   None.   IMPRESSION: 1. Mild right pelviectasis. 2. Left nephrolithiasis.     Electronically Signed    By: Annia Belt M.D.   On: 03/26/2023 09:03  This was personally reviewed and I agree with the radiologic interpretation.  Assessment & Plan:    1. Kidney stone - She has no significant stone disease. There is no evidence of hydronephrosis on this study. I do not think the stone is a contributing factor and it is quite small. - I would recommend observation at this time.   2. Recurrent UTI  - Recurrent UTI Prevention Strategies  Stay well hydrated. Get a moderate amount of exercise. Eat a diet rich in fruit and vegetables. Start a bowel regimen to manage your constipation. Your goal is to have consistent, formed bowel movements that are easy for you to pass. You may use either of the over-the-counter supplements Benefiber or Miralax to help with this. I recommend that you try Benefiber first and move on to Miralax if this is not helping you enough. You may adjust the recommended dose of Miralax (one capful daily) to achieve this goal. Start taking an over-the-counter cranberry supplement for urinary tract health. Take this once or twice daily on an empty stomach, e.g. right before bed. Start taking an over-the-counter d-mannose supplement. Take this daily per packaging instructions. Start taking an over-the-counter probiotic containing the bacterial species called Lactobacillus. Take this daily. Start vaginal estrogen cream. Apply a pea-sized amount around the opening of the urethra every day for 2 weeks, then three times weekly forever.  2. Acute cystitis - Urinalysis today shows >30 WBCs, 3-10 RBCs, and many bacteria, indicating infection. - Augmentin BID -Urine culture  3. Pulmonary nodule - Incidental finding on CT - Solid nodule in right lower lobe measuring 1 by 0.6. - Referral to a pulmonary nodule clinic   I have reviewed the above documentation for accuracy and completeness, and I agree with the above.   Wanda Scotland, MD  [facility] [address], [address] [place], [place] [address] [phone]

## 2023-05-17 ENCOUNTER — Other Ambulatory Visit: Payer: Self-pay | Admitting: Nurse Practitioner

## 2023-05-17 DIAGNOSIS — D649 Anemia, unspecified: Secondary | ICD-10-CM

## 2023-05-17 DIAGNOSIS — E039 Hypothyroidism, unspecified: Secondary | ICD-10-CM

## 2023-05-17 DIAGNOSIS — I10 Essential (primary) hypertension: Secondary | ICD-10-CM

## 2023-05-19 LAB — CULTURE, URINE COMPREHENSIVE

## 2023-05-20 NOTE — Telephone Encounter (Signed)
Requested Prescriptions  Refused Prescriptions Disp Refills   folic acid (FOLVITE) 1 MG tablet [Pharmacy Med Name: FOLIC ACID 1 MG TABLET] 90 tablet 1    Sig: TAKE 1 TABLET BY MOUTH EVERY DAY     Endocrinology:  Vitamins Passed - 05/20/2023 11:33 AM      Passed - Valid encounter within last 12 months    Recent Outpatient Visits           3 months ago Recurrent UTI   [facility] Berniece Salines, FNP   4 months ago Urinary tract infection with hematuria, site unspecified   [facility] M[facility]um, Oswaldo Conroy, PA-C   4 months ago Age-related osteoporosis without current pathological fracture   [facility] Berniece Salines, FNP   6 months ago Actinic keratosis   [facility] Berniece Salines, FNP   10 months ago Hypothyroidism, unspecified type   [facility] Berniece Salines, FNP               levothyroxine (SYNTHROID) 75 MCG tablet [Pharmacy Med Name: LEVOTHYROXINE 75 MCG TABLET] 90 tablet 1    Sig: TAKE 1 TABLET BY MOUTH EVERY DAY     Endocrinology:  Hypothyroid Agents Passed - 05/20/2023 11:33 AM      Passed - TSH in normal range and within 360 days    TSH  Date Value Ref Range Status  10/24/2022 2.97 0.40 - 4.50 mIU/L Final         Passed - Valid encounter within last 12 months    Recent Outpatient Visits           3 months ago Recurrent UTI   [facility], FNP   4 months ago Urinary tract infection with hematuria, site unspecified   [facility] Mecum, Oswaldo Conroy, PA-C   4 months ago Age-related osteoporosis without current pathological fracture   [facility] Berniece Salines, FNP   6 months ago Actinic keratosis   [facility] Berniece Salines, FNP   10 months ago Hypothyroidism, unspecified type   [facility] Berniece Salines, FNP               lisinopril (ZESTRIL) 5 MG tablet [Pharmacy Med Name: LISINOPRIL 5 MG TABLET] 90 tablet 1    Sig: TAKE 1 TABLET BY MOUTH EVERY DAY     Cardiovascular:  ACE Inhibitors Failed - 05/20/2023 11:33 AM      Failed - Cr in normal range and within 180 days    Creat  Date Value Ref Range Status  01/01/2022 1.02 (H) 0.60 - 0.95 mg/dL Final         Failed - K in normal range and within 180 days    Potassium  Date Value Ref Range Status  01/01/2022 4.5 3.5 - 5.3 mmol/L Final         Failed - Last BP in normal range    BP Readings from Last 1 Encounters:  05/15/23 (!) 179/69         Passed - Patient is not pregnant      Passed - Valid encounter within last 6 months    Recent Outpatient Visits           3 months ago Recurrent UTI   [facility] [place],  Rudolpho Sevin, FNP   4 months ago Urinary tract infection with hematuria, site unspecified   [facility] Mecum, Oswaldo Conroy, PA-C   4 months ago Age-related osteoporosis without current pathological fracture   [facility] Berniece Salines, FNP   6 months ago Actinic keratosis   [facility] Berniece Salines, FNP   10 months ago Hypothyroidism, unspecified type   [facility] Berniece Salines, [place]

## 2023-06-04 ENCOUNTER — Ambulatory Visit: Payer: Medicare Other | Admitting: Pulmonary Disease

## 2023-06-04 ENCOUNTER — Encounter: Payer: Self-pay | Admitting: Pulmonary Disease

## 2023-06-04 VITALS — BP 128/80 | HR 105 | Temp 96.9°F | Ht 64.0 in | Wt 146.4 lb

## 2023-06-04 DIAGNOSIS — H6593 Unspecified nonsuppurative otitis media, bilateral: Secondary | ICD-10-CM

## 2023-06-04 DIAGNOSIS — Z85038 Personal history of other malignant neoplasm of large intestine: Secondary | ICD-10-CM

## 2023-06-04 DIAGNOSIS — K449 Diaphragmatic hernia without obstruction or gangrene: Secondary | ICD-10-CM

## 2023-06-04 DIAGNOSIS — R911 Solitary pulmonary nodule: Secondary | ICD-10-CM

## 2023-06-04 DIAGNOSIS — K219 Gastro-esophageal reflux disease without esophagitis: Secondary | ICD-10-CM

## 2023-06-04 DIAGNOSIS — Z853 Personal history of malignant neoplasm of breast: Secondary | ICD-10-CM | POA: Diagnosis not present

## 2023-06-04 DIAGNOSIS — M069 Rheumatoid arthritis, unspecified: Secondary | ICD-10-CM | POA: Diagnosis not present

## 2023-06-04 NOTE — Progress Notes (Signed)
 Subjective:    Patient ID: Wanda Cohen, female    DOB: March 17, 1935, 89 y.o.   MRN: [id number]  Patient Care Team: Berniece Salines, FNP as PCP - General (Nurse Practitioner)  Chief Complaint  Patient presents with   Consult    SOB. Wheezing. Little cough.     BACKGROUND: Wanda Cohen is an 89 year old lifelong never smoker with a history of rheumatoid arthritis and recurrent UTIs, who presents for evaluation of a lung nodule noted incidentally on a CT renal stone study.  She is kindly referred by Dr. Vanna Scotland.  HPI Discussed the use of AI scribe software for clinical note transcription with the patient, who gave verbal consent to proceed.  History of Present Illness   Wanda Cohen is an 89 year old female with rheumatoid arthritis and a history as noted below, who presents for evaluation of a lung nodule.  She presents today with her daughter, [name].  A lung nodule was identified during a CT scan performed approximately one month ago, initially conducted to assess for kidney stones after recurrent urinary tract infections and a past incident of a bladder stone noted two years ago. The nodule is located at the base of the right lung and is subsolid in nature. She is concerned about the lung nodule and has a history of rheumatoid arthritis, which may contribute to inflammatory changes in the lung. She experiences a chronic cough but denies fever.  She has a significant hiatal hernia identified during the CT scan. She experiences frequent belching, which she describes as 'embarrassing' and 'loud', and has a history of vomiting after consuming coffee. She reports a decreased appetite, stating she is 'not hungry a lot anymore'.  She reports ear congestion, particularly in the right ear, which feels 'stuffed up' and like 'being in a hole'. She occasionally uses nasal sprays and has Claritin available but does not take it regularly. She describes a sensation of fullness in the  ear, which she believes would improve if it 'would pop'.  The symptoms have been ongoing for the last 2 weeks.  She presents today with her daughter who works at the [employer] cancer center.  I reviewed the films with the patient and her daughter.    DATA 04/26/2023 CT abdomen and pelvis without contrast: Nonobstructing left renal calculus, moderate size hiatal hernia, sigmoid diverticulosis without diverticulitis.  Subsolid nodule in the posterior subpleural right lower lobe measuring 1.0 x 0.6 cm with no distinct solid component.  Review of Systems A 10 point review of systems was performed and it is as noted above otherwise negative.   Past Medical History:  Diagnosis Date   Colon cancer, ascending (HCC)    hx of   Hx of breast cancer    Hypertension    Hypothyroidism     Past Surgical History:  Procedure Laterality Date   BREAST EXCISIONAL BIOPSY Right    COLONOSCOPY N/A 12/11/2012   Procedure: COLONOSCOPY;  Surgeon: Malissa Hippo, MD;  [facility];  Service: Endoscopy;  Laterality: N/A;  1030   COLONOSCOPY N/A 07/09/2017   Procedure: COLONOSCOPY;  Surgeon: Franky Macho, MD;  Location: [facility];  Service: Gastroenterology;  Laterality: N/A;   MASTECTOMY Left    RIGHT COLECTOMY      Patient Active Problem List   Diagnosis Date Noted   Personal history of breast cancer 01/01/2022   Vitamin D deficiency 06/17/2021   Hypothyroidism    Pelvis fracture (HCC) 06/15/2021   E Coli  UTI (urinary tract infection) 06/15/2021   Rheumatoid arthritis (HCC) 06/15/2021   Personal history of colon cancer    Diverticulosis of large intestine without diverticulitis    Body mass index 25.0-25.9, adult 07/18/2016   Chronic kidney disease, stage II (mild) 05/01/2016   Benign essential hypertension 04/21/2013   Pure hypercholesterolemia 10/07/2012    Family History  Problem Relation Age of Onset   Colon cancer Neg Hx    Breast cancer Neg Hx     Social History   Tobacco  Use   Smoking status: Never   Smokeless tobacco: Never  Substance Use Topics   Alcohol use: No    No Known Allergies  Current Meds  Medication Sig   acetaminophen (TYLENOL) 325 MG tablet Take 2 tablets (650 mg total) by mouth every 6 (six) hours. (Patient taking differently: Take 325 mg by mouth daily.)   CRANBERRY PO Take by mouth daily.   D-MANNOSE PO Take by mouth daily.   estradiol (ESTRACE) 0.1 MG/GM vaginal cream Estrogen Cream Instruction Discard applicator Apply pea sized amount to tip of finger to urethra before bed. Wash hands well after application. Use Monday, Wednesday and Friday   fluticasone (FLONASE) 50 MCG/ACT nasal spray Place 2 sprays into both nostrils daily.   folic acid (FOLVITE) 1 MG tablet Take 1 tablet (1 mg total) by mouth daily.   golimumab (SIMPONI ARIA) 50 MG/4ML SOLN injection 2mg /kg   levothyroxine (SYNTHROID) 75 MCG tablet Take 1 tablet (75 mcg total) by mouth daily.   lisinopril (ZESTRIL) 5 MG tablet Take 1 tablet daily   methotrexate 2.5 MG tablet Take by mouth See admin instructions. Take 7 tablets weekly on the same day. Sunday   Probiotic Product (PROBIOTIC DAILY PO) Take by mouth daily.   Vitamin D, Ergocalciferol, (DRISDOL) 1.25 MG (50000 UNIT) CAPS capsule Take 1 capsule (50,000 Units total) by mouth once a week.    Immunization History  Administered Date(s) Administered   Influenza,inj,Quad PF,6-35 Mos 12/31/2022   Influenza-Unspecified 12/31/2012, 12/31/2017, 01/01/2023        Objective:     BP 128/80 (BP Location: Right Arm, Cuff Size: Normal)   Pulse (!) 105   Temp (!) 96.9 F (36.1 C)   Ht 5\' 4"  (1.626 m)   Wt 146 lb 6.4 oz (66.4 kg)   SpO2 96%   BMI 25.13 kg/m   SpO2: 96 % O2 Device: None (Room air)  GENERAL: Well-developed well-nourished elderly woman, age-appropriate, no acute distress.  Uses walker for assistance in ambulation.  No conversational dyspnea. HEAD: Normocephalic, atraumatic.  EARS: Serous otitis noted  bilaterally. EYES: Pupils equal, round, reactive to light.  No scleral icterus.  MOUTH: Dentition intact, oral mucosa moist.  No thrush. NECK: Supple. No thyromegaly. Trachea midline. No JVD.  No adenopathy. PULMONARY: Good air entry bilaterally.  No adventitious sounds. CARDIOVASCULAR: S1 and S2. Regular rate and rhythm.  ABDOMEN: Benign. MUSCULOSKELETAL: Mild RA changes in hands bilaterally, no clubbing, no edema.  NEUROLOGIC: No overt focal deficit, unsteady gait requires assistance, speech is fluent. SKIN: Intact,warm,dry. PSYCH: Mood and behavior normal   Representative image from the CT performed 26 April 2023, showing the subsolid nodule in question (arrow):   Assessment & Plan:     ICD-10-CM   1. Lung nodule seen on imaging study  R91.1 CT CHEST WO CONTRAST    2. Hiatal hernia with gastroesophageal reflux  K44.9    K21.9     3. Bilateral serous otitis media, unspecified chronicity  H65.93  4. Rheumatoid arthritis involving multiple joints (HCC)  M06.9     5. Personal history of colon cancer  Z85.038     6. Personal history of breast cancer  Z85.3      Orders Placed This Encounter  Procedures   CT CHEST WO CONTRAST    Standing Status:   Future    Expected Date:   09/04/2023    Expiration Date:   06/03/2024    Preferred imaging location?:   [facility]   Succussion:    Lung nodule A small nodule was identified at the base of the right lung on a renal stone study CT scan. It is suspected to be benign, possibly due to inflammation or infection related to rheumatoid arthritis or reflux from a significant hiatal hernia. There is no immediate concern for malignancy. - Order a dedicated chest CT scan in 2-3 months without contrast to further evaluate the lung nodule.  Rheumatoid arthritis Rheumatoid arthritis may contribute to inflammatory changes in the lung, potentially explaining the lung nodule.  Hiatal hernia A significant hiatal hernia with gastric  protrusion into the chest may contribute to reflux symptoms and belching. She experiences episodes of belching and decreased appetite, which may be related to the hernia. Reflux from the hernia could also contribute to the lung nodule. - Advise smaller, more frequent meals to manage symptoms. - Consider referral to a gastroenterologist if symptoms persist.  Serous otitis media Fluid is present behind the tympanic membrane, likely due to Eustachian tube dysfunction or allergies, causing a sensation of fullness in the ear. The condition is non-infectious. - Recommend Nasonex nasal spray, one spray in each nostril twice daily. - Advise daily use of Claritin. - Consider ENT referral if symptoms do not improve.  Follow-up Follow-up plans for further evaluation and management of the lung nodule and other conditions. - Schedule follow-up appointment after the chest CT scan is completed.      Advised if symptoms do not improve or worsen, to please contact office for sooner follow up or seek emergency care.    I spent 45 minutes of dedicated to the care of this patient on the date of this encounter to include pre-visit review of records, face-to-face time with the patient discussing conditions above, post visit ordering of testing, clinical documentation with the electronic health record, making appropriate referrals as documented, and communicating necessary findings to members of the patients care team.   C. Danice Goltz, MD Advanced Bronchoscopy P[facility]    *This note was dictated using voice recognition software/Dragon.  Despite best efforts to proofread, errors can occur which can change the meaning. Any transcriptional errors that result from this process are unintentional and may not be fully corrected at the time of dictation.

## 2023-06-04 NOTE — Patient Instructions (Addendum)
 VISIT SUMMARY:  During today's visit, we discussed the evaluation of a lung nodule identified in a recent CT scan, as well as your ongoing issues with rheumatoid arthritis, hiatal hernia, and ear congestion. We reviewed your symptoms and developed a plan to address each concern.  YOUR PLAN:  -LUNG NODULE: A small nodule was found at the base of your right lung. It is likely benign and could be due to inflammation or infection related to your rheumatoid arthritis or reflux from your hiatal hernia. We will do another chest CT scan in 2-3 months to check on it further.  -RHEUMATOID ARTHRITIS: Rheumatoid arthritis is an autoimmune condition that causes inflammation in your joints and can also affect your lungs. This might be contributing to the lung nodule we found.  -HIATAL HERNIA: A hiatal hernia occurs when part of your stomach pushes up into your chest through an opening in your diaphragm. This can cause symptoms like belching and decreased appetite. To help manage these symptoms, try eating smaller, more frequent meals. If the symptoms continue, we may refer you to a gastroenterologist.  -SEROUS OTITIS MEDIA: Serous otitis media is a condition where fluid builds up behind your eardrum, often due to Eustachian tube dysfunction or allergies. This can cause a feeling of fullness in your ear. We recommend using Nasonex nasal spray twice daily and taking Claritin daily. Nasonex is one spray to each nostril twice a day. If your symptoms do not improve, we may refer you to an ENT specialist.  INSTRUCTIONS:  Please schedule a follow-up appointment after you complete the chest CT scan in 2-3 months. Continue with the recommended treatments and let us know if your symptoms persist or worsen.

## 2023-06-08 ENCOUNTER — Encounter: Payer: Self-pay | Admitting: Pulmonary Disease

## 2023-06-09 ENCOUNTER — Encounter: Payer: Self-pay | Admitting: Nurse Practitioner

## 2023-06-10 ENCOUNTER — Other Ambulatory Visit: Payer: Self-pay | Admitting: Nurse Practitioner

## 2023-06-10 DIAGNOSIS — K219 Gastro-esophageal reflux disease without esophagitis: Secondary | ICD-10-CM

## 2023-06-10 MED ORDER — OMEPRAZOLE 20 MG PO CPDR: 20.0000 mg | DELAYED_RELEASE_CAPSULE | Freq: Every day | ORAL | 3 refills | Status: DC

## 2023-06-12 DIAGNOSIS — Z79899 Other long term (current) drug therapy: Secondary | ICD-10-CM | POA: Diagnosis not present

## 2023-06-12 DIAGNOSIS — M0609 Rheumatoid arthritis without rheumatoid factor, multiple sites: Secondary | ICD-10-CM | POA: Diagnosis not present

## 2023-07-15 DIAGNOSIS — Z79899 Other long term (current) drug therapy: Secondary | ICD-10-CM | POA: Diagnosis not present

## 2023-07-15 DIAGNOSIS — M79642 Pain in left hand: Secondary | ICD-10-CM | POA: Diagnosis not present

## 2023-07-15 DIAGNOSIS — D649 Anemia, unspecified: Secondary | ICD-10-CM | POA: Diagnosis not present

## 2023-07-15 DIAGNOSIS — R5382 Chronic fatigue, unspecified: Secondary | ICD-10-CM | POA: Diagnosis not present

## 2023-07-15 DIAGNOSIS — M0609 Rheumatoid arthritis without rheumatoid factor, multiple sites: Secondary | ICD-10-CM | POA: Diagnosis not present

## 2023-07-15 DIAGNOSIS — E663 Overweight: Secondary | ICD-10-CM | POA: Diagnosis not present

## 2023-07-15 DIAGNOSIS — N1832 Chronic kidney disease, stage 3b: Secondary | ICD-10-CM | POA: Diagnosis not present

## 2023-07-15 DIAGNOSIS — M79641 Pain in right hand: Secondary | ICD-10-CM | POA: Diagnosis not present

## 2023-07-15 DIAGNOSIS — Z6825 Body mass index (BMI) 25.0-25.9, adult: Secondary | ICD-10-CM | POA: Diagnosis not present

## 2023-07-20 ENCOUNTER — Encounter: Payer: Self-pay | Admitting: Nurse Practitioner

## 2023-07-20 DIAGNOSIS — E039 Hypothyroidism, unspecified: Secondary | ICD-10-CM

## 2023-07-20 DIAGNOSIS — I10 Essential (primary) hypertension: Secondary | ICD-10-CM

## 2023-07-20 DIAGNOSIS — D649 Anemia, unspecified: Secondary | ICD-10-CM

## 2023-07-22 ENCOUNTER — Other Ambulatory Visit: Payer: Self-pay | Admitting: Nurse Practitioner

## 2023-07-22 DIAGNOSIS — I10 Essential (primary) hypertension: Secondary | ICD-10-CM

## 2023-07-22 DIAGNOSIS — D649 Anemia, unspecified: Secondary | ICD-10-CM

## 2023-07-22 MED ORDER — LISINOPRIL 5 MG PO TABS
ORAL_TABLET | ORAL | 0 refills | Status: DC
Start: 2023-07-22 — End: 2023-08-07

## 2023-07-22 MED ORDER — LEVOTHYROXINE SODIUM 75 MCG PO TABS: 75.0000 ug | ORAL_TABLET | Freq: Every day | ORAL | 0 refills | Status: DC

## 2023-07-22 MED ORDER — FOLIC ACID 1 MG PO TABS: 1.0000 mg | ORAL_TABLET | Freq: Every day | ORAL | 0 refills | Status: DC

## 2023-07-23 NOTE — Telephone Encounter (Signed)
 Duplicate request, signed 07/22/23 for 30 days.  Requested Prescriptions  Pending Prescriptions Disp Refills   lisinopril  (ZESTRIL ) 5 MG tablet [Pharmacy Med Name: LISINOPRIL  5 MG TABLET] 90 tablet 1    Sig: TAKE 1 TABLET BY MOUTH EVERY DAY     Cardiovascular:  ACE Inhibitors Failed - 07/23/2023  8:44 AM      Failed - Cr in normal range and within 180 days    Creat  Date Value Ref Range Status  01/01/2022 1.02 (H) 0.60 - 0.95 mg/dL Final         Failed - K in normal range and within 180 days    Potassium  Date Value Ref Range Status  01/01/2022 4.5 3.5 - 5.3 mmol/L Final         Failed - Valid encounter within last 6 months    Recent Outpatient Visits   None            Passed - Patient is not pregnant      Passed - Last BP in normal range    BP Readings from Last 1 Encounters:  06/04/23 128/80          folic acid  (FOLVITE ) 1 MG tablet [Pharmacy Med Name: FOLIC ACID  1 MG TABLET] 90 tablet 1    Sig: TAKE 1 TABLET BY MOUTH EVERY DAY     Endocrinology:  Vitamins Failed - 07/23/2023  8:44 AM      Failed - Valid encounter within last 12 months    Recent Outpatient Visits   None

## 2023-07-30 ENCOUNTER — Encounter: Payer: Self-pay | Admitting: Nurse Practitioner

## 2023-08-05 ENCOUNTER — Ambulatory Visit
Admission: RE | Admit: 2023-08-05 | Discharge: 2023-08-05 | Disposition: A | Discharge status: Home / Self Care | Source: Ambulatory Visit | Attending: Nurse Practitioner | Admitting: Nurse Practitioner

## 2023-08-05 DIAGNOSIS — R911 Solitary pulmonary nodule: Secondary | ICD-10-CM | POA: Insufficient documentation

## 2023-08-05 DIAGNOSIS — J984 Other disorders of lung: Secondary | ICD-10-CM | POA: Diagnosis not present

## 2023-08-05 DIAGNOSIS — I700 Atherosclerosis of aorta: Secondary | ICD-10-CM | POA: Diagnosis not present

## 2023-08-07 ENCOUNTER — Ambulatory Visit (INDEPENDENT_AMBULATORY_CARE_PROVIDER_SITE_OTHER): Admitting: Nurse Practitioner

## 2023-08-07 ENCOUNTER — Encounter: Payer: Self-pay | Admitting: Nurse Practitioner

## 2023-08-07 VITALS — BP 116/68 | HR 82 | Temp 98.1°F | Resp 16 | Ht 64.0 in | Wt 145.5 lb

## 2023-08-07 DIAGNOSIS — K219 Gastro-esophageal reflux disease without esophagitis: Secondary | ICD-10-CM | POA: Diagnosis not present

## 2023-08-07 DIAGNOSIS — D649 Anemia, unspecified: Secondary | ICD-10-CM | POA: Diagnosis not present

## 2023-08-07 DIAGNOSIS — E7800 Pure hypercholesterolemia, unspecified: Secondary | ICD-10-CM

## 2023-08-07 DIAGNOSIS — E039 Hypothyroidism, unspecified: Secondary | ICD-10-CM

## 2023-08-07 DIAGNOSIS — K449 Diaphragmatic hernia without obstruction or gangrene: Secondary | ICD-10-CM | POA: Diagnosis not present

## 2023-08-07 DIAGNOSIS — N182 Chronic kidney disease, stage 2 (mild): Secondary | ICD-10-CM

## 2023-08-07 DIAGNOSIS — I10 Essential (primary) hypertension: Secondary | ICD-10-CM

## 2023-08-07 DIAGNOSIS — Z79899 Other long term (current) drug therapy: Secondary | ICD-10-CM | POA: Diagnosis not present

## 2023-08-07 DIAGNOSIS — J321 Chronic frontal sinusitis: Secondary | ICD-10-CM | POA: Insufficient documentation

## 2023-08-07 DIAGNOSIS — M810 Age-related osteoporosis without current pathological fracture: Secondary | ICD-10-CM | POA: Diagnosis not present

## 2023-08-07 DIAGNOSIS — H6993 Unspecified Eustachian tube disorder, bilateral: Secondary | ICD-10-CM | POA: Insufficient documentation

## 2023-08-07 DIAGNOSIS — E559 Vitamin D deficiency, unspecified: Secondary | ICD-10-CM | POA: Diagnosis not present

## 2023-08-07 DIAGNOSIS — M069 Rheumatoid arthritis, unspecified: Secondary | ICD-10-CM | POA: Diagnosis not present

## 2023-08-07 DIAGNOSIS — M0609 Rheumatoid arthritis without rheumatoid factor, multiple sites: Secondary | ICD-10-CM | POA: Diagnosis not present

## 2023-08-07 MED ORDER — OMEPRAZOLE 40 MG PO CPDR: 40.0000 mg | DELAYED_RELEASE_CAPSULE | Freq: Every day | ORAL | 1 refills | Status: AC

## 2023-08-07 MED ORDER — LEVOTHYROXINE SODIUM 75 MCG PO TABS: 75.0000 ug | ORAL_TABLET | Freq: Every day | ORAL | 1 refills | Status: AC

## 2023-08-07 MED ORDER — FOLIC ACID 1 MG PO TABS: 1.0000 mg | ORAL_TABLET | Freq: Every day | ORAL | 1 refills | Status: AC

## 2023-08-07 MED ORDER — LISINOPRIL 5 MG PO TABS: ORAL_TABLET | ORAL | 1 refills | Status: AC

## 2023-08-07 MED ORDER — VITAMIN D (ERGOCALCIFEROL) 1.25 MG (50000 UNIT) PO CAPS
50000.0000 [IU] | ORAL_CAPSULE | ORAL | 3 refills | Status: AC
Start: 2023-08-07 — End: ?

## 2023-08-07 NOTE — Progress Notes (Signed)
 BP 116/68   Pulse 82   Temp 98.1 F (36.7 C)   Resp 16   Ht 5\' 4"  (1.626 m)   Wt 145 lb 8 oz (66 kg)   SpO2 97%   BMI 24.98 kg/m    Subjective:    Patient ID: Wanda Cohen, female    DOB: 05-14-1934, 89 y.o.   MRN: [id number]  HPI: Wanda Cohen is a 89 y.o. female  Chief Complaint  Patient presents with   Medical Management of Chronic Issues    Discussed the use of AI scribe software for clinical note transcription with the patient, who gave verbal consent to proceed.  History of Present Illness Wanda Cohen is an 89 year old female with chronic sinusitis who presents for routine follow up.   She has been experiencing fluid in her ears for over a month, associated with a loss of voice and phlegm production, particularly in the mornings. There is no pain or hearing loss. She has been taking Claritin for allergies and has used Nasonex sporadically. She has not been consistently using Mucinex or nasal spray.  She has a history of bladder infections and underwent a CT scan which revealed a lung nodule. A follow-up chest CT was performed recently, but results are pending. She is coughing up dark yellow phlegm and is concerned about the lung nodule. She has a follow-up with a pulmonologist scheduled.  She experienced back pain about two weeks ago, which she attributes to coughing. The pain started on the left side and moved to the right but has since improved. She has been using ibuprofen, Tylenol , and a heating pad for relief.  She has a history of a bladder stone and chronic kidney disease. A recent urology visit did not reveal any bladder stones, but a kidney stone was noted. She reports a weak urinary stream but denies current symptoms of a bladder infection.  She experiences swelling in her left ankle, which is not as severe today. She has tried compression socks but has not worn them consistently due to difficulty putting them on and taking them off.  She reports  acid reflux symptoms despite taking omeprazole  20 mg daily. She occasionally uses Tums for relief. She takes her medications, including levothyroxine , lisinopril , and methotrexate , in the morning.  She has a history of skin breakdown under her abdominal fold, which she treated with hydrocortisone cream. The area itched but did not become red or infected.         08/07/2023    2:34 PM 01/31/2023    1:20 PM 01/14/2023    2:27 PM  Depression screen PHQ 2/9  Decreased Interest 0 0 0  Down, Depressed, Hopeless 0 0 0  PHQ - 2 Score 0 0 0  Altered sleeping 0  0  Tired, decreased energy 0  0  Change in appetite 0  0  Feeling bad or failure about yourself  0  0  Trouble concentrating 0  0  Moving slowly or fidgety/restless 0  0  Suicidal thoughts 0  0  PHQ-9 Score 0  0  Difficult doing work/chores Not difficult at all  Not difficult at all    Relevant past medical, surgical, family and social history reviewed and updated as indicated. Interim medical history since our last visit reviewed. Allergies and medications reviewed and updated.  Review of Systems  Constitutional: Negative for fever or weight change.  Respiratory: Negative for cough and shortness of breath.   Cardiovascular: Negative for chest pain  or palpitations.  Gastrointestinal: Negative for abdominal pain, no bowel changes.  Musculoskeletal: Negative for gait problem or joint swelling.  Skin: Negative for rash.  Neurological: Negative for dizziness or headache.  No other specific complaints in a complete review of systems (except as listed in HPI above).      Objective:    BP 116/68   Pulse 82   Temp 98.1 F (36.7 C)   Resp 16   Ht 5\' 4"  (1.626 m)   Wt 145 lb 8 oz (66 kg)   SpO2 97%   BMI 24.98 kg/m    Wt Readings from Last 3 Encounters:  08/07/23 145 lb 8 oz (66 kg)  06/04/23 146 lb 6.4 oz (66.4 kg)  05/15/23 146 lb (66.2 kg)    Physical Exam Physical Exam VITALS: BP- 116/60 GENERAL: Alert,  cooperative, well developed, no acute distress. HEENT: Normocephalic, normal oropharynx, moist mucous membranes, fluid behind both ears. CHEST: Clear to auscultation bilaterally, no wheezes, rhonchi, or crackles. CARDIOVASCULAR: Normal heart rate and rhythm, S1 and S2 normal without murmurs. ABDOMEN: Soft, non-tender, non-distended, without organomegaly, normal bowel sounds. EXTREMITIES: No cyanosis or edema. NEUROLOGICAL: Cranial nerves grossly intact, moves all extremities without gross motor or sensory deficit. SKIN:  spot on back, no concerning features.   Results for orders placed or performed in visit on 05/15/23  Microscopic Examination   Collection Time: 05/15/23  2:39 PM   Urine  Result Value Ref Range   WBC, UA >30 (A) 0 - 5 /hpf   RBC, Urine 3-10 (A) 0 - 2 /hpf   Epithelial Cells (non renal) 0-10 0 - 10 /hpf   Bacteria, UA Many (A) None seen/Few  Urinalysis, Complete   Collection Time: 05/15/23  2:39 PM  Result Value Ref Range   Specific Gravity, UA 1.015 1.005 - 1.030   pH, UA 6.0 5.0 - 7.5   Color, UA Yellow Yellow   Appearance Ur Cloudy (A) Clear   Leukocytes,UA 3+ (A) Negative   Protein,UA Negative Negative/Trace   Glucose, UA Negative Negative   Ketones, UA Negative Negative   RBC, UA Trace (A) Negative   Bilirubin, UA Negative Negative   Urobilinogen, Ur 0.2 0.2 - 1.0 mg/dL   Nitrite, UA Negative Negative   Microscopic Examination See below:   CULTURE, URINE COMPREHENSIVE   Collection Time: 05/15/23  4:10 PM   Specimen: Urine   UR  Result Value Ref Range   Urine Culture, Comprehensive Final report (A)    Organism ID, Bacteria Escherichia coli (A)    ANTIMICROBIAL SUSCEPTIBILITY Comment        Assessment & Plan:   Problem List Items Addressed This Visit       Cardiovascular and Mediastinum   Benign essential hypertension - Primary   Relevant Medications   lisinopril  (ZESTRIL ) 5 MG tablet   Other Relevant Orders   CBC with Differential/Platelet    Comprehensive metabolic panel with GFR     Respiratory   Hiatal hernia   Chronic frontal sinusitis   Relevant Orders   Ambulatory referral to ENT     Digestive   Gastroesophageal reflux disease without esophagitis   Relevant Medications   omeprazole  (PRILOSEC) 40 MG capsule     Endocrine   Hypothyroidism (Chronic)   Relevant Medications   levothyroxine  (SYNTHROID ) 75 MCG tablet   Other Relevant Orders   TSH     Nervous and Auditory   Eustachian tube dysfunction, bilateral   Relevant Orders   Ambulatory referral to  ENT     Musculoskeletal and Integument   Rheumatoid arthritis (HCC)   Age-related osteoporosis without current pathological fracture   Relevant Medications   Vitamin D , Ergocalciferol , (DRISDOL ) 1.25 MG (50000 UNIT) CAPS capsule   Other Relevant Orders   VITAMIN D  25 Hydroxy (Vit-D Deficiency, Fractures)     Genitourinary   Chronic kidney disease, stage II (mild)   Relevant Orders   Comprehensive metabolic panel with GFR     Other   Vitamin D  deficiency   Relevant Medications   Vitamin D , Ergocalciferol , (DRISDOL ) 1.25 MG (50000 UNIT) CAPS capsule   Other Relevant Orders   VITAMIN D  25 Hydroxy (Vit-D Deficiency, Fractures)   Pure hypercholesterolemia   Relevant Medications   lisinopril  (ZESTRIL ) 5 MG tablet   Other Relevant Orders   Lipid panel   Other Visit Diagnoses       Anemia, unspecified type       iron level ordered   Relevant Medications   folic acid  (FOLVITE ) 1 MG tablet        Assessment and Plan Assessment & Plan Hypertension Blood pressure is well-controlled with a recent reading of 116/60 mmHg. - Continue lisinopril  5 mg daily.  Chronic kidney disease Chronic kidney disease requires monitoring of kidney function. - Order GFR test.  Hypothyroidism Hypothyroidism is well-managed with current treatment. Last TSH was within normal range. - Continue levothyroxine  75 mcg daily. - Check TSH levels.  Rheumatoid  arthritis Rheumatoid arthritis is managed with methotrexate . No new symptoms reported. - Continue methotrexate  2.5 mg daily.  Hld -getting lipid panel  Osteoporosis/vitamin d  deficiency Osteoporosis is managed with vitamin D  and calcium  supplementation. - Continue vitamin D  50,000 units once a week. - Continue over-the-counter calcium  supplementation.  Chronic sinusitis Chronic sinusitis with fluid behind both ears and phlegm production. Symptoms include morning voice loss and dark yellow phlegm. Current treatment includes Claritin and Nasonex, but not used consistently. ENT referral is advised if symptoms do not improve. - Use Nasonex nasal spray twice daily. - Take Claritin daily. - Add plain Mucinex to regimen. - Refer to ENT for further evaluation.  Acid reflux/hiatal hernia Acid reflux symptoms are not well-controlled with current omeprazole  dose. Symptoms include severe burping and acid regurgitation. Increasing the dose may help alleviate symptoms. - Increase omeprazole  to 40 mg daily. - Take two 20 mg omeprazole  tablets until new prescription is filled. - Avoid taking omeprazole  with levothyroxine ; take at bedtime if necessary.  Hx of anemia Refill of folate      Follow up plan: Return in about 6 months (around 02/07/2024) for follow up.

## 2023-08-08 ENCOUNTER — Encounter: Payer: Self-pay | Admitting: Nurse Practitioner

## 2023-08-08 LAB — CBC WITH DIFFERENTIAL/PLATELET
Absolute Lymphocytes: 2160 {cells}/uL (ref 850–3900)
Absolute Monocytes: 540 {cells}/uL (ref 200–950)
Basophils Absolute: 50 {cells}/uL (ref 0–200)
Basophils Relative: 0.7 %
Eosinophils Absolute: 281 {cells}/uL (ref 15–500)
Eosinophils Relative: 3.9 %
HCT: 34.5 % — ABNORMAL LOW (ref 35.0–45.0)
Hemoglobin: 11.1 g/dL — ABNORMAL LOW (ref 11.7–15.5)
MCH: 30.3 pg (ref 27.0–33.0)
MCHC: 32.2 g/dL (ref 32.0–36.0)
MCV: 94.3 fL (ref 80.0–100.0)
MPV: 10.9 fL (ref 7.5–12.5)
Monocytes Relative: 7.5 %
Neutro Abs: 4169 {cells}/uL (ref 1500–7800)
Neutrophils Relative %: 57.9 %
Platelets: 257 10*3/uL (ref 140–400)
RBC: 3.66 10*6/uL — ABNORMAL LOW (ref 3.80–5.10)
RDW: 14.3 % (ref 11.0–15.0)
Total Lymphocyte: 30 %
WBC: 7.2 10*3/uL (ref 3.8–10.8)

## 2023-08-08 LAB — COMPREHENSIVE METABOLIC PANEL WITH GFR
AG Ratio: 1.3 (calc) (ref 1.0–2.5)
ALT: 14 U/L (ref 6–29)
AST: 22 U/L (ref 10–35)
Albumin: 4.4 g/dL (ref 3.6–5.1)
Alkaline phosphatase (APISO): 94 U/L (ref 37–153)
BUN/Creatinine Ratio: 16 (calc) (ref 6–22)
BUN: 17 mg/dL (ref 7–25)
CO2: 31 mmol/L (ref 20–32)
Calcium: 10.5 mg/dL — ABNORMAL HIGH (ref 8.6–10.4)
Chloride: 103 mmol/L (ref 98–110)
Creat: 1.08 mg/dL — ABNORMAL HIGH (ref 0.60–0.95)
Globulin: 3.3 g/dL (ref 1.9–3.7)
Glucose, Bld: 109 mg/dL — ABNORMAL HIGH (ref 65–99)
Potassium: 4.4 mmol/L (ref 3.5–5.3)
Sodium: 142 mmol/L (ref 135–146)
Total Bilirubin: 0.5 mg/dL (ref 0.2–1.2)
Total Protein: 7.7 g/dL (ref 6.1–8.1)
eGFR: 50 mL/min/{1.73_m2} — ABNORMAL LOW (ref >=60)

## 2023-08-08 LAB — LIPID PANEL
Cholesterol: 230 mg/dL — ABNORMAL HIGH (ref <200)
HDL: 54 mg/dL (ref >=50)
LDL Cholesterol (Calc): 136 mg/dL — ABNORMAL HIGH
Non-HDL Cholesterol (Calc): 176 mg/dL — ABNORMAL HIGH (ref <130)
Total CHOL/HDL Ratio: 4.3 (calc) (ref <5.0)
Triglycerides: 262 mg/dL — ABNORMAL HIGH (ref <150)

## 2023-08-08 LAB — TSH: TSH: 2.95 m[IU]/L (ref 0.40–4.50)

## 2023-08-08 LAB — VITAMIN D 25 HYDROXY (VIT D DEFICIENCY, FRACTURES): Vit D, 25-Hydroxy: 83 ng/mL (ref 30–100)

## 2023-08-27 ENCOUNTER — Ambulatory Visit
Admission: EM | Admit: 2023-08-27 | Discharge: 2023-08-27 | Disposition: A | Discharge status: Home / Self Care | Attending: Emergency Medicine | Admitting: Emergency Medicine

## 2023-08-27 DIAGNOSIS — R9349 Abnormal radiologic findings on diagnostic imaging of other urinary organs: Secondary | ICD-10-CM | POA: Diagnosis not present

## 2023-08-27 DIAGNOSIS — R1084 Generalized abdominal pain: Secondary | ICD-10-CM | POA: Diagnosis not present

## 2023-08-27 DIAGNOSIS — K5900 Constipation, unspecified: Secondary | ICD-10-CM | POA: Diagnosis not present

## 2023-08-27 DIAGNOSIS — N179 Acute kidney failure, unspecified: Secondary | ICD-10-CM | POA: Diagnosis not present

## 2023-08-27 DIAGNOSIS — M545 Low back pain, unspecified: Secondary | ICD-10-CM

## 2023-08-27 DIAGNOSIS — H1132 Conjunctival hemorrhage, left eye: Secondary | ICD-10-CM | POA: Diagnosis not present

## 2023-08-27 DIAGNOSIS — I10 Essential (primary) hypertension: Secondary | ICD-10-CM

## 2023-08-27 LAB — POCT URINALYSIS DIP (MANUAL ENTRY)
Bilirubin, UA: NEGATIVE
Blood, UA: NEGATIVE
Glucose, UA: NEGATIVE mg/dL
Ketones, POC UA: NEGATIVE mg/dL
Nitrite, UA: NEGATIVE
Protein Ur, POC: NEGATIVE mg/dL
Spec Grav, UA: 1.01 (ref 1.010–1.025)
Urobilinogen, UA: 0.2 U/dL
pH, UA: 6 (ref 5.0–8.0)

## 2023-08-27 NOTE — Discharge Instructions (Signed)
 Go to the emergency department for evaluation of your abdominal pain, constipation, back pain, and other symptoms.

## 2023-08-27 NOTE — ED Triage Notes (Signed)
 Patient to Urgent Care with complaints of right sided, lower back pain. Pain worse when bending over. Denies any radiation. Symptoms started when bending over (4-5 days). Reports her it "caught". Same symptoms approx 2-3 weeks ago. Taking ibuprofen, tylenol , icy-hot, lidocaine .   Also w/ left sided eye redness. Denies any pain or drainage. Unsure of when symptoms started.   Constipation x3-4 days.

## 2023-08-27 NOTE — ED Provider Notes (Signed)
 [facility]    CSN: [id number] Arrival date & time: 08/27/23  1418      History   Chief Complaint Chief Complaint  Patient presents with   Back Pain    HPI Wanda Cohen is a 89 y.o. female.  Accompanied by her daughter, patient presents with right lower back pain x 4 days.  Her pain started when she was bending over and her back "caught."  No trauma.  The pain does not radiate.  It is worse with bending over and improves with rest.  She has been treating her back pain with Tylenol , ibuprofen, IcyHot, lidocaine .  Patient also presents with abdominal discomfort and constipation.  She has not been able to effectively have a bowel movement in 4 days due to her back pain.  She was able to pass a small hard piece of stool this morning.  She states her abdomen feels full and distended and uncomfortable.  She has taken a stool softener twice and MiraLAX  once in the past 4 days.  Patient noted redness in her eye after straining to pass the small piece of stool this morning.  She denies fever, chills, vomiting, dysuria, hematuria, blood in stool, numbness, focal weakness, vision change, eye pain, eye trauma.  Her medical history includes colon cancer, breast cancer, hypertension, rheumatoid arthritis.  The history is provided by the patient, a relative and medical records.    Past Medical History:  Diagnosis Date   Colon cancer, ascending (HCC)    hx of   Hx of breast cancer    Hypertension    Hypothyroidism     Patient Active Problem List   Diagnosis Date Noted   Age-related osteoporosis without current pathological fracture 08/07/2023   Hiatal hernia 08/07/2023   Gastroesophageal reflux disease without esophagitis 08/07/2023   Chronic frontal sinusitis 08/07/2023   Eustachian tube dysfunction, bilateral 08/07/2023   Personal history of breast cancer 01/01/2022   Vitamin D  deficiency 06/17/2021   Hypothyroidism    Pelvis fracture (HCC) 06/15/2021   E Coli UTI (urinary  tract infection) 06/15/2021   Rheumatoid arthritis (HCC) 06/15/2021   Personal history of colon cancer    Diverticulosis of large intestine without diverticulitis    Body mass index 25.0-25.9, adult 07/18/2016   Chronic kidney disease, stage II (mild) 05/01/2016   Benign essential hypertension 04/21/2013   Pure hypercholesterolemia 10/07/2012    Past Surgical History:  Procedure Laterality Date   BREAST EXCISIONAL BIOPSY Right    COLONOSCOPY N/A 12/11/2012   Procedure: COLONOSCOPY;  Surgeon: Ruby Corporal, MD;  Location: [facility];  Service: Endoscopy;  Laterality: N/A;  1030   COLONOSCOPY N/A 07/09/2017   Procedure: COLONOSCOPY;  Surgeon: Alanda Allegra, MD;  Location: [facility];  Service: Gastroenterology;  Laterality: N/A;   MASTECTOMY Left    RIGHT COLECTOMY      OB History   No obstetric history on file.      Home Medications    Prior to Admission medications   Medication Sig Start Date End Date Taking? Authorizing Provider  CRANBERRY PO Take by mouth daily.    [provider]  D-MANNOSE PO Take by mouth daily.    [provider]  estradiol  (ESTRACE ) 0.1 MG/GM vaginal cream Estrogen Cream Instruction Discard applicator Apply pea sized amount to tip of finger to urethra before bed. Wash hands well after application. Use Monday, Wednesday and Friday 02/27/23   Dustin Gimenez, MD  fluticasone  (FLONASE ) 50 MCG/ACT nasal spray Place 2  sprays into both nostrils daily. 01/01/22   Quinton Buckler, FNP  folic acid  (FOLVITE ) 1 MG tablet Take 1 tablet (1 mg total) by mouth daily. 08/07/23   Quinton Buckler, FNP  golimumab  (SIMPONI  ARIA) 50 MG/4ML SOLN injection 2mg /kg    [provider]  levothyroxine  (SYNTHROID ) 75 MCG tablet Take 1 tablet (75 mcg total) by mouth daily. 08/07/23   Quinton Buckler, FNP  lisinopril  (ZESTRIL ) 5 MG tablet Take 1 tablet daily 08/07/23   Pender, Julie F, FNP  methotrexate  2.5 MG tablet Take by mouth See admin instructions.  Take 7 tablets weekly on the same day. Sunday    [provider]  omeprazole  (PRILOSEC) 40 MG capsule Take 1 capsule (40 mg total) by mouth daily. 08/07/23   Pender, Julie F, FNP  Probiotic Product (PROBIOTIC DAILY PO) Take by mouth daily.    [provider]  Vitamin D , Ergocalciferol , (DRISDOL ) 1.25 MG (50000 UNIT) CAPS capsule Take 1 capsule (50,000 Units total) by mouth once a week. 08/07/23   Quinton Buckler, FNP    Family History Family History  Problem Relation Age of Onset   Colon cancer Neg Hx    Breast cancer Neg Hx     Social History Social History   Tobacco Use   Smoking status: Never   Smokeless tobacco: Never  Vaping Use   Vaping status: Never Used  Substance Use Topics   Alcohol  use: No   Drug use: No     Allergies   Patient has no known allergies.   Review of Systems Review of Systems  Constitutional:  Negative for chills and fever.  Eyes:  Positive for redness. Negative for pain and visual disturbance.  Gastrointestinal:  Positive for abdominal distention, abdominal pain and constipation. Negative for blood in stool and vomiting.  Genitourinary:  Negative for dysuria and hematuria.  Musculoskeletal:  Positive for back pain. Negative for gait problem.  Skin:  Negative for color change, rash and wound.     Physical Exam Triage Vital Signs ED Triage Vitals  Encounter Vitals Group     BP 08/27/23 1503 (!) 166/74     Systolic BP Percentile --      Diastolic BP Percentile --      Pulse Rate 08/27/23 1503 90     Resp 08/27/23 1503 16     Temp 08/27/23 1503 (!) 97.5 F (36.4 C)     Temp src --      SpO2 08/27/23 1503 97 %     Weight --      Height --      Head Circumference --      Peak Flow --      Pain Score 08/27/23 1457 5     Pain Loc --      Pain Education --      Exclude from Growth Chart --    No data found.  Updated Vital Signs BP (!) 166/74   Pulse 90   Temp (!) 97.5 F (36.4 C)   Resp 16   SpO2 97%   Visual  Acuity Right Eye Distance:   Left Eye Distance:   Bilateral Distance:    Right Eye Near:   Left Eye Near:    Bilateral Near:     Physical Exam Constitutional:      General: She is not in acute distress. Eyes:     General:        Right eye: No discharge.  Left eye: No discharge.     Comments: Left subconjunctival hemorrhage.  Cardiovascular:     Rate and Rhythm: Normal rate and regular rhythm.     Heart sounds: Normal heart sounds.  Pulmonary:     Effort: Pulmonary effort is normal. No respiratory distress.     Breath sounds: Normal breath sounds.  Abdominal:     General: Bowel sounds are normal. There is distension.     Palpations: Abdomen is soft.     Tenderness: There is abdominal tenderness. There is no guarding or rebound.     Comments: Generalized tenderness to palpation of abdomen.  Musculoskeletal:        General: Tenderness present. No deformity. Normal range of motion.     Comments: Mild muscular tenderness of right lower back.   Skin:    General: Skin is warm and dry.     Findings: No bruising, erythema, lesion or rash.  Neurological:     General: No focal deficit present.     Mental Status: She is alert.     Sensory: No sensory deficit.     Motor: No weakness.     Comments: Ambulatory with walker.      UC Treatments / Results  Labs (all labs ordered are listed, but only abnormal results are displayed) Labs Reviewed  POCT URINALYSIS DIP (MANUAL ENTRY) - Abnormal; Notable for the following components:      Result Value   Leukocytes, UA Small (1+) (*)    All other components within normal limits    EKG   Radiology No results found.  Procedures Procedures (including critical care time)  Medications Ordered in UC Medications - No data to display  Initial Impression / Assessment and Plan / UC Course  I have reviewed the triage vital signs and the nursing notes.  Pertinent labs & imaging results that were available during my care of the  patient were reviewed by me and considered in my medical decision making (see chart for details).    Generalized abdominal pain, constipation, acute right lower back pain, subconjunctival hemorrhage of left eye, elevated blood pressure reading with hypertension.  Afebrile.  Discussed limitations of evaluation of her symptoms in an urgent care setting.  Sending patient to the ED for evaluation.  Patient and her daughter are agreeable to this.  Her daughter will drive her to [facility] ED which is their preference.  Daughter works at [employer].  Final Clinical Impressions(s) / UC Diagnoses   Final diagnoses:  Generalized abdominal pain  Constipation, unspecified constipation type  Acute right-sided low back pain without sciatica  Subconjunctival hemorrhage of left eye  Elevated blood pressure reading in office with diagnosis of hypertension     Discharge Instructions      Go to the emergency department for evaluation of your abdominal pain, constipation, back pain, and other symptoms.   ED Prescriptions   None    PDMP not reviewed this encounter.   Wellington Half, NP 08/27/23 [phone]

## 2023-08-28 ENCOUNTER — Ambulatory Visit: Admitting: Pulmonary Disease

## 2023-08-28 DIAGNOSIS — R1084 Generalized abdominal pain: Secondary | ICD-10-CM | POA: Diagnosis not present

## 2023-08-28 DIAGNOSIS — R9349 Abnormal radiologic findings on diagnostic imaging of other urinary organs: Secondary | ICD-10-CM | POA: Diagnosis not present

## 2023-09-03 DIAGNOSIS — H903 Sensorineural hearing loss, bilateral: Secondary | ICD-10-CM | POA: Diagnosis not present

## 2023-09-03 DIAGNOSIS — H6531 Chronic mucoid otitis media, right ear: Secondary | ICD-10-CM | POA: Diagnosis not present

## 2023-09-07 ENCOUNTER — Ambulatory Visit: Payer: Self-pay | Admitting: Pulmonary Disease

## 2023-09-10 ENCOUNTER — Encounter: Payer: Self-pay | Admitting: Pulmonary Disease

## 2023-09-10 NOTE — Telephone Encounter (Signed)
 The recommendation of year between studies is following Fleischner Society criteria this is a multidisciplinary thoracic imaging group that sets up the standards worldwide.  The patient does not smoke which makes her lower risk.  The nodule is getting smaller by last measurements.  The nodule is not solid and does not have a solid component and if there is any growth it is expected to be very very very slow.  We can set up an appointment to review films at her convenience if she would prefer.

## 2023-09-26 ENCOUNTER — Telehealth: Payer: Self-pay

## 2023-09-26 NOTE — Telephone Encounter (Signed)
 Copied from CRM [id number]. Topic: Clinical - Home Health Verbal Orders >> Sep 26, 2023  4:48 PM Delon DASEN wrote: Caller/Agency: son Izzabella Besse Number: [phone] Service Requested: Skilled Nursing and physical therapy Frequency: evaluation Any new concerns about the patient? Yes- complaining of bottom being raw, concerned about skin break down >> Sep 26, 2023  4:51 PM Delon T wrote: [facility]

## 2023-09-27 ENCOUNTER — Telehealth: Payer: Self-pay

## 2023-09-27 NOTE — Telephone Encounter (Signed)
 Left detailed vm needs face to face visit per referral and insurance purposes will need an appoitment.

## 2023-09-27 NOTE — Telephone Encounter (Signed)
 Sent to wrong practice    Copied from CRM [id number]. Topic: Referral - Question >> Sep 27, 2023 12:35 PM Charlet HERO wrote: Reason for CRM: [facility] wants to know if she would be able to see the patient  prior to the office visit on Thursday, The patients son is being persistent with the home health coming out. [phone]

## 2023-10-01 DIAGNOSIS — H903 Sensorineural hearing loss, bilateral: Secondary | ICD-10-CM | POA: Diagnosis not present

## 2023-10-01 DIAGNOSIS — H6523 Chronic serous otitis media, bilateral: Secondary | ICD-10-CM | POA: Diagnosis not present

## 2023-10-03 ENCOUNTER — Ambulatory Visit (INDEPENDENT_AMBULATORY_CARE_PROVIDER_SITE_OTHER): Admitting: Internal Medicine

## 2023-10-03 ENCOUNTER — Other Ambulatory Visit: Payer: Self-pay

## 2023-10-03 ENCOUNTER — Encounter: Payer: Self-pay | Admitting: Internal Medicine

## 2023-10-03 VITALS — BP 130/82 | HR 92 | Temp 98.0°F | Resp 16 | Ht 64.0 in | Wt 144.8 lb

## 2023-10-03 DIAGNOSIS — R238 Other skin changes: Secondary | ICD-10-CM | POA: Diagnosis not present

## 2023-10-03 DIAGNOSIS — M545 Low back pain, unspecified: Secondary | ICD-10-CM | POA: Diagnosis not present

## 2023-10-03 DIAGNOSIS — K449 Diaphragmatic hernia without obstruction or gangrene: Secondary | ICD-10-CM | POA: Diagnosis not present

## 2023-10-03 DIAGNOSIS — G8929 Other chronic pain: Secondary | ICD-10-CM | POA: Diagnosis not present

## 2023-10-03 DIAGNOSIS — M0609 Rheumatoid arthritis without rheumatoid factor, multiple sites: Secondary | ICD-10-CM | POA: Diagnosis not present

## 2023-10-03 DIAGNOSIS — R5383 Other fatigue: Secondary | ICD-10-CM | POA: Diagnosis not present

## 2023-10-03 DIAGNOSIS — Z79899 Other long term (current) drug therapy: Secondary | ICD-10-CM | POA: Diagnosis not present

## 2023-10-03 MED ORDER — TRAMADOL HCL 50 MG PO TABS
50.0000 mg | ORAL_TABLET | Freq: Every day | ORAL | 0 refills | Status: DC | PRN
Start: 2023-10-03 — End: 2023-11-14

## 2023-10-03 NOTE — Progress Notes (Signed)
 Acute Office Visit  Subjective:     Patient ID: Wanda Cohen, female    DOB: 03/03/1935, 89 y.o.   MRN: [id number]  Chief Complaint  Patient presents with   Abrasion    Break down of skin on buttock   Referral    To [facility] Skill Nursing    HPI Patient is in today for skin abrasion.   Discussed the use of AI scribe software for clinical note transcription with the patient, who gave verbal consent to proceed.  History of Present Illness Wanda Cohen is an 89 year old female who presents with chronic back pain and skin breakdown. She is accompanied by her son.  She has experienced skin breakdown for several weeks, described as tense, with no signs of infection. Her son, who works in assisted living, manages the area with Desitin cream.   Chronic back pain persists, with previous imaging showing moderate to severe spinal canal and foraminal narrowing. The pain varies in location and severity, reducing her activity levels. She manages the pain with Tylenol  and Advil, taken three times daily, sometimes together at night.   Review of Systems  Musculoskeletal:  Positive for back pain.  Skin:  Positive for rash.        Objective:    BP 130/82 (Cuff Size: Normal)   Pulse 92   Temp 98 F (36.7 C) (Oral)   Resp 16   Ht 5' 4 (1.626 m)   Wt 144 lb 12.8 oz (65.7 kg)   SpO2 94%   BMI 24.85 kg/m    Physical Exam Constitutional:      Appearance: Normal appearance.     Comments: Walking with walker  HENT:     Head: Normocephalic and atraumatic.  Eyes:     Conjunctiva/sclera: Conjunctivae normal.  Cardiovascular:     Rate and Rhythm: Normal rate and regular rhythm.  Pulmonary:     Effort: Pulmonary effort is normal.     Breath sounds: Normal breath sounds.  Skin:    General: Skin is warm and dry.     Comments: Superficial skin abrasions on buttocks, no sign of infection or ulceration   Neurological:     General: No focal deficit present.     Mental Status:  She is alert. Mental status is at baseline.  Psychiatric:        Mood and Affect: Mood normal.        Behavior: Behavior normal.     No results found for any visits on 10/03/23.      Assessment & Plan:   Assessment & Plan Chronic Back Pain Chronic low back pain with moderate to severe spinal canal and foraminal narrowing on MRI from 2022. Current management with Tylenol  and Advil is inadequate. Discussed Celebrex but cannot take due to concurrent Methotrexate  use.  - Prescribe Tramadol to try daily as needed for pain.  - Continue Tylenol  as needed for pain. - Discontinue Advil and Aleve to mitigate kidney and gastric risks. - Schedule follow-up in 4-6 weeks to evaluate medication's effectiveness.  Skin Breakdown Chronic skin breakdown likely due to friction from sitting. No infection, but skin is thin and prone to tearing, increasing infection risk. Emphasized offloading pressure to prevent further breakdown. - Use a donut pillow to offload pressure when sitting. - Apply barrier cream such as Vaseline or zinc oxide to protect the skin. - Refer to home health for regular monitoring and care.  Hiatal Hernia Hiatal hernia causing increased gastric acid production, leading to belching and  potential gastric issues. Managed with omeprazole  40 mg daily. - Continue omeprazole  40 mg daily. - Monitor effectiveness and consider switching to pantoprazole if symptoms worsen.    Return in about 6 weeks (around 11/14/2023) for PCP to recheck medication .  Wanda Fischer, DO

## 2023-10-09 DIAGNOSIS — M48061 Spinal stenosis, lumbar region without neurogenic claudication: Secondary | ICD-10-CM | POA: Diagnosis not present

## 2023-10-09 DIAGNOSIS — S31829D Unspecified open wound of left buttock, subsequent encounter: Secondary | ICD-10-CM | POA: Diagnosis not present

## 2023-10-10 ENCOUNTER — Telehealth: Payer: Self-pay

## 2023-10-10 DIAGNOSIS — M48061 Spinal stenosis, lumbar region without neurogenic claudication: Secondary | ICD-10-CM | POA: Diagnosis not present

## 2023-10-10 DIAGNOSIS — S31829D Unspecified open wound of left buttock, subsequent encounter: Secondary | ICD-10-CM | POA: Diagnosis not present

## 2023-10-10 NOTE — Telephone Encounter (Signed)
 Copied from CRM [id number]. Topic: Clinical - Home Health Verbal Orders >> Oct 09, 2023  4:56 PM Nathanel BROCKS wrote: Caller/Agency: [facility] Callback Number: [phone] Service Requested: Skilled Nursing Frequency: 1 week 4 and then reevaluate Any new concerns about the patient? Yes  Pt has a wound of left butt cheek and she just needs to need to know what the diagnosis code is for it, what it is.

## 2023-10-10 NOTE — Telephone Encounter (Signed)
 Copied from CRM [id number]. Topic: Clinical - Home Health Verbal Orders >> Oct 09, 2023  4:56 PM Nathanel BROCKS wrote: Caller/Agency: Jorene Cella Callback Number: [phone] Service Requested: Skilled Nursing Frequency: 1 week 4 and then reevaluate Any new concerns about the patient? Yes  Pt has a wound of left butt cheek and she just needs to need to know what the diagnosis code is for it, what it is. >> Oct 10, 2023  4:07 PM Dominique A wrote: Actuary to correct office.

## 2023-10-11 DIAGNOSIS — S31829D Unspecified open wound of left buttock, subsequent encounter: Secondary | ICD-10-CM | POA: Diagnosis not present

## 2023-10-11 DIAGNOSIS — M48061 Spinal stenosis, lumbar region without neurogenic claudication: Secondary | ICD-10-CM | POA: Diagnosis not present

## 2023-10-11 NOTE — Telephone Encounter (Signed)
 Verbal order and dx code given

## 2023-10-14 ENCOUNTER — Encounter: Payer: Self-pay | Admitting: Nurse Practitioner

## 2023-10-16 DIAGNOSIS — S31829D Unspecified open wound of left buttock, subsequent encounter: Secondary | ICD-10-CM | POA: Diagnosis not present

## 2023-10-16 DIAGNOSIS — H353131 Nonexudative age-related macular degeneration, bilateral, early dry stage: Secondary | ICD-10-CM | POA: Diagnosis not present

## 2023-10-16 DIAGNOSIS — M48061 Spinal stenosis, lumbar region without neurogenic claudication: Secondary | ICD-10-CM | POA: Diagnosis not present

## 2023-10-16 DIAGNOSIS — Z961 Presence of intraocular lens: Secondary | ICD-10-CM | POA: Diagnosis not present

## 2023-10-16 NOTE — Telephone Encounter (Signed)
Spoke to [employer]

## 2023-10-17 DIAGNOSIS — M48061 Spinal stenosis, lumbar region without neurogenic claudication: Secondary | ICD-10-CM | POA: Diagnosis not present

## 2023-10-17 DIAGNOSIS — S31829D Unspecified open wound of left buttock, subsequent encounter: Secondary | ICD-10-CM | POA: Diagnosis not present

## 2023-10-18 DIAGNOSIS — M48061 Spinal stenosis, lumbar region without neurogenic claudication: Secondary | ICD-10-CM | POA: Diagnosis not present

## 2023-10-18 DIAGNOSIS — S31829D Unspecified open wound of left buttock, subsequent encounter: Secondary | ICD-10-CM | POA: Diagnosis not present

## 2023-10-22 DIAGNOSIS — S31829D Unspecified open wound of left buttock, subsequent encounter: Secondary | ICD-10-CM | POA: Diagnosis not present

## 2023-10-22 DIAGNOSIS — M48061 Spinal stenosis, lumbar region without neurogenic claudication: Secondary | ICD-10-CM | POA: Diagnosis not present

## 2023-10-23 DIAGNOSIS — S31829D Unspecified open wound of left buttock, subsequent encounter: Secondary | ICD-10-CM | POA: Diagnosis not present

## 2023-10-23 DIAGNOSIS — M48061 Spinal stenosis, lumbar region without neurogenic claudication: Secondary | ICD-10-CM | POA: Diagnosis not present

## 2023-10-24 DIAGNOSIS — M48061 Spinal stenosis, lumbar region without neurogenic claudication: Secondary | ICD-10-CM | POA: Diagnosis not present

## 2023-10-24 DIAGNOSIS — S31829D Unspecified open wound of left buttock, subsequent encounter: Secondary | ICD-10-CM | POA: Diagnosis not present

## 2023-10-29 DIAGNOSIS — S31829D Unspecified open wound of left buttock, subsequent encounter: Secondary | ICD-10-CM | POA: Diagnosis not present

## 2023-10-29 DIAGNOSIS — M48061 Spinal stenosis, lumbar region without neurogenic claudication: Secondary | ICD-10-CM | POA: Diagnosis not present

## 2023-10-31 ENCOUNTER — Ambulatory Visit (INDEPENDENT_AMBULATORY_CARE_PROVIDER_SITE_OTHER): Admitting: Orthopedic Surgery

## 2023-10-31 DIAGNOSIS — S31829D Unspecified open wound of left buttock, subsequent encounter: Secondary | ICD-10-CM | POA: Diagnosis not present

## 2023-10-31 DIAGNOSIS — M48061 Spinal stenosis, lumbar region without neurogenic claudication: Secondary | ICD-10-CM | POA: Diagnosis not present

## 2023-10-31 DIAGNOSIS — M810 Age-related osteoporosis without current pathological fracture: Secondary | ICD-10-CM

## 2023-10-31 MED ORDER — PREDNISONE 10 MG PO TABS: 10.0000 mg | ORAL_TABLET | Freq: Every day | ORAL | 3 refills | Status: AC

## 2023-11-05 ENCOUNTER — Encounter: Payer: Self-pay | Admitting: Orthopedic Surgery

## 2023-11-05 NOTE — Progress Notes (Signed)
 Office Visit Note   Patient: Wanda Cohen           Date of Birth: 10-16-1934           MRN: [id number] Visit Date: 10/31/2023              Requested by: Gareth Mliss FALCON, FNP [address] [place],  [place] [address] PCP: Gareth Mliss FALCON, FNP  Chief Complaint  Patient presents with   Middle Back - Pain      HPI: Patient is an 89 year old woman who was seen for initial evaluation for her lumbar thoracic spine.  Patient states that she went to the emergency department at [facility] and was told she had a compression fracture at T11.  She was not provided any treatment.  She currently ambulates with a rolling walker.  Past medical history significant for history of osteoporosis and rheumatoid arthritis she is on methotrexate .  She takes 50,000 international units of vitamin D3 a week.  Patient states she has pain that radiates along the left ribs laterally.  Assessment & Plan: Visit Diagnoses:  1. Age-related osteoporosis without current pathological fracture     Plan: A prescription was provided for prednisone  to help with her radicular symptoms.  Will have patient [facility].  Follow-Up Instructions: Return in about 4 weeks (around 11/28/2023).   Ortho Exam  Patient is alert, oriented, no adenopathy, well-dressed, normal affect, normal respiratory effort. Review of her radiographs shows osteoporosis with degenerative disc disease and spurring.  She does have kyphosis with spurring anteriorly.  Patient has no compression fractures.  No focal motor weakness in either lower extremity.  Pain primarily radicular thoracic pain on the left.  I have reviewed the patient's history and given the presence of a fragility fracture, I have deemed the necessity of a osteoporosis management referral or confirmed that the patient is currently enrolled in a osteoporosis treatment program.     Imaging: No results found. No images are  attached to the encounter.  Labs: Lab Results  Component Value Date   REPTSTATUS 06/20/2021 FINAL 06/15/2021   REPTSTATUS 06/20/2021 FINAL 06/15/2021   CULT  06/15/2021    NO GROWTH 5 DAYS Performed at [facility], [address], [place], [place] [address]    CULT  06/15/2021    NO GROWTH 5 DAYS Performed at [facility], [address] [place], [place] [address]    [facility] ESCHERICHIA COLI (A) 06/15/2021     Lab Results  Component Value Date   ALBUMIN 3.7 06/15/2021    No results found for: MG Lab Results  Component Value Date   VD25OH 83 08/07/2023   VD25OH 86 07/04/2022   VD25OH 97 01/01/2022    No results found for: PREALBUMIN    Latest Ref Rng & Units 08/07/2023    3:19 PM 01/01/2022    9:48 AM 06/16/2021    6:29 AM  CBC EXTENDED  WBC 3.8 - 10.8 Thousand/uL 7.2  5.9  12.6   RBC 3.80 - 5.10 Million/uL 3.66  3.55  3.57   Hemoglobin 11.7 - 15.5 g/dL 88.8  88.8  89.0   HCT 35.0 - 45.0 % 34.5  33.1  34.8   Platelets 140 - 400 Thousand/uL 257  228  184   NEUT# 1,500 - 7,800 cells/uL 4,169  3,782    Lymph# 850 - 3,900 cells/uL  956       There is no height or weight on file to  calculate BMI.  Orders:  Orders Placed This Encounter  Procedures   Ambulatory referral to Orthopedic Surgery   Meds ordered this encounter  Medications   predniSONE  (DELTASONE ) 10 MG tablet    Sig: Take 1 tablet (10 mg total) by mouth daily with breakfast.    Dispense:  30 tablet    Refill:  3     Procedures: No procedures performed  Clinical Data: No additional findings.  ROS:  All other systems negative, except as noted in the HPI. Review of Systems  Objective: Vital Signs: There were no vitals taken for this visit.  Specialty Comments:  No specialty comments available.  PMFS History: Patient Active Problem List   Diagnosis Date Noted   Age-related osteoporosis without current pathological fracture 08/07/2023   Hiatal hernia 08/07/2023   Gastroesophageal  reflux disease without esophagitis 08/07/2023   Chronic frontal sinusitis 08/07/2023   Eustachian tube dysfunction, bilateral 08/07/2023   Personal history of breast cancer 01/01/2022   Vitamin D  deficiency 06/17/2021   Hypothyroidism    Pelvis fracture (HCC) 06/15/2021   E Coli UTI (urinary tract infection) 06/15/2021   Rheumatoid arthritis (HCC) 06/15/2021   Personal history of colon cancer    Diverticulosis of large intestine without diverticulitis    Body mass index 25.0-25.9, adult 07/18/2016   Chronic kidney disease, stage II (mild) 05/01/2016   Benign essential hypertension 04/21/2013   Pure hypercholesterolemia 10/07/2012   Past Medical History:  Diagnosis Date   Colon cancer, ascending (HCC)    hx of   Hx of breast cancer    Hypertension    Hypothyroidism     Family History  Problem Relation Age of Onset   Colon cancer Neg Hx    Breast cancer Neg Hx     Past Surgical History:  Procedure Laterality Date   BREAST EXCISIONAL BIOPSY Right    COLONOSCOPY N/A 12/11/2012   Procedure: COLONOSCOPY;  Surgeon: Claudis RAYMOND Rivet, MD;  [facility];  Service: Endoscopy;  Laterality: N/A;  1030   COLONOSCOPY N/A 07/09/2017   Procedure: COLONOSCOPY;  Surgeon: Mavis Anes, MD;  [facility];  Service: Gastroenterology;  Laterality: N/A;   MASTECTOMY Left    RIGHT COLECTOMY     Social History   Occupational History   Not on file  Tobacco Use   Smoking status: Never   Smokeless tobacco: Never  Vaping Use   Vaping status: Never Used  Substance and Sexual Activity   Alcohol  use: No   Drug use: No   Sexual activity: Not on file

## 2023-11-06 DIAGNOSIS — M48061 Spinal stenosis, lumbar region without neurogenic claudication: Secondary | ICD-10-CM | POA: Diagnosis not present

## 2023-11-06 DIAGNOSIS — S31829D Unspecified open wound of left buttock, subsequent encounter: Secondary | ICD-10-CM | POA: Diagnosis not present

## 2023-11-08 DIAGNOSIS — S31829D Unspecified open wound of left buttock, subsequent encounter: Secondary | ICD-10-CM | POA: Diagnosis not present

## 2023-11-08 DIAGNOSIS — M48061 Spinal stenosis, lumbar region without neurogenic claudication: Secondary | ICD-10-CM | POA: Diagnosis not present

## 2023-11-13 DIAGNOSIS — M48061 Spinal stenosis, lumbar region without neurogenic claudication: Secondary | ICD-10-CM | POA: Diagnosis not present

## 2023-11-13 DIAGNOSIS — S31829D Unspecified open wound of left buttock, subsequent encounter: Secondary | ICD-10-CM | POA: Diagnosis not present

## 2023-11-14 ENCOUNTER — Ambulatory Visit: Admitting: Nurse Practitioner

## 2023-11-14 ENCOUNTER — Ambulatory Visit (INDEPENDENT_AMBULATORY_CARE_PROVIDER_SITE_OTHER): Admitting: Nurse Practitioner

## 2023-11-14 ENCOUNTER — Encounter: Payer: Self-pay | Admitting: Nurse Practitioner

## 2023-11-14 VITALS — BP 136/68 | HR 63 | Resp 18 | Ht 64.0 in

## 2023-11-14 DIAGNOSIS — M069 Rheumatoid arthritis, unspecified: Secondary | ICD-10-CM

## 2023-11-14 DIAGNOSIS — E7800 Pure hypercholesterolemia, unspecified: Secondary | ICD-10-CM

## 2023-11-14 DIAGNOSIS — I10 Essential (primary) hypertension: Secondary | ICD-10-CM

## 2023-11-14 DIAGNOSIS — K449 Diaphragmatic hernia without obstruction or gangrene: Secondary | ICD-10-CM | POA: Diagnosis not present

## 2023-11-14 DIAGNOSIS — Z853 Personal history of malignant neoplasm of breast: Secondary | ICD-10-CM | POA: Diagnosis not present

## 2023-11-14 DIAGNOSIS — E039 Hypothyroidism, unspecified: Secondary | ICD-10-CM | POA: Diagnosis not present

## 2023-11-14 DIAGNOSIS — N182 Chronic kidney disease, stage 2 (mild): Secondary | ICD-10-CM | POA: Diagnosis not present

## 2023-11-14 DIAGNOSIS — K219 Gastro-esophageal reflux disease without esophagitis: Secondary | ICD-10-CM | POA: Diagnosis not present

## 2023-11-14 DIAGNOSIS — Z85038 Personal history of other malignant neoplasm of large intestine: Secondary | ICD-10-CM

## 2023-11-14 DIAGNOSIS — G8929 Other chronic pain: Secondary | ICD-10-CM

## 2023-11-14 DIAGNOSIS — M545 Low back pain, unspecified: Secondary | ICD-10-CM | POA: Diagnosis not present

## 2023-11-14 DIAGNOSIS — M810 Age-related osteoporosis without current pathological fracture: Secondary | ICD-10-CM

## 2023-11-14 MED ORDER — GABAPENTIN 100 MG PO CAPS: 100.0000 mg | ORAL_CAPSULE | Freq: Every day | ORAL | 0 refills | Status: DC

## 2023-11-14 MED ORDER — TRAMADOL HCL 50 MG PO TABS: 50.0000 mg | ORAL_TABLET | Freq: Every day | ORAL | 3 refills | Status: DC | PRN

## 2023-11-14 NOTE — Progress Notes (Addendum)
 BP 136/68   Pulse 63   Resp 18   Ht 5' 4 (1.626 m)   SpO2 99%   BMI 24.85 kg/m    Subjective:    Patient ID: Wanda Cohen, female    DOB: Mar 16, 1935, 89 y.o.   MRN: [id number]  HPI: Wanda Cohen is a 89 y.o. female  Chief Complaint  Patient presents with   Medical Management of Chronic Issues   Back Pain    Left upper back    Discussed the use of AI scribe software for clinical note transcription with the patient, who gave verbal consent to proceed.  History of Present Illness Wanda Cohen is an 89 year old female with chronic low back pain who presents for a routine follow-up. She is accompanied by her daughter.  Chronic low back pain and compression fracture - Chronic low back pain with moderate to severe spinal canal and foraminal narrowing on MRI from 2022 - Currently taking tramadol  and Tylenol  for pain management - Recent initiation of prednisone  for compression fracture with improvement in back pain  Left-sided burning pain - Left-sided burning pain, present for at least three weeks - Pain worsens at night - No recent falls or injuries  Gastroesophageal reflux and hiatal hernia - GERD and hiatal hernia, possibly exacerbated by rectus diastasis on CT scan - Currently taking omeprazole  40 mg daily - Acid reflux improved but still present - Occasional belching  Skin breakdown - Prior skin breakdown, now healed but remains tender - Previously used barrier cream for management    Mastectomy/hx of breast cancer -patient has a hx of breast cancer and had a left mastectomy. -patient is requesting specialized bras.       08/07/2023    2:34 PM 01/31/2023    1:20 PM 01/14/2023    2:27 PM  Depression screen PHQ 2/9  Decreased Interest 0 0 0  Down, Depressed, Hopeless 0 0 0  PHQ - 2 Score 0 0 0  Altered sleeping 0  0  Tired, decreased energy 0  0  Change in appetite 0  0  Feeling bad or failure about yourself  0  0  Trouble concentrating 0  0   Moving slowly or fidgety/restless 0  0  Suicidal thoughts 0  0  PHQ-9 Score 0  0  Difficult doing work/chores Not difficult at all  Not difficult at all    Relevant past medical, surgical, family and social history reviewed and updated as indicated. Interim medical history since our last visit reviewed. Allergies and medications reviewed and updated.  Review of Systems  Ten systems reviewed and is negative except as mentioned in HPI      Objective:     BP 136/68   Pulse 63   Resp 18   Ht 5' 4 (1.626 m)   SpO2 99%   BMI 24.85 kg/m    Wt Readings from Last 3 Encounters:  10/03/23 144 lb 12.8 oz (65.7 kg)  08/07/23 145 lb 8 oz (66 kg)  06/04/23 146 lb 6.4 oz (66.4 kg)    Physical Exam Physical Exam GENERAL: Alert, cooperative, well developed, no acute distress. HEENT: Normocephalic, normal oropharynx, moist mucous membranes. CHEST: Clear to auscultation bilaterally, no wheezes, rhonchi, or crackles. CARDIOVASCULAR: Normal heart rate and rhythm, S1 and S2 normal without murmurs. ABDOMEN: Soft, non-tender, non-distended, without organomegaly, normal bowel sounds. EXTREMITIES: No cyanosis or edema. NEUROLOGICAL: Cranial nerves grossly intact, moves all extremities without gross motor or sensory deficit. SKIN: No skin breakdown  or shingles.   Results for orders placed or performed during the hospital encounter of 08/27/23  POCT urinalysis dipstick   Collection Time: 08/27/23  3:19 PM  Result Value Ref Range   Color, UA yellow yellow   Clarity, UA clear clear   Glucose, UA negative negative mg/dL   Bilirubin, UA negative negative   Ketones, POC UA negative negative mg/dL   Spec Grav, UA 8.989 8.989 - 1.025   Blood, UA negative negative   pH, UA 6.0 5.0 - 8.0   Protein Ur, POC negative negative mg/dL   Urobilinogen, UA 0.2 0.2 or 1.0 E.U./dL   Nitrite, UA Negative Negative   Leukocytes, UA Small (1+) (A) Negative          Assessment & Plan:   Problem List  Items Addressed This Visit       Cardiovascular and Mediastinum   Benign essential hypertension - Primary     Respiratory   Hiatal hernia     Digestive   Gastroesophageal reflux disease without esophagitis     Endocrine   Hypothyroidism (Chronic)     Musculoskeletal and Integument   Rheumatoid arthritis (HCC)   Relevant Medications   traMADol  (ULTRAM ) 50 MG tablet   Age-related osteoporosis without current pathological fracture     Genitourinary   Chronic kidney disease, stage II (mild)     Other   Personal history of colon cancer   Pure hypercholesterolemia   Personal history of breast cancer   Other Visit Diagnoses       Chronic midline low back pain, unspecified whether sciatica present       Relevant Medications   traMADol  (ULTRAM ) 50 MG tablet   gabapentin  (NEURONTIN ) 100 MG capsule        Assessment and Plan Assessment & Plan Compression fracture of thoracic spine with chronic low back pain and spinal canal/foraminal narrowing Chronic low back pain with moderate to severe spinal canal and foraminal narrowing. Compression fracture at T9-11 identified on CT. Pain management with tramadol  and prednisone  has shown improvement. Pain likely related to back issues. - Continue tramadol  for pain management - Send tramadol  prescription to [facility] on [address]  Left-sided thoracic/rib neuropathic pain Left-sided thoracic/rib pain, possibly neuropathic in nature. Pain described as burning and worse at night. Differential includes nerve pain from back issues or shingles, though no rash present. - Prescribe gabapentin  100 mg at bedtime for nerve pain - Monitor for rash or worsening symptoms  Gastroesophageal reflux disease with hiatal hernia GERD symptoms have improved with current medication regimen. Omeprazole  is effective, but pantoprazole  is an alternative if symptoms worsen. Hiatal hernia may contribute to symptoms. Fosamax  previously declined due to side effects,  Prolia  injections discussed as an alternative with potentially fewer side effects. - Continue omeprazole  40 mg daily - Consider switch to pantoprazole  if symptoms worsen  Hypertension Hypertension managed with lisinopril  5 mg daily.  Hypothyroidism Hypothyroidism managed with levothyroxine  75 mcg daily.  Rheumatoid arthritis Rheumatoid arthritis management ongoing.        Follow up plan: Return for follow up already scheduled.

## 2023-11-15 ENCOUNTER — Other Ambulatory Visit: Payer: Self-pay | Admitting: Nurse Practitioner

## 2023-11-15 DIAGNOSIS — G8929 Other chronic pain: Secondary | ICD-10-CM

## 2023-11-15 MED ORDER — TRAMADOL HCL 50 MG PO TABS: 50.0000 mg | ORAL_TABLET | Freq: Every day | ORAL | 3 refills | Status: DC | PRN

## 2023-11-20 ENCOUNTER — Telehealth: Payer: Self-pay | Admitting: Physician Assistant

## 2023-11-20 DIAGNOSIS — M48061 Spinal stenosis, lumbar region without neurogenic claudication: Secondary | ICD-10-CM | POA: Diagnosis not present

## 2023-11-20 DIAGNOSIS — S31829D Unspecified open wound of left buttock, subsequent encounter: Secondary | ICD-10-CM | POA: Diagnosis not present

## 2023-11-20 NOTE — Telephone Encounter (Signed)
 Called pt and left vm for pt to call Crystal be to reschedule Ronal Caldron appt

## 2023-11-25 ENCOUNTER — Encounter: Admitting: Physician Assistant

## 2023-11-27 DIAGNOSIS — S31829D Unspecified open wound of left buttock, subsequent encounter: Secondary | ICD-10-CM | POA: Diagnosis not present

## 2023-11-27 DIAGNOSIS — M48061 Spinal stenosis, lumbar region without neurogenic claudication: Secondary | ICD-10-CM | POA: Diagnosis not present

## 2023-11-28 ENCOUNTER — Encounter: Payer: Self-pay | Admitting: Orthopedic Surgery

## 2023-11-28 ENCOUNTER — Ambulatory Visit: Admitting: Orthopedic Surgery

## 2023-11-28 ENCOUNTER — Ambulatory Visit (INDEPENDENT_AMBULATORY_CARE_PROVIDER_SITE_OTHER): Admitting: Orthopedic Surgery

## 2023-11-28 DIAGNOSIS — M810 Age-related osteoporosis without current pathological fracture: Secondary | ICD-10-CM

## 2023-11-28 DIAGNOSIS — M0609 Rheumatoid arthritis without rheumatoid factor, multiple sites: Secondary | ICD-10-CM | POA: Diagnosis not present

## 2023-11-28 NOTE — Progress Notes (Signed)
 Office Visit Note   Patient: Wanda Cohen           Date of Birth: [birth year]           MRN: [id number] Visit Date: 11/28/2023              Requested by: Gareth Mliss FALCON, FNP [address] [place],  [place] [address] PCP: Gareth Mliss FALCON, FNP  Chief Complaint  Patient presents with   Middle Back - Pain    T 11 compression fx       HPI: Discussed the use of AI scribe software for clinical note transcription with the patient, who gave verbal consent to proceed.  History of Present Illness Wanda Cohen is an 89 year old female who presents for follow-up regarding her back pain management. She is accompanied by her son. Her daughter manages her appointments.  She is currently not experiencing any back pain. Previously, she had significant pain due to a compressed disc. Her current pain level is 3 out of 10, which she finds discomforting, and she has experienced some sciatica pain as well.  She previously tried gabapentin  at a dose of 100 mg for four to five days but discontinued it due to lack of perceived benefit. She has been taking prednisone , which she reports has been helpful, and she has one pill left with three refills available. She has been taking one pill every morning.  She has an upcoming appointment at the osteoporosis clinic, which was previously canceled due to scheduling conflicts. Her daughter, who manages her appointments, was in [place]  at the time, which contributed to the scheduling issues. She has completed exercises with Beata and has been provided with a list of exercises to continue at home. She acknowledges that she sits more than she should.     Assessment & Plan: Visit Diagnoses:  1. Age-related osteoporosis without current pathological fracture     Plan: Assessment and Plan Assessment & Plan T11 thoracic vertebral compression fracture in the setting of osteoporosis T11 fracture likely due to osteoporosis. Gabapentin   ineffective, discontinued. Prednisone  effective for pain. Discussed osteoporosis management, bone density studies, and bone-strengthening medication. - Continue prednisone  as needed for pain management. - Follow up with osteoporosis clinic for bone density studies and potential initiation of bone-strengthening medication. - Encourage weight-bearing exercises to improve bone density.  Kyphosis Kyphosis likely related to osteoporosis and vertebral compression fracture.  Gait abnormality, uses walker Gait abnormality present, uses walker. No sciatic symptoms or focal motor weakness.      Follow-Up Instructions: Return if symptoms worsen or fail to improve.   Ortho Exam  Patient is alert, oriented, no adenopathy, well-dressed, normal affect, normal respiratory effort. Physical Exam MUSCULOSKELETAL: Ambulates with rolling walker, kyphosis present. No sciatic symptoms. NEUROLOGICAL: No focal motor weakness.      Imaging: No results found. No images are attached to the encounter.  Labs: Lab Results  Component Value Date   REPTSTATUS 06/20/2021 FINAL 06/15/2021   REPTSTATUS 06/20/2021 FINAL 06/15/2021   CULT  06/15/2021    NO GROWTH 5 DAYS Performed at [facility], [address] [place], [place] [address]    CULT  06/15/2021    NO GROWTH 5 DAYS Performed at [facility], [address], [place], [place] [address]    [facility] ESCHERICHIA COLI (A) 06/15/2021     Lab Results  Component Value Date   ALBUMIN 3.7 06/15/2021    No results found for: MG Lab Results  Component Value Date  VD25OH 83 08/07/2023   VD25OH 86 07/04/2022   VD25OH 97 01/01/2022    No results found for: PREALBUMIN    Latest Ref Rng & Units 08/07/2023    3:19 PM 01/01/2022    9:48 AM 06/16/2021    6:29 AM  CBC EXTENDED  WBC 3.8 - 10.8 Thousand/uL 7.2  5.9  12.6   RBC 3.80 - 5.10 Million/uL 3.66  3.55  3.57   Hemoglobin 11.7 - 15.5 g/dL 88.8  88.8  89.0   HCT 35.0 - 45.0 % 34.5  33.1  34.8    Platelets 140 - 400 Thousand/uL 257  228  184   NEUT# 1,500 - 7,800 cells/uL 4,169  3,782    Lymph# 850 - 3,900 cells/uL  956       There is no height or weight on file to calculate BMI.  Orders:  No orders of the defined types were placed in this encounter.  No orders of the defined types were placed in this encounter.    Procedures: No procedures performed  Clinical Data: No additional findings.  ROS:  All other systems negative, except as noted in the HPI. Review of Systems  Objective: Vital Signs: There were no vitals taken for this visit.  Specialty Comments:  No specialty comments available.  PMFS History: Patient Active Problem List   Diagnosis Date Noted   Age-related osteoporosis without current pathological fracture 08/07/2023   Hiatal hernia 08/07/2023   Gastroesophageal reflux disease without esophagitis 08/07/2023   Chronic frontal sinusitis 08/07/2023   Eustachian tube dysfunction, bilateral 08/07/2023   Personal history of breast cancer 01/01/2022   Vitamin D  deficiency 06/17/2021   Hypothyroidism    Rheumatoid arthritis (HCC) 06/15/2021   Personal history of colon cancer    Diverticulosis of large intestine without diverticulitis    Body mass index 25.0-25.9, adult 07/18/2016   Chronic kidney disease, stage II (mild) 05/01/2016   Benign essential hypertension 04/21/2013   Pure hypercholesterolemia 10/07/2012   Past Medical History:  Diagnosis Date   Colon cancer, ascending (HCC)    hx of   Hx of breast cancer    Hypertension    Hypothyroidism     Family History  Problem Relation Age of Onset   Colon cancer Neg Hx    Breast cancer Neg Hx     Past Surgical History:  Procedure Laterality Date   BREAST EXCISIONAL BIOPSY Right    COLONOSCOPY N/A 12/11/2012   Procedure: COLONOSCOPY;  Surgeon: Claudis RAYMOND Rivet, MD;  [facility];  Service: Endoscopy;  Laterality: N/A;  1030   COLONOSCOPY N/A 07/09/2017   Procedure:  COLONOSCOPY;  Surgeon: Mavis Anes, MD;  [facility];  Service: Gastroenterology;  Laterality: N/A;   MASTECTOMY Left    RIGHT COLECTOMY     Social History   Occupational History   Not on file  Tobacco Use   Smoking status: Never   Smokeless tobacco: Never  Vaping Use   Vaping status: Never Used  Substance and Sexual Activity   Alcohol  use: No   Drug use: No   Sexual activity: Not on file

## 2023-12-06 ENCOUNTER — Encounter: Payer: Self-pay | Admitting: Physician Assistant

## 2023-12-06 ENCOUNTER — Ambulatory Visit: Admitting: Physician Assistant

## 2023-12-06 VITALS — Ht 61.0 in | Wt 154.4 lb

## 2023-12-06 DIAGNOSIS — M810 Age-related osteoporosis without current pathological fracture: Secondary | ICD-10-CM | POA: Diagnosis not present

## 2023-12-06 NOTE — Progress Notes (Signed)
 Office Visit Note   Patient: Wanda Cohen           Date of Birth: February 08, 1935           MRN: [id number] Visit Date: 12/06/2023              Requested by: Gareth Mliss FALCON, FNP [address] [place],  [place] [address] PCP: Gareth Mliss FALCON, FNP   Assessment & Plan: Visit Diagnoses:  1. Age-related osteoporosis without current pathological fracture     Plan: Patient is a pleasant 89 year old woman who comes in today with her daughter.  She is referred from Dr. Harden for evaluation and treatment of osteoporosis.  She does not really take any specific medications for osteoporosis other than over-the-counter counter calcium .  She has a history of pelvis fractures in 2023.  Also has a history of compression fractures more recently.  Of the vertebral spine.  She has no history of heart disease.  She does have a history of breast cancer and colon cancer.  She has a history of elevated kidney values and lower GFR.  No history of ulcers but does have a history of hiatal hernia.  In severe reflux.  No history of epilepsy.  She went through menopause in her early 49s.  She takes vitamin D  1 tablet weekly.  She has never done hormone replacement therapy.  She is not a smoker does not drink alcohol .  She does not do very much exercise as she lives in a retirement home and not much is offered.  She has had no major dental work and does not have a family history of spine fracture.  I had a long discussion she did have a bone density scan with a T-score of -3.1.  She also has rheumatoid arthritis.  Given all this her FRAX score is a 38% for major osteoporotic fracture and 17% for hip fracture.  She would not be a good candidate for bisphosphonate because of her reflux hiatal hernia and elevated kidney values.  She is at high risk of refracturing.  After reviewing her chart and talking with her and her daughter for 45 minutes reviewing medications and side effects I think it would be appropriate for  her to take Prolia.  Given him information about this.  We will go forward with authorization.  We also talked about lifestyle including diet and exercise.  Living in a retirement home she has not a lot of control over her diet however will try to keep track her calcium  with given her information on how to do this and what choices she could make with regards to calcium  rich foods  Follow-Up Instructions: Return if symptoms worsen or fail to improve.   Orders:  No orders of the defined types were placed in this encounter.  No orders of the defined types were placed in this encounter.     Procedures: No procedures performed   Clinical Data: No additional findings.   Subjective: No chief complaint on file.   HPI pleasant 89 year old woman is here with her daughter referred by Dr. Harden for evaluation of osteoporosis treatment  Review of Systems  All other systems reviewed and are negative.    Objective: Vital Signs: Ht 5' 1 (1.549 m)   Wt 154 lb 6.4 oz (70 kg)   BMI 29.17 kg/m   Physical Exam Constitutional:      Appearance: Normal appearance.  Pulmonary:     Effort: Pulmonary effort is normal.  Skin:  General: Skin is warm and dry.  Neurological:     General: No focal deficit present.     Mental Status: She is alert and oriented to person, place, and time.       Specialty Comments:  No specialty comments available.  Imaging: No results found.   PMFS History: Patient Active Problem List   Diagnosis Date Noted   Age-related osteoporosis without current pathological fracture 08/07/2023   Hiatal hernia 08/07/2023   Gastroesophageal reflux disease without esophagitis 08/07/2023   Chronic frontal sinusitis 08/07/2023   Eustachian tube dysfunction, bilateral 08/07/2023   Personal history of breast cancer 01/01/2022   Vitamin D  deficiency 06/17/2021   Hypothyroidism    Rheumatoid arthritis (HCC) 06/15/2021   Personal history of colon cancer     Diverticulosis of large intestine without diverticulitis    Body mass index 25.0-25.9, adult 07/18/2016   Chronic kidney disease, stage II (mild) 05/01/2016   Benign essential hypertension 04/21/2013   Pure hypercholesterolemia 10/07/2012   Past Medical History:  Diagnosis Date   Colon cancer, ascending (HCC)    hx of   Hx of breast cancer    Hypertension    Hypothyroidism     Family History  Problem Relation Age of Onset   Colon cancer Neg Hx    Breast cancer Neg Hx     Past Surgical History:  Procedure Laterality Date   BREAST EXCISIONAL BIOPSY Right    COLONOSCOPY N/A 12/11/2012   Procedure: COLONOSCOPY;  Surgeon: Claudis RAYMOND Rivet, MD;  [facility];  Service: Endoscopy;  Laterality: N/A;  1030   COLONOSCOPY N/A 07/09/2017   Procedure: COLONOSCOPY;  Surgeon: Mavis Anes, MD;  [facility];  Service: Gastroenterology;  Laterality: N/A;   MASTECTOMY Left    RIGHT COLECTOMY     Social History   Occupational History   Not on file  Tobacco Use   Smoking status: Never   Smokeless tobacco: Never  Vaping Use   Vaping status: Never Used  Substance and Sexual Activity   Alcohol  use: No   Drug use: No   Sexual activity: Not on file

## 2023-12-23 ENCOUNTER — Encounter: Payer: Self-pay | Admitting: Nurse Practitioner

## 2024-01-08 ENCOUNTER — Other Ambulatory Visit: Payer: Self-pay

## 2024-01-08 DIAGNOSIS — Z853 Personal history of malignant neoplasm of breast: Secondary | ICD-10-CM

## 2024-01-09 ENCOUNTER — Ambulatory Visit: Payer: Medicare Other

## 2024-01-09 ENCOUNTER — Other Ambulatory Visit: Payer: Self-pay | Admitting: Radiology

## 2024-01-09 VITALS — Ht 61.0 in | Wt 142.0 lb

## 2024-01-09 DIAGNOSIS — Z0000 Encounter for general adult medical examination without abnormal findings: Secondary | ICD-10-CM | POA: Diagnosis not present

## 2024-01-09 DIAGNOSIS — M810 Age-related osteoporosis without current pathological fracture: Secondary | ICD-10-CM

## 2024-01-09 MED ORDER — DENOSUMAB 60 MG/ML ~~LOC~~ SOSY
60.0000 mg | PREFILLED_SYRINGE | Freq: Once | SUBCUTANEOUS | Status: AC
  Administered 2024-02-18: 60 mg via SUBCUTANEOUS

## 2024-01-09 NOTE — Progress Notes (Signed)
 Subjective:   Wanda Cohen is a 89 y.o. female who presents for Medicare Annual (Subsequent) preventive examination.  Visit Complete: Virtual I connected with  Orlean Glatter on 01/09/24 by a audio enabled telemedicine application and verified that I am speaking with the correct person using two identifiers.  Patient Location: Home  Provider Location: Home Office  I discussed the limitations of evaluation and management by telemedicine. The patient expressed understanding and agreed to proceed.  Vital Signs: Because this visit was a virtual/telehealth visit, some criteria may be missing or patient reported. Any vitals not documented were not able to be obtained and vitals that have been documented are patient reported.  Cardiac Risk Factors include: advanced age (>53men, >36 women);dyslipidemia;hypertension;sedentary lifestyle     Objective:    Today's Vitals   01/09/24 1051 01/09/24 1052  Weight: 142 lb (64.4 kg)   Height: 5' 1 (1.549 m)   PainSc:  2    Body mass index is 26.83 kg/m.     01/09/2024   11:00 AM 08/27/2023    2:57 PM 01/03/2023   11:13 AM 06/15/2021    6:56 PM 06/15/2021    2:28 PM 07/09/2017    6:34 AM 12/11/2012    9:49 AM  Advanced Directives  Does Patient Have a Medical Advance Directive? Yes No Yes Yes No;Yes Yes  Patient has advance directive, copy not in chart   Type of Advance Directive Healthcare Power of [place];Living will  Healthcare Power of [place];Living will Healthcare Power of [place];Living will Living will Healthcare Power of [place];Living will Living will;Healthcare Power of Attorney   Does patient want to make changes to medical advance directive? No - Patient declined   No - Patient declined     Copy of Healthcare Power of Attorney in Chart? Yes - validated most recent copy scanned in chart (See row information)   No - copy requested  No - copy requested  Copy requested from family   Would patient like information on creating a  medical advance directive?    No - Patient declined No - Patient declined    Pre-existing out of facility DNR order (yellow form or pink MOST form)       No      Data saved with a previous flowsheet row definition    Current Medications (verified) Outpatient Encounter Medications as of 01/09/2024  Medication Sig   CRANBERRY PO Take by mouth daily.   D-MANNOSE PO Take by mouth daily.   estradiol  (ESTRACE ) 0.1 MG/GM vaginal cream Estrogen Cream Instruction Discard applicator Apply pea sized amount to tip of finger to urethra before bed. Wash hands well after application. Use Monday, Wednesday and Friday   fluticasone  (FLONASE ) 50 MCG/ACT nasal spray Place 2 sprays into both nostrils daily.   folic acid  (FOLVITE ) 1 MG tablet Take 1 tablet (1 mg total) by mouth daily.   golimumab  (SIMPONI  ARIA) 50 MG/4ML SOLN injection 2mg /kg   levothyroxine  (SYNTHROID ) 75 MCG tablet Take 1 tablet (75 mcg total) by mouth daily.   lisinopril  (ZESTRIL ) 5 MG tablet Take 1 tablet daily   methotrexate  2.5 MG tablet Take by mouth See admin instructions. Take 7 tablets weekly on the same day. Sunday   Multiple Vitamins-Minerals (PRESERVISION AREDS PO) Take by mouth.   omeprazole  (PRILOSEC) 40 MG capsule Take 1 capsule (40 mg total) by mouth daily.   predniSONE  (DELTASONE ) 10 MG tablet Take 1 tablet (10 mg total) by mouth daily with breakfast.   Probiotic Product (PROBIOTIC DAILY  PO) Take by mouth daily.   traMADol  (ULTRAM ) 50 MG tablet Take 1 tablet (50 mg total) by mouth daily as needed for severe pain (pain score 7-10).   Vitamin D , Ergocalciferol , (DRISDOL ) 1.25 MG (50000 UNIT) CAPS capsule Take 1 capsule (50,000 Units total) by mouth once a week.   gabapentin  (NEURONTIN ) 100 MG capsule Take 1 capsule (100 mg total) by mouth at bedtime. (Patient not taking: Reported on 01/09/2024)   No facility-administered encounter medications on file as of 01/09/2024.    Allergies (verified) Patient has no known allergies.    History: Past Medical History:  Diagnosis Date   Colon cancer, ascending (HCC)    hx of   Hx of breast cancer    Hypertension    Hypothyroidism    Past Surgical History:  Procedure Laterality Date   BREAST EXCISIONAL BIOPSY Right    COLONOSCOPY N/A 12/11/2012   Procedure: COLONOSCOPY;  Surgeon: Claudis RAYMOND Rivet, MD;  Location: [facility];  Service: Endoscopy;  Laterality: N/A;  1030   COLONOSCOPY N/A 07/09/2017   Procedure: COLONOSCOPY;  Surgeon: Mavis Anes, MD;  Location: [facility];  Service: Gastroenterology;  Laterality: N/A;   MASTECTOMY Left    RIGHT COLECTOMY     Family History  Problem Relation Age of Onset   Colon cancer Neg Hx    Breast cancer Neg Hx    Social History   Socioeconomic History   Marital status: Widowed    Spouse name: Not on file   Number of children: Not on file   Years of education: Not on file   Highest education level: Not on file  Occupational History   Not on file  Tobacco Use   Smoking status: Never   Smokeless tobacco: Never  Vaping Use   Vaping status: Never Used  Substance and Sexual Activity   Alcohol  use: No   Drug use: No   Sexual activity: Not on file  Other Topics Concern   Not on file  Social History Narrative   Not on file   Social Drivers of Health   Financial Resource Strain: Low Risk  (01/09/2024)   Overall Financial Resource Strain (CARDIA)    Difficulty of Paying Living Expenses: Not hard at all  Food Insecurity: No Food Insecurity (01/09/2024)   Hunger Vital Sign    Worried About Running Out of Food in the Last Year: Never true    Ran Out of Food in the Last Year: Never true  Transportation Needs: No Transportation Needs (01/09/2024)   PRAPARE - Administrator, Civil Service (Medical): No    Lack of Transportation (Non-Medical): No  Physical Activity: Sufficiently Active (01/09/2024)   Exercise Vital Sign    Days of Exercise per Week: 7 days    Minutes of Exercise per Session: 30 min   Stress: No Stress Concern Present (01/09/2024)   [employer] of Occupational Health - Occupational Stress Questionnaire    Feeling of Stress: Not at all  Social Connections: Socially Isolated (01/09/2024)   Social Connection and Isolation Panel    Frequency of Communication with Friends and Family: More than three times a week    Frequency of Social Gatherings with Friends and Family: More than three times a week    Attends Religious Services: Never    Database administrator or Organizations: No    Attends Banker Meetings: Never    Marital Status: Widowed    Tobacco Counseling Counseling given: Not Answered  Clinical Intake:  Pre-visit preparation completed: Yes  Pain : 0-10 Pain Score: 2  Pain Type: Chronic pain Pain Location: Back Pain Orientation: Left     BMI - recorded: 29 Nutritional Risks: None Diabetes: No  How often do you need to have someone help you when you read instructions, pamphlets, or other written materials from your doctor or pharmacy?: 2 - Rarely What is the last grade level you completed in school?: bachelors  Interpreter Needed?: No  Information entered by :: Arnette Hoots, CMA   Activities of Daily Living    01/09/2024   10:53 AM 01/31/2023    1:20 PM  In your present state of health, do you have any difficulty performing the following activities:  Hearing? 1 1  Vision? 0 1  Difficulty concentrating or making decisions? 0 0  Walking or climbing stairs? 1 1  Dressing or bathing? 0 0  Doing errands, shopping? 1 1  Preparing Food and eating ? N   Using the Toilet? N   In the past six months, have you accidently leaked urine? N   Do you have problems with loss of bowel control? N   Managing your Medications? N   Managing your Finances? N   Housekeeping or managing your Housekeeping? N     Patient Care Team: Gareth Mliss FALCON, FNP as PCP - General (Nurse Practitioner)  Indicate any recent Medical Services you may  have received from other than [facility] providers in the past year (date may be approximate).     Assessment:   This is a routine wellness examination for [place].  Hearing/Vision screen Hearing Screening - Comments:: Slight issue Vision Screening - Comments:: No issues   Goals Addressed             This Visit's Progress    Patient Stated       Get to walking without walker       Depression Screen    01/09/2024   11:01 AM 01/09/2024   10:57 AM 08/07/2023    2:34 PM 01/31/2023    1:20 PM 01/14/2023    2:27 PM 01/03/2023   10:56 AM 01/03/2023    9:52 AM  PHQ 2/9 Scores  PHQ - 2 Score 0 0 0 0 0 0 0  PHQ- 9 Score   0  0      Fall Risk    01/09/2024   11:00 AM 08/07/2023    2:34 PM 01/31/2023    1:19 PM 01/14/2023    2:27 PM 01/03/2023   10:52 AM  Fall Risk   Falls in the past year? 0 1 1 1 1   Number falls in past yr: 0 0 1 1 1   Injury with Fall? 0 0 1 1 0  Risk for fall due to : No Fall Risks Impaired mobility Impaired vision;Impaired mobility;Impaired balance/gait;History of fall(s) Impaired balance/gait No Fall Risks;Impaired balance/gait;Impaired mobility;History of fall(s);Orthopedic patient  Follow up Falls evaluation completed;Education provided Falls evaluation completed Falls evaluation completed Falls prevention discussed;Education provided;Falls evaluation completed Education provided    MEDICARE RISK AT HOME: Medicare Risk at Home Any stairs in or around the home?: No If so, are there any without handrails?: No Home free of loose throw rugs in walkways, pet beds, electrical cords, etc?: No Adequate lighting in your home to reduce risk of falls?: Yes Life alert?: No Use of a cane, walker or w/c?: Yes Grab bars in the bathroom?: Yes Shower chair or bench in shower?: Yes Elevated toilet seat  or a handicapped toilet?: Yes  TIMED UP AND GO:  Was the test performed?  No    Cognitive Function:        01/09/2024   11:01 AM 01/03/2023   11:02 AM  6CIT Screen   What Year? 0 points 0 points  What month? 0 points 0 points  What time? 0 points 0 points  Count back from 20  0 points  Months in reverse 0 points 0 points  Repeat phrase 0 points 0 points  Total Score  0 points    Immunizations Immunization History  Administered Date(s) Administered   Influenza,inj,Quad PF,6-35 Mos 12/31/2022   Influenza-Unspecified 12/31/2012, 12/31/2017, 01/01/2023    TDAP status: Up to date  Flu Vaccine status: Due, Education has been provided regarding the importance of this vaccine. Advised may receive this vaccine at local pharmacy or Health Dept. Aware to provide a copy of the vaccination record if obtained from local pharmacy or Health Dept. Verbalized acceptance and understanding.  Pneumococcal vaccine status: Up to date  Covid-19 vaccine status: Declined, Education has been provided regarding the importance of this vaccine but patient still declined. Advised may receive this vaccine at local pharmacy or Health Dept.or vaccine clinic. Aware to provide a copy of the vaccination record if obtained from local pharmacy or Health Dept. Verbalized acceptance and understanding.  Qualifies for Shingles Vaccine? Yes   Zostavax completed Yes   Shingrix Completed?: Yes  Screening Tests Health Maintenance  Topic Date Due   COVID-19 Vaccine (1) Never done   DTaP/Tdap/Td (1 - Tdap) Never done   Zoster Vaccines- Shingrix (1 of 2) Never done   Pneumococcal Vaccine: 50+ Years (1 of 1 - PCV) Never done   Influenza Vaccine  11/01/2023   Medicare Annual Wellness (AWV)  01/03/2024   DEXA SCAN  Completed   Meningococcal B Vaccine  Aged Out    Health Maintenance  Health Maintenance Due  Topic Date Due   COVID-19 Vaccine (1) Never done   DTaP/Tdap/Td (1 - Tdap) Never done   Zoster Vaccines- Shingrix (1 of 2) Never done   Pneumococcal Vaccine: 50+ Years (1 of 1 - PCV) Never done   Influenza Vaccine  11/01/2023   Medicare Annual Wellness (AWV)  01/03/2024     Colorectal cancer screening: Type of screening: Colonoscopy. Completed 12/11/2012. Repeat every N/A years D/C due to age  Mammogram status: Completed 02/17/2023. Repeat every year  Bone Density status: Completed 02/01/2020. Results reflect: Bone density results: OSTEOPOROSIS. Repeat every 2 years.  Lung Cancer Screening: (Low Dose CT Chest recommended if Age 73-80 years, 20 pack-year currently smoking OR have quit w/in 15years.) does not qualify.   Lung Cancer Screening Referral: n/a  Additional Screening:  Hepatitis C Screening: does qualify; Completed 02/20/2012  Vision Screening: Recommended annual ophthalmology exams for early detection of glaucoma and other disorders of the eye. Is the patient up to date with their annual eye exam?  Yes  Who is the provider or what is the name of the office in which the patient attends annual eye exams? Dr Mevelyn If pt is not established with a provider, would they like to be referred to a provider to establish care? No .   Dental Screening: Recommended annual dental exams for proper oral hygiene    Community Resource Referral / Chronic Care Management: CRR required this visit?  No   CCM required this visit?  No     Plan:     I have personally reviewed and  noted the following in the patient's chart:   Medical and social history Use of alcohol , tobacco or illicit drugs  Current medications and supplements including opioid prescriptions. Patient is not currently taking opioid prescriptions. Functional ability and status Nutritional status Physical activity Advanced directives List of other physicians Hospitalizations, surgeries, and ER visits in previous 12 months Vitals Screenings to include cognitive, depression, and falls Referrals and appointments  In addition, I have reviewed and discussed with patient certain preventive protocols, quality metrics, and best practice recommendations. A written personalized care plan for  preventive services as well as general preventive health recommendations were provided to patient.     Arnette LOISE Hoots, CMA   01/09/2024   After Visit Summary: (Mail) Due to this being a telephonic visit, the after visit summary with patients personalized plan was offered to patient via mail   Nurse Notes: Patient states that she would like to work toward not having to use her walker to walk. She is not in need of any refills at this time. She is scheduled to get flu shot tomorrow.

## 2024-01-10 DIAGNOSIS — Z23 Encounter for immunization: Secondary | ICD-10-CM | POA: Diagnosis not present

## 2024-01-15 ENCOUNTER — Emergency Department

## 2024-01-15 ENCOUNTER — Inpatient Hospital Stay
Admission: EM | Admit: 2024-01-15 | Discharge: 2024-01-20 | DRG: 092 | Disposition: A | Discharge status: Skilled Nursing Facility | Attending: Internal Medicine | Admitting: Internal Medicine

## 2024-01-15 ENCOUNTER — Other Ambulatory Visit: Payer: Self-pay

## 2024-01-15 DIAGNOSIS — M4855XD Collapsed vertebra, not elsewhere classified, thoracolumbar region, subsequent encounter for fracture with routine healing: Secondary | ICD-10-CM | POA: Diagnosis not present

## 2024-01-15 DIAGNOSIS — M5416 Radiculopathy, lumbar region: Secondary | ICD-10-CM | POA: Diagnosis present

## 2024-01-15 DIAGNOSIS — Z66 Do not resuscitate: Secondary | ICD-10-CM | POA: Diagnosis present

## 2024-01-15 DIAGNOSIS — R700 Elevated erythrocyte sedimentation rate: Secondary | ICD-10-CM | POA: Diagnosis present

## 2024-01-15 DIAGNOSIS — M5136 Other intervertebral disc degeneration, lumbar region with discogenic back pain only: Secondary | ICD-10-CM | POA: Diagnosis not present

## 2024-01-15 DIAGNOSIS — R109 Unspecified abdominal pain: Secondary | ICD-10-CM | POA: Diagnosis not present

## 2024-01-15 DIAGNOSIS — E7800 Pure hypercholesterolemia, unspecified: Secondary | ICD-10-CM | POA: Diagnosis present

## 2024-01-15 DIAGNOSIS — S32000A Wedge compression fracture of unspecified lumbar vertebra, initial encounter for closed fracture: Secondary | ICD-10-CM | POA: Diagnosis not present

## 2024-01-15 DIAGNOSIS — M4856XA Collapsed vertebra, not elsewhere classified, lumbar region, initial encounter for fracture: Secondary | ICD-10-CM | POA: Diagnosis present

## 2024-01-15 DIAGNOSIS — Z85038 Personal history of other malignant neoplasm of large intestine: Secondary | ICD-10-CM

## 2024-01-15 DIAGNOSIS — K5730 Diverticulosis of large intestine without perforation or abscess without bleeding: Secondary | ICD-10-CM | POA: Diagnosis not present

## 2024-01-15 DIAGNOSIS — Z9181 History of falling: Secondary | ICD-10-CM

## 2024-01-15 DIAGNOSIS — R52 Pain, unspecified: Secondary | ICD-10-CM | POA: Diagnosis not present

## 2024-01-15 DIAGNOSIS — M47816 Spondylosis without myelopathy or radiculopathy, lumbar region: Secondary | ICD-10-CM | POA: Diagnosis not present

## 2024-01-15 DIAGNOSIS — E559 Vitamin D deficiency, unspecified: Secondary | ICD-10-CM | POA: Diagnosis not present

## 2024-01-15 DIAGNOSIS — Z602 Problems related to living alone: Secondary | ICD-10-CM | POA: Diagnosis present

## 2024-01-15 DIAGNOSIS — M25552 Pain in left hip: Secondary | ICD-10-CM | POA: Diagnosis not present

## 2024-01-15 DIAGNOSIS — J321 Chronic frontal sinusitis: Secondary | ICD-10-CM | POA: Diagnosis present

## 2024-01-15 DIAGNOSIS — G8929 Other chronic pain: Secondary | ICD-10-CM | POA: Diagnosis not present

## 2024-01-15 DIAGNOSIS — M1612 Unilateral primary osteoarthritis, left hip: Secondary | ICD-10-CM | POA: Diagnosis present

## 2024-01-15 DIAGNOSIS — M069 Rheumatoid arthritis, unspecified: Secondary | ICD-10-CM | POA: Diagnosis not present

## 2024-01-15 DIAGNOSIS — M545 Low back pain, unspecified: Secondary | ICD-10-CM

## 2024-01-15 DIAGNOSIS — K219 Gastro-esophageal reflux disease without esophagitis: Secondary | ICD-10-CM | POA: Diagnosis present

## 2024-01-15 DIAGNOSIS — Z853 Personal history of malignant neoplasm of breast: Secondary | ICD-10-CM | POA: Diagnosis not present

## 2024-01-15 DIAGNOSIS — M48061 Spinal stenosis, lumbar region without neurogenic claudication: Secondary | ICD-10-CM | POA: Diagnosis not present

## 2024-01-15 DIAGNOSIS — Z79631 Long term (current) use of antimetabolite agent: Secondary | ICD-10-CM

## 2024-01-15 DIAGNOSIS — M4856XD Collapsed vertebra, not elsewhere classified, lumbar region, subsequent encounter for fracture with routine healing: Secondary | ICD-10-CM | POA: Diagnosis not present

## 2024-01-15 DIAGNOSIS — M948X5 Other specified disorders of cartilage, thigh: Secondary | ICD-10-CM | POA: Diagnosis not present

## 2024-01-15 DIAGNOSIS — Z79899 Other long term (current) drug therapy: Secondary | ICD-10-CM

## 2024-01-15 DIAGNOSIS — M8580 Other specified disorders of bone density and structure, unspecified site: Secondary | ICD-10-CM | POA: Diagnosis present

## 2024-01-15 DIAGNOSIS — Z604 Social exclusion and rejection: Secondary | ICD-10-CM | POA: Diagnosis present

## 2024-01-15 DIAGNOSIS — I10 Essential (primary) hypertension: Secondary | ICD-10-CM | POA: Diagnosis present

## 2024-01-15 DIAGNOSIS — M4854XD Collapsed vertebra, not elsewhere classified, thoracic region, subsequent encounter for fracture with routine healing: Secondary | ICD-10-CM | POA: Diagnosis not present

## 2024-01-15 DIAGNOSIS — Z9012 Acquired absence of left breast and nipple: Secondary | ICD-10-CM | POA: Diagnosis not present

## 2024-01-15 DIAGNOSIS — M4855XA Collapsed vertebra, not elsewhere classified, thoracolumbar region, initial encounter for fracture: Secondary | ICD-10-CM | POA: Diagnosis not present

## 2024-01-15 DIAGNOSIS — E538 Deficiency of other specified B group vitamins: Secondary | ICD-10-CM | POA: Diagnosis not present

## 2024-01-15 DIAGNOSIS — E039 Hypothyroidism, unspecified: Secondary | ICD-10-CM | POA: Diagnosis not present

## 2024-01-15 DIAGNOSIS — S32010A Wedge compression fracture of first lumbar vertebra, initial encounter for closed fracture: Secondary | ICD-10-CM

## 2024-01-15 DIAGNOSIS — Z79890 Hormone replacement therapy (postmenopausal): Secondary | ICD-10-CM | POA: Diagnosis not present

## 2024-01-15 DIAGNOSIS — M47817 Spondylosis without myelopathy or radiculopathy, lumbosacral region: Secondary | ICD-10-CM | POA: Diagnosis not present

## 2024-01-15 DIAGNOSIS — Z7952 Long term (current) use of systemic steroids: Secondary | ICD-10-CM

## 2024-01-15 DIAGNOSIS — I708 Atherosclerosis of other arteries: Secondary | ICD-10-CM | POA: Diagnosis not present

## 2024-01-15 DIAGNOSIS — I700 Atherosclerosis of aorta: Secondary | ICD-10-CM | POA: Diagnosis not present

## 2024-01-15 DIAGNOSIS — M4807 Spinal stenosis, lumbosacral region: Secondary | ICD-10-CM | POA: Diagnosis not present

## 2024-01-15 DIAGNOSIS — D849 Immunodeficiency, unspecified: Secondary | ICD-10-CM | POA: Diagnosis present

## 2024-01-15 DIAGNOSIS — M4726 Other spondylosis with radiculopathy, lumbar region: Secondary | ICD-10-CM | POA: Diagnosis not present

## 2024-01-15 DIAGNOSIS — R262 Difficulty in walking, not elsewhere classified: Secondary | ICD-10-CM | POA: Diagnosis present

## 2024-01-15 DIAGNOSIS — R54 Age-related physical debility: Secondary | ICD-10-CM | POA: Diagnosis present

## 2024-01-15 DIAGNOSIS — M129 Arthropathy, unspecified: Secondary | ICD-10-CM | POA: Diagnosis not present

## 2024-01-15 DIAGNOSIS — K449 Diaphragmatic hernia without obstruction or gangrene: Secondary | ICD-10-CM | POA: Diagnosis not present

## 2024-01-15 DIAGNOSIS — Z79620 Long term (current) use of immunosuppressive biologic: Secondary | ICD-10-CM

## 2024-01-15 DIAGNOSIS — M4854XA Collapsed vertebra, not elsewhere classified, thoracic region, initial encounter for fracture: Secondary | ICD-10-CM | POA: Diagnosis present

## 2024-01-15 DIAGNOSIS — S22080A Wedge compression fracture of T11-T12 vertebra, initial encounter for closed fracture: Secondary | ICD-10-CM | POA: Diagnosis not present

## 2024-01-15 DIAGNOSIS — M4850XA Collapsed vertebra, not elsewhere classified, site unspecified, initial encounter for fracture: Secondary | ICD-10-CM

## 2024-01-15 DIAGNOSIS — K8020 Calculus of gallbladder without cholecystitis without obstruction: Secondary | ICD-10-CM | POA: Diagnosis not present

## 2024-01-15 DIAGNOSIS — M419 Scoliosis, unspecified: Secondary | ICD-10-CM | POA: Diagnosis not present

## 2024-01-15 LAB — CBC
HCT: 32.4 % — ABNORMAL LOW (ref 36.0–46.0)
HCT: 34.2 % — ABNORMAL LOW (ref 36.0–46.0)
Hemoglobin: 10.3 g/dL — ABNORMAL LOW (ref 12.0–15.0)
Hemoglobin: 11 g/dL — ABNORMAL LOW (ref 12.0–15.0)
MCH: 29.9 pg (ref 26.0–34.0)
MCH: 30 pg (ref 26.0–34.0)
MCHC: 31.8 g/dL (ref 30.0–36.0)
MCHC: 32.2 g/dL (ref 30.0–36.0)
MCV: 94.2 fL (ref 80.0–100.0)
MCV: 93.2 fL (ref 80.0–100.0)
Platelets: 236 K/uL (ref 150–400)
Platelets: 283 K/uL (ref 150–400)
RBC: 3.44 MIL/uL — ABNORMAL LOW (ref 3.87–5.11)
RBC: 3.67 MIL/uL — ABNORMAL LOW (ref 3.87–5.11)
RDW: 15.9 % — ABNORMAL HIGH (ref 11.5–15.5)
RDW: 15.8 % — ABNORMAL HIGH (ref 11.5–15.5)
WBC: 9.1 K/uL (ref 4.0–10.5)
WBC: 10.2 K/uL (ref 4.0–10.5)
nRBC: 0 % (ref 0.0–0.2)
nRBC: 0 % (ref 0.0–0.2)

## 2024-01-15 LAB — COMPREHENSIVE METABOLIC PANEL WITH GFR
ALT: 13 U/L (ref 0–44)
AST: 26 U/L (ref 15–41)
Albumin: 3.7 g/dL (ref 3.5–5.0)
Alkaline Phosphatase: 92 U/L (ref 38–126)
Anion gap: 12 (ref 5–15)
BUN: 17 mg/dL (ref 8–23)
CO2: 25 mmol/L (ref 22–32)
Calcium: 9.4 mg/dL (ref 8.9–10.3)
Chloride: 101 mmol/L (ref 98–111)
Creatinine, Ser: 1.06 mg/dL — ABNORMAL HIGH (ref 0.44–1.00)
GFR, Estimated: 51 mL/min — ABNORMAL LOW (ref >60)
Glucose, Bld: 124 mg/dL — ABNORMAL HIGH (ref 70–99)
Potassium: 4.5 mmol/L (ref 3.5–5.1)
Sodium: 138 mmol/L (ref 135–145)
Total Bilirubin: 0.6 mg/dL (ref 0.0–1.2)
Total Protein: 7 g/dL (ref 6.5–8.1)

## 2024-01-15 LAB — URINALYSIS, COMPLETE (UACMP) WITH MICROSCOPIC
Bilirubin Urine: NEGATIVE
Glucose, UA: NEGATIVE mg/dL
Hgb urine dipstick: NEGATIVE
Ketones, ur: NEGATIVE mg/dL
Nitrite: NEGATIVE
Protein, ur: NEGATIVE mg/dL
Specific Gravity, Urine: 1.008 (ref 1.005–1.030)
pH: 7 (ref 5.0–8.0)

## 2024-01-15 LAB — SEDIMENTATION RATE: Sed Rate: 36 mm/h — ABNORMAL HIGH (ref 0–30)

## 2024-01-15 LAB — C-REACTIVE PROTEIN: CRP: 0.6 mg/dL (ref <1.0)

## 2024-01-15 MED ORDER — ONDANSETRON HCL 4 MG/2ML IJ SOLN
4.0000 mg | Freq: Four times a day (QID) | INTRAMUSCULAR | Status: DC | PRN
  Filled 2024-01-15: qty 2

## 2024-01-15 MED ORDER — FENTANYL CITRATE (PF) 50 MCG/ML IJ SOSY
50.0000 ug | PREFILLED_SYRINGE | Freq: Once | INTRAMUSCULAR | Status: AC
  Administered 2024-01-15: 50 ug via INTRAVENOUS
  Filled 2024-01-15: qty 1

## 2024-01-15 MED ORDER — VITAMIN B-12 1000 MCG PO TABS
1000.0000 ug | ORAL_TABLET | Freq: Every day | ORAL | Status: DC
  Administered 2024-01-16 – 2024-01-20 (×5): 1000 ug via ORAL
  Filled 2024-01-15: qty 2
  Filled 2024-01-15 (×4): qty 1

## 2024-01-15 MED ORDER — FENTANYL CITRATE (PF) 50 MCG/ML IJ SOSY
25.0000 ug | PREFILLED_SYRINGE | INTRAMUSCULAR | Status: AC | PRN
  Administered 2024-01-16: 25 ug via INTRAVENOUS
  Filled 2024-01-15: qty 1

## 2024-01-15 MED ORDER — BISACODYL 5 MG PO TBEC: 5.0000 mg | DELAYED_RELEASE_TABLET | Freq: Every day | ORAL | Status: DC | PRN

## 2024-01-15 MED ORDER — MORPHINE SULFATE (PF) 4 MG/ML IV SOLN
4.0000 mg | INTRAVENOUS | Status: AC | PRN
  Administered 2024-01-15 – 2024-01-16 (×2): 4 mg via INTRAVENOUS
  Filled 2024-01-15 (×2): qty 1

## 2024-01-15 MED ORDER — ACETAMINOPHEN 650 MG RE SUPP: 650.0000 mg | Freq: Four times a day (QID) | RECTAL | Status: DC | PRN

## 2024-01-15 MED ORDER — SENNOSIDES-DOCUSATE SODIUM 8.6-50 MG PO TABS
1.0000 | ORAL_TABLET | Freq: Two times a day (BID) | ORAL | Status: AC
Start: 2024-01-15 — End: 2024-01-19
  Administered 2024-01-16 – 2024-01-19 (×7): 1 via ORAL
  Filled 2024-01-15 (×7): qty 1

## 2024-01-15 MED ORDER — PANTOPRAZOLE SODIUM 40 MG PO TBEC
80.0000 mg | DELAYED_RELEASE_TABLET | Freq: Every day | ORAL | Status: DC
  Administered 2024-01-16 – 2024-01-20 (×5): 80 mg via ORAL
  Filled 2024-01-15 (×5): qty 2

## 2024-01-15 MED ORDER — HYDRALAZINE HCL 20 MG/ML IJ SOLN: 5.0000 mg | Freq: Four times a day (QID) | INTRAMUSCULAR | Status: DC | PRN

## 2024-01-15 MED ORDER — FOLIC ACID 1 MG PO TABS
1.0000 mg | ORAL_TABLET | Freq: Every day | ORAL | Status: DC
  Administered 2024-01-16 – 2024-01-20 (×5): 1 mg via ORAL
  Filled 2024-01-15 (×6): qty 1

## 2024-01-15 MED ORDER — TRAMADOL HCL 50 MG PO TABS: 50.0000 mg | ORAL_TABLET | Freq: Every day | ORAL | Status: DC | PRN

## 2024-01-15 MED ORDER — TRAMADOL HCL 50 MG PO TABS
50.0000 mg | ORAL_TABLET | Freq: Four times a day (QID) | ORAL | Status: DC | PRN
  Administered 2024-01-16 – 2024-01-19 (×7): 50 mg via ORAL
  Filled 2024-01-15 (×7): qty 1

## 2024-01-15 MED ORDER — HEPARIN SODIUM (PORCINE) 5000 UNIT/ML IJ SOLN
5000.0000 [IU] | Freq: Three times a day (TID) | INTRAMUSCULAR | Status: DC
  Administered 2024-01-15 – 2024-01-20 (×14): 5000 [IU] via SUBCUTANEOUS
  Filled 2024-01-15 (×14): qty 1

## 2024-01-15 MED ORDER — PREDNISONE 10 MG PO TABS
10.0000 mg | ORAL_TABLET | Freq: Every day | ORAL | Status: DC
  Administered 2024-01-16 – 2024-01-20 (×5): 10 mg via ORAL
  Filled 2024-01-15 (×5): qty 1

## 2024-01-15 MED ORDER — LISINOPRIL 10 MG PO TABS
5.0000 mg | ORAL_TABLET | Freq: Every day | ORAL | Status: DC
  Administered 2024-01-16 – 2024-01-20 (×5): 5 mg via ORAL
  Filled 2024-01-15 (×6): qty 1

## 2024-01-15 MED ORDER — LEVOTHYROXINE SODIUM 50 MCG PO TABS
75.0000 ug | ORAL_TABLET | Freq: Every day | ORAL | Status: DC
  Administered 2024-01-16 – 2024-01-20 (×5): 75 ug via ORAL
  Filled 2024-01-15: qty 1.5
  Filled 2024-01-15 (×3): qty 2
  Filled 2024-01-15: qty 1

## 2024-01-15 MED ORDER — ONDANSETRON HCL 4 MG PO TABS: 4.0000 mg | ORAL_TABLET | Freq: Four times a day (QID) | ORAL | Status: DC | PRN

## 2024-01-15 MED ORDER — ACETAMINOPHEN 325 MG PO TABS: 650.0000 mg | ORAL_TABLET | Freq: Four times a day (QID) | ORAL | Status: DC | PRN

## 2024-01-15 NOTE — ED Notes (Signed)
 Pt reports that she feels as if her brief is what. Pts brief changed, purewick back in place. Pt denies further needs.

## 2024-01-15 NOTE — H&P (Addendum)
 History and Physical   Wanda Cohen FMW:[id number] DOB: 04/17/1934 DOA: 01/15/2024  PCP: Gareth Mliss FALCON, FNP  Patient coming from: [facility] independent living  I have personally briefly reviewed patient's old medical records in [facility] EMR.  Chief Concern: right hip pain, no trauma  HPI: Wanda Cohen is a 89 year old female with history of hypertension, hypothyroid, rheumatoid arthritis, GERD, who presents ED for chief concerns of right hip pain.  Vitals in the ED showed t 98.1, rr 17, hr 102, blood pressure 178/88, SpO2 of 98% on room air.  Serum sodium 138, potassium 4.5, chloride 101, bicarb 25, BUN of 17, serum creatinine 1.06, eGFR 51, WBC 9.1, hemoglobin 10.3, platelets of 236.  ED treatment: Fentanyl  50 mcg IV one-time dose. ----------------------------------- At bedside, patient was able to tell me her first and last name, age, location, current calendar year.  She reports she has had ongoing back pain however today, she developed so much pain that she has difficulty ambulating.  She can shuffle however this still causes her excruciating pain at her left leg.  She denies numbness down her legs.  She denies trauma including falling, anything falling on top of her, anybody hitting her or pushing her, she did not hit on anything.  She denies fever, chills, nausea, vomiting, diarrhea  Social history: She is lives at [facility] independent living.  ROS: Constitutional: no weight change, no fever ENT/Mouth: no sore throat, no rhinorrhea Eyes: no eye pain, no vision changes Cardiovascular: no chest pain, no dyspnea,  no edema, no palpitations Respiratory: no cough, no sputum, no wheezing Gastrointestinal: no nausea, no vomiting, no diarrhea, no constipation Genitourinary: no urinary incontinence, no dysuria, no hematuria Musculoskeletal: no arthralgias, no myalgias, + left hip pain Skin: no skin lesions, no pruritus, Neuro: + weakness, no loss of  consciousness, no syncope Psych: no anxiety, no depression, no decrease appetite Heme/Lymph: no bruising, no bleeding  ED Course: Discussed with EDP, patient requiring hospitalization for chief concerns of pain control and possible septic joint pending crp and sed rate.   Assessment/Plan  Principal Problem:   Inadequate pain control Active Problems:   Immunocompromised patient   Rheumatoid arthritis (HCC)   Hypothyroidism   Benign essential hypertension   Pure hypercholesterolemia   Gastroesophageal reflux disease without esophagitis   Chronic frontal sinusitis   At risk for falling   Assessment and Plan:  * Inadequate pain control Of the left hip in a patient who is immunocompromise Pain out of proportion to imaging and given no prior history of trauma unclear etiology at this time EDP discussed with Dr. Lorelle, who reviewed the CT imaging and does not think there is septic joint however recommended CRP, sed rate for inflammatory evaluation and if positive, Dr. Lorelle should be message as he will then determine further course of care at that time, possible plan may be aspiration of joint by himself or by IR with culture as appropriate Symptomatic support: Tramadol  50 mg every 6 hours as needed for moderate pain, morphine  4 mg IV every 4 hours as needed for severe pain; fentanyl  25 mcg IV every 4 hours as needed for severe pain not responsive to IV morphine , 20 hours of coverage ordered T, OT consulted for tomorrow  Immunocompromised patient Patient on chronic steroids secondary to rheumatoid arthritis and on methotrexate   At risk for falling PT, OT for tomorrow if evaluated by orthopedic surgeon stating no septic joint  Gastroesophageal reflux disease without esophagitis Home PPI equivalent resume  Benign essential hypertension Lisinopril  5 mg daily resumed Hydralazine 5 mg IV every 6 hours, prn for SBP > 165  Hypothyroidism Home levothyroxine  75 mcg daily  resume  Rheumatoid arthritis (HCC) Monoclonal antibody infusion to be resumed outpatient, methotrexate  not resumed on admission Prednisone  10 mg daily with breakfast resume  Chart reviewed.   DVT prophylaxis: Heparin 5000 units subcutaneous every 8 hours Code Status: DNR/DNI, allow for natural death per ACP review Diet: Heart healthy Family Communication: Updated son, Oneil and daughter Devere at bedside with patient's permission Disposition Plan: Pending clinical course Consults called: None at this time, a.m. team to consult orthopedic surgeon if inflammatory markers are positive due to possible septic joint Admission status: Telemetry medical, inpatient  Past Medical History:  Diagnosis Date   Colon cancer, ascending (HCC)    hx of   Hx of breast cancer    Hypertension    Hypothyroidism    Past Surgical History:  Procedure Laterality Date   BREAST EXCISIONAL BIOPSY Right    COLONOSCOPY N/A 12/11/2012   Procedure: COLONOSCOPY;  Surgeon: Claudis RAYMOND Rivet, MD;  Location: [facility];  Service: Endoscopy;  Laterality: N/A;  1030   COLONOSCOPY N/A 07/09/2017   Procedure: COLONOSCOPY;  Surgeon: Mavis Oneil, MD;  Location: [facility];  Service: Gastroenterology;  Laterality: N/A;   MASTECTOMY Left    RIGHT COLECTOMY     Social History:  reports that she has never smoked. She has never used smokeless tobacco. She reports that she does not drink alcohol  and does not use drugs.  No Known Allergies Family History  Problem Relation Age of Onset   Colon cancer Neg Hx    Breast cancer Neg Hx    Family history: Family history reviewed and not pertinent.  Prior to Admission medications   Medication Sig Start Date End Date Taking? Authorizing Provider  CRANBERRY PO Take by mouth daily.   Yes [provider]  cyanocobalamin  (VITAMIN B12) 1000 MCG tablet Take 1,000 mcg by mouth daily.   Yes [provider]  D-MANNOSE PO Take by mouth daily.   Yes [provider]  fluticasone  (FLONASE ) 50 MCG/ACT nasal spray Place 2 sprays into both nostrils daily. 01/01/22  Yes Pender, Julie F, FNP  folic acid  (FOLVITE ) 1 MG tablet Take 1 tablet (1 mg total) by mouth daily. 08/07/23  Yes Gareth Mliss FALCON, FNP  golimumab  (SIMPONI  ARIA) 50 MG/4ML SOLN injection Every 8 weeks   Yes [provider]  levothyroxine  (SYNTHROID ) 75 MCG tablet Take 1 tablet (75 mcg total) by mouth daily. 08/07/23  Yes Gareth Mliss FALCON, FNP  lisinopril  (ZESTRIL ) 5 MG tablet Take 1 tablet daily 08/07/23  Yes Pender, Julie F, FNP  methotrexate  2.5 MG tablet Take by mouth See admin instructions. Take 6 tablets weekly on the same day. Sunday   Yes [provider]  omeprazole  (PRILOSEC) 40 MG capsule Take 1 capsule (40 mg total) by mouth daily. 08/07/23  Yes Pender, Julie F, FNP  predniSONE  (DELTASONE ) 10 MG tablet Take 1 tablet (10 mg total) by mouth daily with breakfast. 10/31/23  Yes Harden Jerona GAILS, MD  Probiotic Product (PROBIOTIC DAILY PO) Take by mouth daily.   Yes [provider]  traMADol  (ULTRAM ) 50 MG tablet Take 1 tablet (50 mg total) by mouth daily as needed for severe pain (pain score 7-10). 11/15/23  Yes Gareth Mliss FALCON, FNP  Vitamin D , Ergocalciferol , (DRISDOL ) 1.25 MG (50000 UNIT) CAPS capsule Take 1 capsule (50,000 Units total)  by mouth once a week. Patient taking differently: Take 50,000 Units by mouth once a week. On Wednesday 08/07/23  Yes Gareth Mliss FALCON, FNP  estradiol  (ESTRACE ) 0.1 MG/GM vaginal cream Estrogen Cream Instruction Discard applicator Apply pea sized amount to tip of finger to urethra before bed. Wash hands well after application. Use Monday, Wednesday and Friday 02/27/23   Penne Knee, MD  gabapentin  (NEURONTIN ) 100 MG capsule Take 1 capsule (100 mg total) by mouth at bedtime. Patient not taking: Reported on 01/09/2024 11/14/23   Gareth Mliss FALCON, FNP  Multiple Vitamins-Minerals (PRESERVISION AREDS PO) Take by mouth. Patient not taking:  Reported on 01/15/2024    [provider]   Physical Exam: Vitals:   01/15/24 1623 01/15/24 1715 01/15/24 1733 01/15/24 1800  BP:   (!) 146/64 (!) 151/57  Pulse:  93 95 90  Resp:  15 18 13   Temp: 98.4 F (36.9 C)     TempSrc: Oral     SpO2:  98% 100% 99%   Constitutional: appears age-appropriate, frail Eyes: PERRL, lids and conjunctivae normal ENMT: Mucous membranes are moist. Posterior pharynx clear of any exudate or lesions. Age-appropriate dentition. Hearing appropriate Neck: normal, supple, no masses, no thyromegaly Respiratory: clear to auscultation bilaterally, no wheezing, no crackles. Normal respiratory effort. No accessory muscle use.  Cardiovascular: Regular rate and rhythm, no murmurs / rubs / gallops. No extremity edema. 2+ pedal pulses. No carotid bruits.  Abdomen: no tenderness, no masses palpated, no hepatosplenomegaly. Bowel sounds positive.  Musculoskeletal: no clubbing / cyanosis. No joint deformity upper and lower extremities.  Decreased ROM of the left lower extremity. no contractures, no atrophy. Normal muscle tone.  Skin: no rashes, lesions, ulcers. No induration Neurologic: Sensation intact.  Strength is 2 out of 5 of the left lower extremity.  Strength is normal in all other extremities. Psychiatric: Normal judgment and insight. Alert and oriented x 3. Normal mood.   EKG: not indicated at this time  X-ray on Admission: I personally reviewed and I agree with radiologist reading as below.  CT Hip Left Wo Contrast Result Date: 01/15/2024 EXAM: CT OF THE LEFT HIP WITHOUT IV CONTRAST 01/15/2024 01: 42:46 PM TECHNIQUE: CT of the left hip was performed without the administration of intravenous contrast. Multiplanar reformatted images are provided for review. Automated exposure control, iterative reconstruction, and/or weight based adjustment of the mA/kV was utilized to reduce the radiation dose to as low as reasonably achievable. COMPARISON: None available.  CLINICAL HISTORY: Hip pain, stress fracture suspected. FINDINGS: BONES: No acute fracture or dislocation. No aggressive appearing osseous abnormality or periostitis. Moderate craniocaudad and axial loss of articular cartilage thickness in the left hip with associated spurring of the left femoral head and acetabulum compatible with moderate degenerative hip arthropathy. SOFT TISSUE: Systemic atherosclerosis is present, including the aorta and iliac arteries. Sigmoid colon diverticulosis. Accentuated fluid density in the vaginal vault, significance uncertain. Correlate with any related symptoms. JOINT: Upper normal amount of fluid in the left hip joint. INTRAPELVIC CONTENTS: Limited images of the intrapelvic contents demonstrate sigmoid colon diverticulosis and accentuated fluid density in the vaginal vault, significance uncertain. Correlate with any related symptoms. IMPRESSION: 1. No acute fracture or bony injury. MRI can provide greater sensitivity for stress injuries and bone bruising. 2. Moderate left hip degenerative arthropathy with cartilage thinning and osteophytes. 3. Upper normal volume left hip joint fluid. 4. Systemic atherosclerosis involving the aorta and iliac arteries. 5. Sigmoid colon diverticulosis. 6. Accentuated fluid density in the vaginal vault; significance  uncertain. Electronically signed by: Ryan Salvage MD 01/15/2024 02:03 PM EDT RP Workstation: [id number]   CT Lumbar Spine Wo Contrast Result Date: 01/15/2024 EXAM: CT OF THE LUMBAR SPINE WITHOUT CONTRAST 01/15/2024 01:42:46 PM TECHNIQUE: CT of the lumbar spine was performed without the administration of intravenous contrast. Multiplanar reformatted images are provided for review. Automated exposure control, iterative reconstruction, and/or weight based adjustment of the mA/kV was utilized to reduce the radiation dose to as low as reasonably achievable. COMPARISON: None available. CLINICAL HISTORY: Low back pain. FINDINGS: BONES AND  ALIGNMENT: Interval 35 percent anterior compression fracture at T12. Interval 25 percent supraband plate compression fracture at L1. No significant bony retropulsion. Compression fracture likely subacute. Diffuse bony demineralization. Levoconvex lumbar scoliosis with rotary component. DEGENERATIVE CHANGES: Prominent loss of intervertebral disc space with vacuum disc phenomenon at all lumbar levels compatible with degenerative disc disease. Moderate left foraminal stenosis at L3-4, L4-5, and L5-S1 due to disc osteophyte complex and facet arthropathy. Moderate right foraminal stenosis at L5-S1 due to disc osteophyte complex and facet arthropathy. Moderate central stenosis at L4-5 due to disc bulge and facet arthropathy. SOFT TISSUES: Moderate sized hiatal hernia. Systemic atherosclerosis is present, including the aorta and iliac arteries. Sigmoid colon diverticulosis. IMPRESSION: 1. Interval 35% anterior compression fracture at T12 and 25% superior endplate compression fracture at L1, likely subacute, without significant bony retropulsion. 2. Moderate left foraminal stenosis at L3-4, L4-5, and L5-S1; moderate right foraminal stenosis at L5-S1; and moderate central stenosis at L4-5. 3. Prominent multilevel degenerative disc disease with vacuum disc phenomenon at all lumbar levels. 4. Levoconvex lumbar scoliosis with rotary component. Electronically signed by: Ryan Salvage MD 01/15/2024 01:58 PM EDT RP Workstation: [id number]   DG Hip Unilat With Pelvis 2-3 Views Left Result Date: 01/15/2024 CLINICAL DATA:  Left hip pain. EXAM: DG HIP (WITH OR WITHOUT PELVIS) 2-3V LEFT COMPARISON:  None Available. FINDINGS: Bones are diffusely demineralized. SI joints and symphysis pubis unremarkable. No evidence for an acute pubic ramus fracture. AP and cross-table lateral views of the left hip show no evidence for dislocation. No femoral neck fracture evident. IMPRESSION: 1. No acute bony findings. 2. Diffuse bony  demineralization. Electronically Signed   By: Camellia Candle M.D.   On: 01/15/2024 12:57   Labs on Admission: I have personally reviewed following labs  CBC: Recent Labs  Lab 01/15/24 1317 01/15/24 1622  WBC 9.1 10.2  HGB 10.3* 11.0*  HCT 32.4* 34.2*  MCV 94.2 93.2  PLT 236 283   Basic Metabolic Panel: Recent Labs  Lab 01/15/24 1317  NA 138  K 4.5  CL 101  CO2 25  GLUCOSE 124*  BUN 17  CREATININE 1.06*  CALCIUM  9.4   GFR: Estimated Creatinine Clearance: 32.1 mL/min (A) (by C-G formula based on SCr of 1.06 mg/dL (H)).  Liver Function Tests: Recent Labs  Lab 01/15/24 1317  AST 26  ALT 13  ALKPHOS 92  BILITOT 0.6  PROT 7.0  ALBUMIN 3.7   Urine analysis:    Component Value Date/Time   COLORURINE YELLOW 06/15/2021 1841   APPEARANCEUR Cloudy (A) 05/15/2023 1439   LABSPEC 1.014 06/15/2021 1841   PHURINE 8.0 06/15/2021 1841   GLUCOSEU Negative 05/15/2023 1439   HGBUR MODERATE (A) 06/15/2021 1841   BILIRUBINUR negative 08/27/2023 1519   BILIRUBINUR Negative 05/15/2023 1439   KETONESUR negative 08/27/2023 1519   KETONESUR NEGATIVE 06/15/2021 1841   PROTEINUR negative 08/27/2023 1519   PROTEINUR Negative 05/15/2023 1439   PROTEINUR 30 (A) 06/15/2021 1841  UROBILINOGEN 0.2 08/27/2023 1519   NITRITE Negative 08/27/2023 1519   NITRITE Negative 05/15/2023 1439   NITRITE NEGATIVE 06/15/2021 1841   LEUKOCYTESUR Small (1+) (A) 08/27/2023 1519   LEUKOCYTESUR 3+ (A) 05/15/2023 1439   LEUKOCYTESUR MODERATE (A) 06/15/2021 1841   This document was prepared using Dragon Voice Recognition software and may include unintentional dictation errors.  Dr. Sherre [facility]  If 7PM-7AM, please contact overnight-coverage provider If 7AM-7PM, please contact day attending provider [email or web address]  01/15/2024, 7:10 PM

## 2024-01-15 NOTE — ED Notes (Signed)
 Patient transported to X-ray

## 2024-01-15 NOTE — ED Notes (Signed)
 Patient transported to CT

## 2024-01-15 NOTE — Assessment & Plan Note (Signed)
 Monoclonal antibody infusion to be resumed outpatient, methotrexate  not resumed on admission Prednisone  10 mg daily with breakfast resume

## 2024-01-15 NOTE — ED Provider Notes (Signed)
 [facility] Provider Note    Event Date/Time   First MD Initiated Contact with Patient 01/15/24 1209     (approximate)   History   Hip Pain   HPI  Wanda Cohen is a 89 y.o. female past medical history significant for rheumatoid arthritis, osteopenia, who presents to the emergency department left hip pain.  Significant left hip pain that started yesterday.  Severe pain with mild range of motion of the left hip or with standing.  Recently diagnosed with a compression fracture to her upper back.  Denies any new falls or trauma.  No head injury or loss of consciousness.  Denies any radiation of the pain down her leg.  No fever or chills.     Physical Exam   Triage Vital Signs: ED Triage Vitals  Encounter Vitals Group     BP 01/15/24 1131 (!) 178/88     Girls Systolic BP Percentile --      Girls Diastolic BP Percentile --      Boys Systolic BP Percentile --      Boys Diastolic BP Percentile --      Pulse Rate 01/15/24 1131 (!) 102     Resp 01/15/24 1131 17     Temp 01/15/24 1131 98.1 F (36.7 C)     Temp Source 01/15/24 1131 Oral     SpO2 01/15/24 1131 98 %     Weight --      Height --      Head Circumference --      Peak Flow --      Pain Score 01/15/24 1130 8     Pain Loc --      Pain Education --      Exclude from Growth Chart --     Most recent vital signs: Vitals:   01/15/24 1800 01/15/24 1958  BP: (!) 151/57   Pulse: 90   Resp: 13   Temp:  97.8 F (36.6 C)  SpO2: 99%     Physical Exam Constitutional:      Appearance: She is well-developed.  HENT:     Head: Atraumatic.  Eyes:     Conjunctiva/sclera: Conjunctivae normal.  Cardiovascular:     Rate and Rhythm: Regular rhythm.  Pulmonary:     Effort: No respiratory distress.  Abdominal:     General: There is no distension.  Musculoskeletal:        General: Normal range of motion.     Cervical back: Normal range of motion.     Comments: Significant tenderness to  palpation with mild range of motion to the left leg.  Tenderness palpation to the lumbar spine.  +2 radial pulses and DP pulses that are equal bilaterally.  No surrounding erythema warmth or induration of the left hip.  Skin:    General: Skin is warm.  Neurological:     Mental Status: She is alert. Mental status is at baseline.     IMPRESSION / MDM / ASSESSMENT AND PLAN / ED COURSE  I reviewed the triage vital signs and the nursing notes.  Differential diagnosis including occult hip fracture, compression fracture, rheumatoid arthritis, arthritis, musculoskeletal pain.  No significant lower extremity weakness, no urinary or bowel incontinence have a low suspicion for cauda equina or epidural compression syndrome.  No falls or trauma.    No tachycardic or bradycardic dysrhythmias while on cardiac telemetry.  RADIOLOGY I independently reviewed imaging, my interpretation of imaging: X-ray of the left hip with no obvious  fracture or dislocation  CT scan of the hip with no acute fracture.  Moderate upper limit of normal hip joint fluid noted.  CT scan of the lumbar spine with compression fractures to T12 and L1  LABS (all labs ordered are listed, but only abnormal results are displayed) Labs interpreted as -    Labs Reviewed  CBC - Abnormal; Notable for the following components:      Result Value   RBC 3.44 (*)    Hemoglobin 10.3 (*)    HCT 32.4 (*)    RDW 15.9 (*)    All other components within normal limits  COMPREHENSIVE METABOLIC PANEL WITH GFR - Abnormal; Notable for the following components:   Glucose, Bld 124 (*)    Creatinine, Ser 1.06 (*)    GFR, Estimated 51 (*)    All other components within normal limits  SEDIMENTATION RATE - Abnormal; Notable for the following components:   Sed Rate 36 (*)    All other components within normal limits  CBC - Abnormal; Notable for the following components:   RBC 3.67 (*)    Hemoglobin 11.0 (*)    HCT 34.2 (*)    RDW 15.8 (*)     All other components within normal limits  C-REACTIVE PROTEIN  BASIC METABOLIC PANEL WITH GFR  CBC  URINALYSIS, COMPLETE (UACMP) WITH MICROSCOPIC     MDM  Discussed the patient's case with orthopedics Dr. Lorelle, discussed possibility of septic hip or possible occult hip fracture given her known compression fractures.  Recommended adding on ESR and CRP  and felt that the fluid in her hip on CT scan looked to be normal for her severe arthritis.  If elevated could reach out to orthopedics/IR for possible aspiration.  No significant white count or findings of an infection on her skin exam.  Consulted hospitalist for admission for lumbar and thoracic compression fractures and left hip pain.     PROCEDURES:  Critical Care performed: No  Procedures  Patient's presentation is most consistent with acute presentation with potential threat to life or bodily function.   MEDICATIONS ORDERED IN ED: Medications  levothyroxine  (SYNTHROID ) tablet 75 mcg (has no administration in time range)  folic acid  (FOLVITE ) tablet 1 mg (1 mg Oral Not Given 01/15/24 1735)  acetaminophen  (TYLENOL ) tablet 650 mg (has no administration in time range)    Or  acetaminophen  (TYLENOL ) suppository 650 mg (has no administration in time range)  ondansetron  (ZOFRAN ) tablet 4 mg (has no administration in time range)    Or  ondansetron  (ZOFRAN ) injection 4 mg (has no administration in time range)  heparin injection 5,000 Units (has no administration in time range)  bisacodyl (DULCOLAX) EC tablet 5 mg (has no administration in time range)  morphine  (PF) 4 MG/ML injection 4 mg (has no administration in time range)  fentaNYL  (SUBLIMAZE ) injection 25 mcg (has no administration in time range)  senna-docusate (Senokot-S) tablet 1 tablet (has no administration in time range)  hydrALAZINE (APRESOLINE) injection 5 mg (has no administration in time range)  lisinopril  (ZESTRIL ) tablet 5 mg (5 mg Oral Not Given 01/15/24 1734)   predniSONE  (DELTASONE ) tablet 10 mg (has no administration in time range)  pantoprazole (PROTONIX) EC tablet 80 mg (has no administration in time range)  cyanocobalamin  (VITAMIN B12) tablet 1,000 mcg (has no administration in time range)  traMADol  (ULTRAM ) tablet 50 mg (has no administration in time range)  fentaNYL  (SUBLIMAZE ) injection 50 mcg (50 mcg Intravenous Given 01/15/24 1314)  FINAL CLINICAL IMPRESSION(S) / ED DIAGNOSES   Final diagnoses:  Left hip pain  Compression fracture of L1 vertebra, initial encounter (HCC)  Compression fracture of T12 vertebra, initial encounter (HCC)     Rx / DC Orders   ED Discharge Orders     None        Note:  This document was prepared using Dragon voice recognition software and may include unintentional dictation errors.   Suzanne Kirsch, MD 01/15/24 2001

## 2024-01-15 NOTE — Assessment & Plan Note (Addendum)
 Pain out of proportion to imaging and given no prior history of trauma unclear etiology at this time.  Initially thought infection but it was ruled out, CRP normal and barely positive ESR with history of rheumatoid arthritis. IR and orthopedic surgery suggested MRI of lumbar spine for concern of lumbar radiculopathy and it did came back positive for multiple acute and subacute compression fractures.  Patient is high risk being on steroid.  -Neurosurgery was consulted -Continue with pain management and supportive care

## 2024-01-15 NOTE — ED Notes (Addendum)
 This tech assisted pt to restroom in triage. Pt able to stand pivot with one assist.

## 2024-01-15 NOTE — Assessment & Plan Note (Signed)
 Home levothyroxine  75 mcg daily resume

## 2024-01-15 NOTE — Assessment & Plan Note (Addendum)
 Lisinopril  5 mg daily resumed Hydralazine 5 mg IV every 6 hours, prn for SBP > 165

## 2024-01-15 NOTE — ED Notes (Signed)
 Patient placed on cardiac monitoring at this time. Family is at bedside. No needs stated at this time.

## 2024-01-15 NOTE — Hospital Course (Addendum)
 [name] is a 89 year old female with history of hypertension, hypothyroid, rheumatoid arthritis, GERD, who presents ED for chief concerns of right hip pain.  Vitals in the ED showed t 98.1, rr 17, hr 102, blood pressure 178/88, SpO2 of 98% on room air.  Serum sodium 138, potassium 4.5, chloride 101, bicarb 25, BUN of 17, serum creatinine 1.06, eGFR 51, WBC 9.1, hemoglobin 10.3, platelets of 236.  Multiple pelvic, lumbar spine and hip imaging, was negative for any acute fracture or bony injury, there was moderate left hip joint degenerative arthropathy, also shows a but normal volume left hip joint fluid, CRP normal with mildly elevated ESR.  IR was consulted to see if they can get some joint aspirate to rule out any septic arthritis although less likely.  ED treatment: Fentanyl  50 mcg IV one-time dose.  10/16: Hemodynamically stable, interventional radiology and orthopedic surgery evaluated her and they does not think that pain is coming from right hip, no concern of infection.  MRI of lumbar spine was obtained for concern of lumbar radiculopathy and it shows multiple compression fractures, acute and subacute involving lower thoracic and upper lumbar spine.  Neurosurgery was also consulted.  10/17: Remained hemodynamically stable, pain bearable with current regimen.  PT is recommending SNF-should be able to go to [facility] on Monday.  10/18: Remains stable, awaiting to go to SNF on Monday.  10/20: Remained hemodynamically stable.  Only have pain with ambulation but stating that it is slowly improving.  Patient does not want to wear TLSO brace as it is uncomfortable for her hernia.  She will continue on current medications and need to have a close follow-up with her providers for further assistance.

## 2024-01-15 NOTE — ED Triage Notes (Signed)
 PT reports the pain is in her left hip. She reports the pain became worse yesterday. No injury. Pt is AxOx4.

## 2024-01-15 NOTE — Assessment & Plan Note (Signed)
 Patient on chronic steroids secondary to rheumatoid arthritis and on methotrexate 

## 2024-01-15 NOTE — Assessment & Plan Note (Signed)
 Home PPI equivalent resume

## 2024-01-15 NOTE — Assessment & Plan Note (Addendum)
 PT, OT for tomorrow if evaluated by orthopedic surgeon stating no septic joint

## 2024-01-15 NOTE — ED Notes (Signed)
 Bed alarm on, fall risk bracelet on, shoes on, and call bell near pt.

## 2024-01-15 NOTE — ED Triage Notes (Signed)
 First Nurse Note: Patient to ED via [facility] from [facility] for right hip pain. Denies fall. Has known compression fx. Increased pain with movement. Ambulatory with assistance.   20 R forearm- 1g IV tylenol    193/93 97% RA 109 HR

## 2024-01-16 ENCOUNTER — Inpatient Hospital Stay

## 2024-01-16 DIAGNOSIS — E039 Hypothyroidism, unspecified: Secondary | ICD-10-CM

## 2024-01-16 DIAGNOSIS — M4850XA Collapsed vertebra, not elsewhere classified, site unspecified, initial encounter for fracture: Secondary | ICD-10-CM

## 2024-01-16 DIAGNOSIS — M5416 Radiculopathy, lumbar region: Secondary | ICD-10-CM

## 2024-01-16 DIAGNOSIS — S32010A Wedge compression fracture of first lumbar vertebra, initial encounter for closed fracture: Secondary | ICD-10-CM | POA: Diagnosis not present

## 2024-01-16 DIAGNOSIS — I10 Essential (primary) hypertension: Secondary | ICD-10-CM

## 2024-01-16 DIAGNOSIS — D849 Immunodeficiency, unspecified: Secondary | ICD-10-CM | POA: Diagnosis not present

## 2024-01-16 DIAGNOSIS — S22080A Wedge compression fracture of T11-T12 vertebra, initial encounter for closed fracture: Secondary | ICD-10-CM | POA: Diagnosis not present

## 2024-01-16 DIAGNOSIS — S32000A Wedge compression fracture of unspecified lumbar vertebra, initial encounter for closed fracture: Secondary | ICD-10-CM | POA: Diagnosis not present

## 2024-01-16 DIAGNOSIS — K219 Gastro-esophageal reflux disease without esophagitis: Secondary | ICD-10-CM

## 2024-01-16 DIAGNOSIS — M069 Rheumatoid arthritis, unspecified: Secondary | ICD-10-CM

## 2024-01-16 DIAGNOSIS — Z9181 History of falling: Secondary | ICD-10-CM | POA: Diagnosis not present

## 2024-01-16 LAB — CBC
HCT: 30.8 % — ABNORMAL LOW (ref 36.0–46.0)
Hemoglobin: 9.3 g/dL — ABNORMAL LOW (ref 12.0–15.0)
MCH: 29.4 pg (ref 26.0–34.0)
MCHC: 30.2 g/dL (ref 30.0–36.0)
MCV: 97.5 fL (ref 80.0–100.0)
Platelets: 225 K/uL (ref 150–400)
RBC: 3.16 MIL/uL — ABNORMAL LOW (ref 3.87–5.11)
RDW: 15.9 % — ABNORMAL HIGH (ref 11.5–15.5)
WBC: 10.6 K/uL — ABNORMAL HIGH (ref 4.0–10.5)
nRBC: 0 % (ref 0.0–0.2)

## 2024-01-16 LAB — BASIC METABOLIC PANEL WITH GFR
Anion gap: 15 (ref 5–15)
BUN: 15 mg/dL (ref 8–23)
CO2: 23 mmol/L (ref 22–32)
Calcium: 8.8 mg/dL — ABNORMAL LOW (ref 8.9–10.3)
Chloride: 105 mmol/L (ref 98–111)
Creatinine, Ser: 0.97 mg/dL (ref 0.44–1.00)
GFR, Estimated: 57 mL/min — ABNORMAL LOW (ref >60)
Glucose, Bld: 84 mg/dL (ref 70–99)
Potassium: 4.1 mmol/L (ref 3.5–5.1)
Sodium: 143 mmol/L (ref 135–145)

## 2024-01-16 MED ORDER — LIDOCAINE 5 % EX PTCH
2.0000 | MEDICATED_PATCH | CUTANEOUS | Status: DC
  Administered 2024-01-16 – 2024-01-19 (×4): 2 via TRANSDERMAL
  Filled 2024-01-16 (×4): qty 2

## 2024-01-16 NOTE — ED Notes (Signed)
 This RN at bedside with pt. Pt reports a decrease in pain and denies needing any further pain medication. Bed in lowest position and call bell within reach.

## 2024-01-16 NOTE — ED Notes (Signed)
 This tech cleaned the pt and placed a clean purewick

## 2024-01-16 NOTE — Consult Note (Signed)
 Consulting Department:  Internal medicine  Primary Physician:  Gareth Mliss FALCON, FNP  Chief Complaint: Left lower extremity pain  History of Present Illness: 01/16/2024 89 year old woman with history of hypertension, hypothyroid, rheumatoid arthritis, GERD and chronic back pain who developed left lateral hip pain.  No new trauma.  She states that her pain has gotten much better since being admitted.  She is not having any new numbness or tingling.  There is some radiation down the anterior lateral aspect of her left thigh not reaching all the way to the knee.  She is not having any new weakness.  No new bowel or bladder symptoms.  She states that she was previously offered spine surgery for degenerative spine disease with spinal deformities multiple compression fractures.  She has had prednisone  recently for treatment.  Review of Systems:  A 10 point review of systems is negative, except for the pertinent positives and negatives detailed in the HPI.  Past Medical History: Past Medical History:  Diagnosis Date   Colon cancer, ascending (HCC)    hx of   Hx of breast cancer    Hypertension    Hypothyroidism     Past Surgical History: Past Surgical History:  Procedure Laterality Date   BREAST EXCISIONAL BIOPSY Right    COLONOSCOPY N/A 12/11/2012   Procedure: COLONOSCOPY;  Surgeon: Claudis RAYMOND Rivet, MD;  Location: [facility];  Service: Endoscopy;  Laterality: N/A;  1030   COLONOSCOPY N/A 07/09/2017   Procedure: COLONOSCOPY;  Surgeon: Mavis Anes, MD;  Location: [facility];  Service: Gastroenterology;  Laterality: N/A;   MASTECTOMY Left    RIGHT COLECTOMY      Allergies: Allergies as of 01/15/2024   (No Known Allergies)    Medications:  Current Facility-Administered Medications:    acetaminophen  (TYLENOL ) tablet 650 mg, 650 mg, Oral, Q6H PRN **OR** acetaminophen  (TYLENOL ) suppository 650 mg, 650 mg, Rectal, Q6H PRN, Cox, Amy N, DO   bisacodyl (DULCOLAX) EC tablet 5 mg, 5  mg, Oral, Daily PRN, Cox, Amy N, DO   cyanocobalamin  (VITAMIN B12) tablet 1,000 mcg, 1,000 mcg, Oral, Daily, Cox, Amy N, DO, 1,000 mcg at 01/16/24 0906   folic acid  (FOLVITE ) tablet 1 mg, 1 mg, Oral, Daily, Cox, Amy N, DO, 1 mg at 01/16/24 0905   heparin injection 5,000 Units, 5,000 Units, Subcutaneous, Q8H, Cox, Amy N, DO, 5,000 Units at 01/16/24 1418   hydrALAZINE (APRESOLINE) injection 5 mg, 5 mg, Intravenous, Q6H PRN, Cox, Amy N, DO   levothyroxine  (SYNTHROID ) tablet 75 mcg, 75 mcg, Oral, Q0600, Cox, Amy N, DO, 75 mcg at 01/16/24 0524   lisinopril  (ZESTRIL ) tablet 5 mg, 5 mg, Oral, Daily, Cox, Amy N, DO, 5 mg at 01/16/24 0905   morphine  (PF) 4 MG/ML injection 4 mg, 4 mg, Intravenous, Q4H PRN, Cox, Amy N, DO, 4 mg at 01/16/24 1143   ondansetron  (ZOFRAN ) tablet 4 mg, 4 mg, Oral, Q6H PRN **OR** ondansetron  (ZOFRAN ) injection 4 mg, 4 mg, Intravenous, Q6H PRN, Cox, Amy N, DO   pantoprazole (PROTONIX) EC tablet 80 mg, 80 mg, Oral, Daily, Cox, Amy N, DO, 80 mg at 01/16/24 0905   predniSONE  (DELTASONE ) tablet 10 mg, 10 mg, Oral, Q breakfast, Cox, Amy N, DO, 10 mg at 01/16/24 9173   senna-docusate (Senokot-S) tablet 1 tablet, 1 tablet, Oral, BID, Cox, Amy N, DO, 1 tablet at 01/16/24 0905   traMADol  (ULTRAM ) tablet 50 mg, 50 mg, Oral, Q6H PRN, Cox, Amy N, DO, 50 mg at 01/16/24 1418  Social History: Social History   Tobacco Use   Smoking status: Never   Smokeless tobacco: Never  Vaping Use   Vaping status: Never Used  Substance Use Topics   Alcohol  use: No   Drug use: No    Family Medical History: Family History  Problem Relation Age of Onset   Colon cancer Neg Hx    Breast cancer Neg Hx     Physical Examination: Vitals:   01/16/24 1200 01/16/24 1349  BP: (!) 174/67 138/89  Pulse: 91 92  Resp: 16 18  Temp:  98.1 F (36.7 C)  SpO2: 100% 97%     General: Patient is well developed, well nourished, calm, collected, and in no apparent distress.  NEUROLOGICAL:  General: In no  acute distress.   Awake, alert, oriented to person, place, and time.    Strength: Patient able to move her bilateral lower extremities with no major deficit noted.  Diffusely hyporeflexic  She does have pain to palpation at the ASIS which causes some slight radiation.  No sensory loss in the anterior lateral thigh, anterior thigh, or knee.  No clear straight leg raise pain or reverse straight leg raise pain.  Imaging: MR LUMBAR SPINE WO CONTRAST Result Date: 01/16/2024 CLINICAL DATA:  Lumbar radiculopathy, no red flags, no prior management Severe left hip pain with limited range of motion. Recently diagnosed with compression fracture in upper back. No recent injury. EXAM: MRI LUMBAR SPINE WITHOUT CONTRAST TECHNIQUE: Multiplanar, multisequence MR imaging of the lumbar spine was performed. No intravenous contrast was administered. COMPARISON:  CT lumbar spine 01/15/2024. Abdominopelvic CT 04/26/2023. FINDINGS: Segmentation:  There are 5 lumbar type vertebral bodies. Alignment: Stable alignment with a mild scoliosis and minimal retrolisthesis at L1-2. Vertebrae: As seen on recent CT, there are mild superior endplate compression deformities at T12 and L1 with associated low level marrow edema. These are new from 04/26/2023 abdominal CT and likely subacute. There is also a mild superior endplate compression fracture at T11 which is new, but without associated marrow edema. Additionally, there are suspected mild compression fractures involving the inferior endplate of L3 and the superior endplate of L4 with associated bone marrow edema, but no significant loss of vertebral body height. No evidence of discitis, osteomyelitis or pars defect. Conus medullaris and cauda equina: Conus extends to the L1-2 level. Conus and cauda equina appear normal. Paraspinal and other soft tissues: No significant paraspinal findings. Incompletely visualized gallbladder appears distended with a dependent stone. Disc levels: L1-2:  Chronic loss of disc height with disc bulging, endplate osteophytes and a small central disc protrusion. No significant spinal stenosis or nerve root impingement. L2-3: Chronic loss of disc height with annular disc bulging and endplate osteophytes, asymmetric to the right. Mild facet hypertrophy. Mild asymmetric narrowing of the right lateral recess and right foramen without nerve root impingement. No spinal canal stenosis. L3-4: Loss of disc height with annular disc bulging, endplate osteophytes and vacuum phenomenon. Mild bilateral facet hypertrophy. Resulting mild spinal stenosis with mild asymmetric right lateral recess and left foraminal narrowing. L4-5: Chronic loss of disc height with annular disc bulging and endplate osteophytes asymmetric to the left. Moderate facet and ligamentous hypertrophy. Resulting moderate multifactorial spinal stenosis with asymmetric narrowing of the left lateral recess and left foramen. L5-S1: Chronic loss of disc height with annular disc bulging and endplate osteophytes. Mild bilateral facet hypertrophy. No significant spinal stenosis. Mild to moderate chronic foraminal narrowing bilaterally. IMPRESSION: 1. Mild superior endplate compression fractures at T12 and L1  are new from 04/26/2023 abdominal CT and with mild associated marrow edema, likely subacute. 2. Mild compression fractures involving the inferior endplate of L3 and superior endplate of L4 with associated bone marrow edema, but no significant loss of vertebral body height. These are likely acute. 3. Mild healed superior endplate compression deformity at T11. 4. Multilevel spondylosis with resulting mild spinal stenosis at L3-4 and moderate multifactorial spinal stenosis at L4-5. There is asymmetric narrowing of the left lateral recess and left foramen at L4-5 which could contribute to left L4 and/or L5 radiculopathy. 5. Mild to moderate chronic foraminal narrowing bilaterally at L5-S1. 6. Cholelithiasis. Electronically  Signed   By: Elsie Perone M.D.   On: 01/16/2024 13:16   CT Hip Left Wo Contrast Result Date: 01/15/2024 EXAM: CT OF THE LEFT HIP WITHOUT IV CONTRAST 01/15/2024 01: 42:46 PM TECHNIQUE: CT of the left hip was performed without the administration of intravenous contrast. Multiplanar reformatted images are provided for review. Automated exposure control, iterative reconstruction, and/or weight based adjustment of the mA/kV was utilized to reduce the radiation dose to as low as reasonably achievable. COMPARISON: None available. CLINICAL HISTORY: Hip pain, stress fracture suspected. FINDINGS: BONES: No acute fracture or dislocation. No aggressive appearing osseous abnormality or periostitis. Moderate craniocaudad and axial loss of articular cartilage thickness in the left hip with associated spurring of the left femoral head and acetabulum compatible with moderate degenerative hip arthropathy. SOFT TISSUE: Systemic atherosclerosis is present, including the aorta and iliac arteries. Sigmoid colon diverticulosis. Accentuated fluid density in the vaginal vault, significance uncertain. Correlate with any related symptoms. JOINT: Upper normal amount of fluid in the left hip joint. INTRAPELVIC CONTENTS: Limited images of the intrapelvic contents demonstrate sigmoid colon diverticulosis and accentuated fluid density in the vaginal vault, significance uncertain. Correlate with any related symptoms. IMPRESSION: 1. No acute fracture or bony injury. MRI can provide greater sensitivity for stress injuries and bone bruising. 2. Moderate left hip degenerative arthropathy with cartilage thinning and osteophytes. 3. Upper normal volume left hip joint fluid. 4. Systemic atherosclerosis involving the aorta and iliac arteries. 5. Sigmoid colon diverticulosis. 6. Accentuated fluid density in the vaginal vault; significance uncertain. Electronically signed by: Ryan Salvage MD 01/15/2024 02:03 PM EDT RP Workstation: [id number]    CT Lumbar Spine Wo Contrast Result Date: 01/15/2024 EXAM: CT OF THE LUMBAR SPINE WITHOUT CONTRAST 01/15/2024 01:42:46 PM TECHNIQUE: CT of the lumbar spine was performed without the administration of intravenous contrast. Multiplanar reformatted images are provided for review. Automated exposure control, iterative reconstruction, and/or weight based adjustment of the mA/kV was utilized to reduce the radiation dose to as low as reasonably achievable. COMPARISON: None available. CLINICAL HISTORY: Low back pain. FINDINGS: BONES AND ALIGNMENT: Interval 35 percent anterior compression fracture at T12. Interval 25 percent supraband plate compression fracture at L1. No significant bony retropulsion. Compression fracture likely subacute. Diffuse bony demineralization. Levoconvex lumbar scoliosis with rotary component. DEGENERATIVE CHANGES: Prominent loss of intervertebral disc space with vacuum disc phenomenon at all lumbar levels compatible with degenerative disc disease. Moderate left foraminal stenosis at L3-4, L4-5, and L5-S1 due to disc osteophyte complex and facet arthropathy. Moderate right foraminal stenosis at L5-S1 due to disc osteophyte complex and facet arthropathy. Moderate central stenosis at L4-5 due to disc bulge and facet arthropathy. SOFT TISSUES: Moderate sized hiatal hernia. Systemic atherosclerosis is present, including the aorta and iliac arteries. Sigmoid colon diverticulosis. IMPRESSION: 1. Interval 35% anterior compression fracture at T12 and 25% superior endplate compression fracture at L1, likely  subacute, without significant bony retropulsion. 2. Moderate left foraminal stenosis at L3-4, L4-5, and L5-S1; moderate right foraminal stenosis at L5-S1; and moderate central stenosis at L4-5. 3. Prominent multilevel degenerative disc disease with vacuum disc phenomenon at all lumbar levels. 4. Levoconvex lumbar scoliosis with rotary component. Electronically signed by: Ryan Salvage MD  01/15/2024 01:58 PM EDT RP Workstation: [id number]   DG Hip Unilat With Pelvis 2-3 Views Left Result Date: 01/15/2024 CLINICAL DATA:  Left hip pain. EXAM: DG HIP (WITH OR WITHOUT PELVIS) 2-3V LEFT COMPARISON:  None Available. FINDINGS: Bones are diffusely demineralized. SI joints and symphysis pubis unremarkable. No evidence for an acute pubic ramus fracture. AP and cross-table lateral views of the left hip show no evidence for dislocation. No femoral neck fracture evident. IMPRESSION: 1. No acute bony findings. 2. Diffuse bony demineralization. Electronically Signed   By: Camellia Candle M.D.   On: 01/15/2024 12:57     I have personally reviewed the images and agree with the above interpretation.  Labs:    Latest Ref Rng & Units 01/16/2024    2:44 AM 01/15/2024    4:22 PM 01/15/2024    1:17 PM  CBC  WBC 4.0 - 10.5 K/uL 10.6  10.2  9.1   Hemoglobin 12.0 - 15.0 g/dL 9.3  88.9  89.6   Hematocrit 36.0 - 46.0 % 30.8  34.2  32.4   Platelets 150 - 400 K/uL 225  283  236       Latest Ref Rng & Units 01/16/2024    2:44 AM 01/15/2024    1:17 PM 08/07/2023    3:19 PM  BMP  Glucose 70 - 99 mg/dL 84  875  890   BUN 8 - 23 mg/dL 15  17  17    Creatinine 0.44 - 1.00 mg/dL 9.02  8.93  8.91   BUN/Creat Ratio 6 - 22 (calc)   16   Sodium 135 - 145 mmol/L 143  138  142   Potassium 3.5 - 5.1 mmol/L 4.1  4.5  4.4   Chloride 98 - 111 mmol/L 105  101  103   CO2 22 - 32 mmol/L 23  25  31    Calcium  8.9 - 10.3 mg/dL 8.8  9.4  89.4         Assessment and Plan: Ms. Daubert is a pleasant 89 y.o. female with relatively acute onset of left lower extremity pain.  She states that this starts at the lateral hip and goes down to her upper leg above her knee.  She does not have any radiating symptoms past her knee.  She does not have any associated numbness weakness or tingling.  She just states that when the pain does hit it causes some shooting pains down her leg in the after mentioned distribution.  On  physical examination she does not have any deficits.  She does have some pain to palpation at the ASIS with some local radiation.  This could be consistent with meralgia paresthetica given her anterior lateral leg pain however she did not have any clear specific inciting events.  Could consider neuropathic pain medication, however patient states that her symptoms are well-controlled to the point where she does not feel she needs any further escalation of care.  I evaluated her lumbar MRI, her symptoms would be consistent with a L1 or L2 type radiculopathy however these nerve roots on the left appear to be widely patent without active compression.  She does have some new lumbar compression fractures  which could be irritating nearby neural structures are causing some referred pain.  She is not interested in utilizing a TLSO brace.  She is also not interested in any neurosurgical/spinal intervention as she was offered this previously in the past but did not feel that she would want to move forward with spine surgery for any reason.  We discussed red flag signs and symptoms that she could reach out to us  with at this point she is not requesting a follow-up with neurosurgery.  Penne MICAEL Sharps, MD/MSCR Dept. of Neurosurgery

## 2024-01-16 NOTE — Progress Notes (Addendum)
 OT Cancellation Note  Patient Details Name: Wanda Cohen MRN: [id number] DOB: 1935-02-12   Cancelled Treatment:    Reason Eval/Treat Not Completed: Other (comment). Consult received, chart reviewed. Pt pending IR to r/o septic joint/aspiration. Will follow acutely and re-attempt pending additional work up and POC.   Addendum 12:50pm: 2nd attempt pt currently getting imaging for further work up. Will continue to follow acutely and intervene as medically appropriate.   Genora Arp R., MPH, MS, OTR/L ascom [phone] 01/16/24, 8:07 AM

## 2024-01-16 NOTE — Progress Notes (Signed)
 TSLO brace ordered for patient, Per note  from Dr. Claudene she is not interested in utilizing TSLO brace. Brace is still ordered and she is going to reconsider it tomorrow after being evaluated by PT.  Purwick placed, and BSC bedside to limit ambulation without brace.

## 2024-01-16 NOTE — Progress Notes (Addendum)
 Progress Note   Patient: Wanda Cohen FMW:[id number] DOB: 1934/09/19 DOA: 01/15/2024     1 DOS: the patient was seen and examined on 01/16/2024   Brief hospital course: [name] is a 89 year old female with history of hypertension, hypothyroid, rheumatoid arthritis, GERD, who presents ED for chief concerns of right hip pain.  Vitals in the ED showed t 98.1, rr 17, hr 102, blood pressure 178/88, SpO2 of 98% on room air.  Serum sodium 138, potassium 4.5, chloride 101, bicarb 25, BUN of 17, serum creatinine 1.06, eGFR 51, WBC 9.1, hemoglobin 10.3, platelets of 236.  Multiple pelvic, lumbar spine and hip imaging, was negative for any acute fracture or bony injury, there was moderate left hip joint degenerative arthropathy, also shows a but normal volume left hip joint fluid, CRP normal with mildly elevated ESR.  IR was consulted to see if they can get some joint aspirate to rule out any septic arthritis although less likely.  ED treatment: Fentanyl  50 mcg IV one-time dose.  10/16: Hemodynamically stable, interventional radiology and orthopedic surgery evaluated her and they does not think that pain is coming from right hip, no concern of infection.  MRI of lumbar spine was obtained for concern of lumbar radiculopathy and it shows multiple compression fractures, acute and subacute involving lower thoracic and upper lumbar spine.  Neurosurgery was also consulted.  Assessment and Plan: * Inadequate pain control Pain out of proportion to imaging and given no prior history of trauma unclear etiology at this time.  Initially thought infection but it was ruled out, CRP normal and barely positive ESR with history of rheumatoid arthritis. IR and orthopedic surgery suggested MRI of lumbar spine for concern of lumbar radiculopathy and it did came back positive for multiple acute and subacute compression fractures.  Patient is high risk being on steroid.  -Neurosurgery was  consulted -Continue with pain management and supportive care  Lumbar radiculopathy, acute MRI lumbar spine concerning for multiple acute and subacute compression fractures involving lower thoracic and lumbar vertebra's.  Clinically positive for lumbar radiculopathy. - Neurosurgery was consulted -Continue with supportive care - Also ordered TLSO brace and lidocaine  patch  Immunocompromised patient Patient on chronic steroids secondary to rheumatoid arthritis and on methotrexate   At risk for falling PT and OT evaluation  Benign essential hypertension Lisinopril  5 mg daily resumed Hydralazine 5 mg IV every 6 hours, prn for SBP > 165  Hypothyroidism Home levothyroxine  75 mcg daily resume  Rheumatoid arthritis (HCC) Monoclonal antibody infusion to be resumed outpatient, methotrexate  not resumed on admission Prednisone  10 mg daily with breakfast resume  Gastroesophageal reflux disease without esophagitis Home PPI equivalent resume   Subjective: Patient was lying down comfortably and recently got morphine  for pain so pain seems better.  Still having significant pain with movement.  Patient was initially quite reluctant to try TLSO brace but eventually agreed.  Physical Exam: Vitals:   01/16/24 1056 01/16/24 1145 01/16/24 1200 01/16/24 1349  BP:  (!) 175/75 (!) 174/67 138/89  Pulse:  91 91 92  Resp:  18 16 18   Temp: 98.5 F (36.9 C)   98.1 F (36.7 C)  TempSrc: Oral   Oral  SpO2:  98% 100% 97%   General.  Frail elderly lady, in no acute distress. Pulmonary.  Lungs clear bilaterally, normal respiratory effort. CV.  Regular rate and rhythm, no JVD, rub or murmur. Abdomen.  Soft, nontender, nondistended, BS positive. CNS.  Alert and oriented .  No focal neurologic deficit. Extremities.  No edema, no cyanosis, pulses intact and symmetrical. Psychiatry.  Judgment and insight appears normal.   Data Reviewed: Prior data reviewed  Family Communication: Discussed with daughter  at bedside.  Disposition: Status is: Inpatient Remains inpatient appropriate because: Severity of illness  Planned Discharge Destination: To be determined  DVT prophylaxis.  Subcu heparin Time spent: 50 minutes  This record has been created using Conservation officer, historic buildings. Errors have been sought and corrected,but may not always be located. Such creation errors do not reflect on the standard of care.   Author: Amaryllis Dare, MD 01/16/2024 4:06 PM  For on call review [email or web address].

## 2024-01-16 NOTE — ED Notes (Signed)
 Pt rang callbell out to nurses station. Pt reported that her back and leg were hurting her. Pt and tech adjusted pt in bed. Pillows placed underneath pts right and left buttocks. Pillow also placed underneath pts feet. Pt to be given PRN pain medication.

## 2024-01-16 NOTE — Assessment & Plan Note (Addendum)
 MRI lumbar spine concerning for multiple acute and subacute compression fractures involving lower thoracic and lumbar vertebra's.  Clinically positive for lumbar radiculopathy. - Neurosurgery was consulted -Continue with supportive care - Also ordered TLSO brace and lidocaine  patch

## 2024-01-16 NOTE — Progress Notes (Signed)
 PT Cancellation Note  Patient Details Name: Wanda Cohen MRN: [id number] DOB: October 27, 1934   Cancelled Treatment:    Reason Eval/Treat Not Completed: Medical issues which prohibited therapy Spoke with nurse who reports that pt is having ongoing medical issues including some neuro concerns.  Pt to have lumbar MRI later today, suggests holding PT until further work-up is completed.  Will maintain on caseload and hope to be able to initiate PT eval tomorrow as appropriate.  Wanda Cohen, DPT 01/16/2024, 1:01 PM

## 2024-01-16 NOTE — Consult Note (Signed)
 ORTHOPAEDIC CONSULTATION  REQUESTING PHYSICIAN: Caleen Qualia, MD  Chief Complaint:   Left hip pain  History of Present Illness: Wanda Cohen is a 89 y.o. female with history of hypertension, hypothyroid, rheumatoid arthritis, GERD, who presents to the emergency room yesterday with single day onset of left lateral hip pain.  She reports no known trauma or injury but reports new excruciating pain at the lateral aspect of her left leg which radiates down her lateral thigh to her knee.  It slowly came on and then worsened throughout the the morning leading to her coming to the emergency room the afternoon.  She denies any systemic symptoms no fevers chills other injuries or ever having any significant similar symptoms.  She does report increasing back pain over the last 2 months and over lower back with no specific trauma numbness over her low back and new radiation of pain over the lateral hip.  She she also reports increasing urinary incontinence over the last few months.  Past Medical History:  Diagnosis Date   Colon cancer, ascending (HCC)    hx of   Hx of breast cancer    Hypertension    Hypothyroidism    Past Surgical History:  Procedure Laterality Date   BREAST EXCISIONAL BIOPSY Right    COLONOSCOPY N/A 12/11/2012   Procedure: COLONOSCOPY;  Surgeon: Claudis RAYMOND Rivet, MD;  Location: [facility];  Service: Endoscopy;  Laterality: N/A;  1030   COLONOSCOPY N/A 07/09/2017   Procedure: COLONOSCOPY;  Surgeon: Mavis Anes, MD;  Location: [facility];  Service: Gastroenterology;  Laterality: N/A;   MASTECTOMY Left    RIGHT COLECTOMY     Social History   Socioeconomic History   Marital status: Widowed    Spouse name: Not on file   Number of children: Not on file   Years of education: Not on file   Highest education level: Not on file  Occupational History   Not on file  Tobacco Use   Smoking status: Never    Smokeless tobacco: Never  Vaping Use   Vaping status: Never Used  Substance and Sexual Activity   Alcohol  use: No   Drug use: No   Sexual activity: Not Currently  Other Topics Concern   Not on file  Social History Narrative   Not on file   Social Drivers of Health   Financial Resource Strain: Low Risk  (01/09/2024)   Overall Financial Resource Strain (CARDIA)    Difficulty of Paying Living Expenses: Not hard at all  Food Insecurity: No Food Insecurity (01/09/2024)   Hunger Vital Sign    Worried About Running Out of Food in the Last Year: Never true    Ran Out of Food in the Last Year: Never true  Transportation Needs: No Transportation Needs (01/09/2024)   PRAPARE - Administrator, Civil Service (Medical): No    Lack of Transportation (Non-Medical): No  Physical Activity: Sufficiently Active (01/09/2024)   Exercise Vital Sign    Days of Exercise per Week: 7 days    Minutes of Exercise per Session: 30 min  Stress: No Stress Concern Present (01/09/2024)   [employer] of Occupational Health - Occupational Stress Questionnaire    Feeling of Stress: Not at all  Social Connections: Socially Isolated (01/09/2024)   Social Connection and Isolation Panel    Frequency of Communication with Friends and Family: More than three times a week    Frequency of Social Gatherings with Friends and Family: More than three  times a week    Attends Religious Services: Never    Active Member of Clubs or Organizations: No    Attends Banker Meetings: Never    Marital Status: Widowed   Family History  Problem Relation Age of Onset   Colon cancer Neg Hx    Breast cancer Neg Hx    No Known Allergies Prior to Admission medications   Medication Sig Start Date End Date Taking? Authorizing Provider  CRANBERRY PO Take by mouth daily.   Yes [provider]  cyanocobalamin  (VITAMIN B12) 1000 MCG tablet Take 1,000 mcg by mouth daily.   Yes [provider]   D-MANNOSE PO Take by mouth daily.   Yes [provider]  fluticasone  (FLONASE ) 50 MCG/ACT nasal spray Place 2 sprays into both nostrils daily. 01/01/22  Yes Gareth Mliss FALCON, FNP  folic acid  (FOLVITE ) 1 MG tablet Take 1 tablet (1 mg total) by mouth daily. 08/07/23  Yes Gareth Mliss FALCON, FNP  golimumab  (SIMPONI  ARIA) 50 MG/4ML SOLN injection Every 8 weeks   Yes [provider]  levothyroxine  (SYNTHROID ) 75 MCG tablet Take 1 tablet (75 mcg total) by mouth daily. 08/07/23  Yes Gareth Mliss FALCON, FNP  lisinopril  (ZESTRIL ) 5 MG tablet Take 1 tablet daily 08/07/23  Yes Pender, Julie F, FNP  methotrexate  2.5 MG tablet Take by mouth See admin instructions. Take 6 tablets weekly on the same day. Sunday   Yes [provider]  omeprazole  (PRILOSEC) 40 MG capsule Take 1 capsule (40 mg total) by mouth daily. 08/07/23  Yes Pender, Julie F, FNP  predniSONE  (DELTASONE ) 10 MG tablet Take 1 tablet (10 mg total) by mouth daily with breakfast. 10/31/23  Yes Harden Jerona GAILS, MD  Probiotic Product (PROBIOTIC DAILY PO) Take by mouth daily.   Yes [provider]  traMADol  (ULTRAM ) 50 MG tablet Take 1 tablet (50 mg total) by mouth daily as needed for severe pain (pain score 7-10). 11/15/23  Yes Gareth Mliss FALCON, FNP  Vitamin D , Ergocalciferol , (DRISDOL ) 1.25 MG (50000 UNIT) CAPS capsule Take 1 capsule (50,000 Units total) by mouth once a week. Patient taking differently: Take 50,000 Units by mouth once a week. On Wednesday 08/07/23  Yes Gareth Mliss FALCON, FNP  estradiol  (ESTRACE ) 0.1 MG/GM vaginal cream Estrogen Cream Instruction Discard applicator Apply pea sized amount to tip of finger to urethra before bed. Wash hands well after application. Use Monday, Wednesday and Friday 02/27/23   Penne Knee, MD  gabapentin  (NEURONTIN ) 100 MG capsule Take 1 capsule (100 mg total) by mouth at bedtime. Patient not taking: Reported on 01/09/2024 11/14/23   Gareth Mliss FALCON, FNP  Multiple Vitamins-Minerals (PRESERVISION  AREDS PO) Take by mouth. Patient not taking: Reported on 01/15/2024    [provider]   CT Hip Left Wo Contrast Result Date: 01/15/2024 EXAM: CT OF THE LEFT HIP WITHOUT IV CONTRAST 01/15/2024 01: 42:46 PM TECHNIQUE: CT of the left hip was performed without the administration of intravenous contrast. Multiplanar reformatted images are provided for review. Automated exposure control, iterative reconstruction, and/or weight based adjustment of the mA/kV was utilized to reduce the radiation dose to as low as reasonably achievable. COMPARISON: None available. CLINICAL HISTORY: Hip pain, stress fracture suspected. FINDINGS: BONES: No acute fracture or dislocation. No aggressive appearing osseous abnormality or periostitis. Moderate craniocaudad and axial loss of articular cartilage thickness in the left hip with associated spurring of the left femoral head and acetabulum compatible with moderate degenerative hip arthropathy. SOFT TISSUE:  Systemic atherosclerosis is present, including the aorta and iliac arteries. Sigmoid colon diverticulosis. Accentuated fluid density in the vaginal vault, significance uncertain. Correlate with any related symptoms. JOINT: Upper normal amount of fluid in the left hip joint. INTRAPELVIC CONTENTS: Limited images of the intrapelvic contents demonstrate sigmoid colon diverticulosis and accentuated fluid density in the vaginal vault, significance uncertain. Correlate with any related symptoms. IMPRESSION: 1. No acute fracture or bony injury. MRI can provide greater sensitivity for stress injuries and bone bruising. 2. Moderate left hip degenerative arthropathy with cartilage thinning and osteophytes. 3. Upper normal volume left hip joint fluid. 4. Systemic atherosclerosis involving the aorta and iliac arteries. 5. Sigmoid colon diverticulosis. 6. Accentuated fluid density in the vaginal vault; significance uncertain. Electronically signed by: Ryan Salvage MD 01/15/2024  02:03 PM EDT RP Workstation: [id number]   CT Lumbar Spine Wo Contrast Result Date: 01/15/2024 EXAM: CT OF THE LUMBAR SPINE WITHOUT CONTRAST 01/15/2024 01:42:46 PM TECHNIQUE: CT of the lumbar spine was performed without the administration of intravenous contrast. Multiplanar reformatted images are provided for review. Automated exposure control, iterative reconstruction, and/or weight based adjustment of the mA/kV was utilized to reduce the radiation dose to as low as reasonably achievable. COMPARISON: None available. CLINICAL HISTORY: Low back pain. FINDINGS: BONES AND ALIGNMENT: Interval 35 percent anterior compression fracture at T12. Interval 25 percent supraband plate compression fracture at L1. No significant bony retropulsion. Compression fracture likely subacute. Diffuse bony demineralization. Levoconvex lumbar scoliosis with rotary component. DEGENERATIVE CHANGES: Prominent loss of intervertebral disc space with vacuum disc phenomenon at all lumbar levels compatible with degenerative disc disease. Moderate left foraminal stenosis at L3-4, L4-5, and L5-S1 due to disc osteophyte complex and facet arthropathy. Moderate right foraminal stenosis at L5-S1 due to disc osteophyte complex and facet arthropathy. Moderate central stenosis at L4-5 due to disc bulge and facet arthropathy. SOFT TISSUES: Moderate sized hiatal hernia. Systemic atherosclerosis is present, including the aorta and iliac arteries. Sigmoid colon diverticulosis. IMPRESSION: 1. Interval 35% anterior compression fracture at T12 and 25% superior endplate compression fracture at L1, likely subacute, without significant bony retropulsion. 2. Moderate left foraminal stenosis at L3-4, L4-5, and L5-S1; moderate right foraminal stenosis at L5-S1; and moderate central stenosis at L4-5. 3. Prominent multilevel degenerative disc disease with vacuum disc phenomenon at all lumbar levels. 4. Levoconvex lumbar scoliosis with rotary component. Electronically  signed by: Ryan Salvage MD 01/15/2024 01:58 PM EDT RP Workstation: [id number]   DG Hip Unilat With Pelvis 2-3 Views Left Result Date: 01/15/2024 CLINICAL DATA:  Left hip pain. EXAM: DG HIP (WITH OR WITHOUT PELVIS) 2-3V LEFT COMPARISON:  None Available. FINDINGS: Bones are diffusely demineralized. SI joints and symphysis pubis unremarkable. No evidence for an acute pubic ramus fracture. AP and cross-table lateral views of the left hip show no evidence for dislocation. No femoral neck fracture evident. IMPRESSION: 1. No acute bony findings. 2. Diffuse bony demineralization. Electronically Signed   By: Camellia Candle M.D.   On: 01/15/2024 12:57    Positive ROS: All other systems have been reviewed and were otherwise negative with the exception of those mentioned in the HPI and as above.  Physical Exam: General:  Alert, no acute distress Psychiatric:  Patient is competent for consent with normal mood and affect   Cardiovascular:  No pedal edema Respiratory:  No wheezing, non-labored breathing GI:  Abdomen is soft and non-tender Skin:  No lesions in the area of chief complaint Neurologic:  Sensation intact distally Lymphatic:  No axillary or cervical lymphadenopathy  Orthopedic Exam:  Left lower extremity Skin intact over the hip Mild tenderness over the lateral thigh She localizes her pain to the lateral thigh radiating down to the knee which is reproduced with straight leg raise On distracted exam I aggressively internally and externally rotated and logroll the hip while extended and the patient did not experience any discomfort in her hip at this time No pain with micromotion Pain localized to the lateral hip with max flexion and external rotation some groin pain with max internal rotation Sensation grossly intact No focal neurodeficits in the left leg Compartments all soft  Secondary survey No tenderness to palpation over other bony prominences in the lower extremities or bilateral  upper extremities No pain with logroll or simulated axial loading of the right lower extremity All compartments soft Palpation over the thoracolumbar spine mostly paraspinal on the left with some decrease sensation noted over her right flank Motor grossly intact throughout, no focal deficits Sensation grossly intact throughout, no focal deficits sepsis noted above Good distal pulses and capillary refill on all extremities   X-rays:  CT scan reviewed of the left hip and the lumbar spine.  There are moderate to severe degenerative changes of the left hip with bone-on-bone articulation and osteophyte formation.  There is a very small reactive effusion consistent with an arthritic process.  Lumbar spine shows significant multilevel degenerative disease with disc height loss osteophyte formation and chronic deformity.  Agree with radiologist interpretation full reads above.  Assessment: Left hip arthritis, possible left lumbar radiculopathy  Plan: I reviewed the clinical and radiographic findings with the patient.  Her physical exam is not concerning at all for infection in her left hip at this time.  She has no pain with micromotion logroll and distracted exam.  This also puts me at low clinical suspicion for any sort of occult or stress fracture.  I do not see a role for an MRI or an IR aspiration of the hip at this time given her physical exam and laboratory findings.  She is having some radiating pain down the left leg which was positive on exam as well as a recent history of possible increasing urinary incontinence.  Reviewed the CT lumbar spine and feel that the patient would benefit from a further workup in her spine with a possible MRI or neurosurgery consultation.  I gave her follow-up information to see me in the office for routine follow-up of the hip and consideration for injections.  All questions answered she agrees with above plan.  No plan for orthopedic surgical intervention patient to  have a diet for my perspective.    Arthea Sheer MD  Beeper #:  [phone]  01/16/2024 10:06 AM

## 2024-01-17 DIAGNOSIS — Z9181 History of falling: Secondary | ICD-10-CM | POA: Diagnosis not present

## 2024-01-17 DIAGNOSIS — S32000A Wedge compression fracture of unspecified lumbar vertebra, initial encounter for closed fracture: Secondary | ICD-10-CM | POA: Diagnosis not present

## 2024-01-17 DIAGNOSIS — M5416 Radiculopathy, lumbar region: Secondary | ICD-10-CM | POA: Diagnosis not present

## 2024-01-17 DIAGNOSIS — D849 Immunodeficiency, unspecified: Secondary | ICD-10-CM | POA: Diagnosis not present

## 2024-01-17 NOTE — Plan of Care (Signed)

## 2024-01-17 NOTE — Evaluation (Signed)
 Physical Therapy Evaluation Patient Details Name: Wanda Cohen MRN: [id number] DOB: 1935/03/03 Today's Date: 01/17/2024  History of Present Illness  89 year old female with history of hypertension, hypothyroid, rheumatoid arthritis, GERD, who presents ED for chief concerns of intractable right hip pain. MRI of lumbar spine was obtained for concern of lumbar radiculopathy and it shows multiple compression fractures, acute and subacute involving lower thoracic and upper lumbar spine.  Clinical Impression  Patient admitted with the above. PTA, patient lives at [facility] and was modI for ambulation with rollator and able to perform ADLs and iADLs independently. Daughter assists with driving to/from appointments. Patient required minA for bed mobility with assist for LLE. MinA+2 to stand from low bed surface with RW and verbal cues for hand placement. Able to take steps over to recliner with RW and min-CGA+2, however patient sliding R LE as she is fearful of bearing full weight on LLE. TLSO not present during session and patient unsure if she wants to try it. Patient will benefit from skilled PT services during acute stay to address listed deficits. Patient will benefit from ongoing therapy at discharge to maximize functional independence and safety.         If plan is discharge home, recommend the following: A little help with walking and/or transfers;A little help with bathing/dressing/bathroom;Assistance with cooking/housework;Assist for transportation   Can travel by private vehicle   Yes    Equipment Recommendations Rolling Alhassan Everingham (2 wheels);BSC/3in1  Recommendations for Other Services       Functional Status Assessment Patient has had a recent decline in their functional status and demonstrates the ability to make significant improvements in function in a reasonable and predictable amount of time.     Precautions / Restrictions Precautions Precautions:  Fall;Back Precaution/Restrictions Comments: utilizing back precautions to support pain med given compression fractures; TLSO not present on evaluation Required Braces or Orthoses: Spinal Brace Spinal Brace: Thoracolumbosacral orthotic (not present) Restrictions Weight Bearing Restrictions Per Provider Order: No      Mobility  Bed Mobility Overal bed mobility: Needs Assistance Bed Mobility: Supine to Sit     Supine to sit: Min assist, HOB elevated, Used rails     General bed mobility comments: increased time/effort    Transfers Overall transfer level: Needs assistance Equipment used: Rolling Tyana Butzer (2 wheels) Transfers: Sit to/from Stand, Bed to chair/wheelchair/BSC Sit to Stand: +2 physical assistance, Min assist   Step pivot transfers: Min assist, Contact guard assist, +2 physical assistance       General transfer comment: VC for hand placement    Ambulation/Gait               General Gait Details: deferred further mobility due to dizziness  Stairs            Wheelchair Mobility     Tilt Bed    Modified Rankin (Stroke Patients Only)       Balance Overall balance assessment: Needs assistance Sitting-balance support: Feet supported, Single extremity supported, Bilateral upper extremity supported Sitting balance-Leahy Scale: Fair     Standing balance support: Single extremity supported, During functional activity Standing balance-Leahy Scale: Poor Standing balance comment: required UE vs BUE support on the RW for stability during LB dressing                             Pertinent Vitals/Pain Pain Assessment Pain Assessment: 0-10 Pain Score: 5  Pain Location: L hip Pain Descriptors / Indicators:  Aching Pain Intervention(s): Limited activity within patient's tolerance, Monitored during session    Home Living Family/patient expects to be discharged to:: Private residence (indep living [place]) Living Arrangements: Alone    Type of Home: Apartment         Home Layout: One level Home Equipment: Tourist information centre manager (4 wheels);Shower seat      Prior Function Prior Level of Function : Independent/Modified Independent             Mobility Comments: rollator for mobility, denies additional falls ADLs Comments: indep ADL, [employer] provided meals, indep with laundry, cleaning, med mgt, daughter takes to dr appts     Extremity/Trunk Assessment   Upper Extremity Assessment Upper Extremity Assessment: Generalized weakness    Lower Extremity Assessment Lower Extremity Assessment: Generalized weakness    Cervical / Trunk Assessment Cervical / Trunk Assessment: Kyphotic  Communication   Communication Communication: No apparent difficulties    Cognition Arousal: Alert Behavior During Therapy: WFL for tasks assessed/performed   PT - Cognitive impairments: No apparent impairments                         Following commands: Intact       Cueing Cueing Techniques: Verbal cues     General Comments      Exercises     Assessment/Plan    PT Assessment Patient needs continued PT services  PT Problem List Decreased strength;Decreased activity tolerance;Decreased balance;Decreased mobility;Decreased safety awareness;Decreased knowledge of use of DME;Decreased knowledge of precautions;Pain       PT Treatment Interventions DME instruction;Gait training;Functional mobility training;Therapeutic activities;Balance training;Therapeutic exercise;Neuromuscular re-education;Patient/family education    PT Goals (Current goals can be found in the Care Plan section)  Acute Rehab PT Goals Patient Stated Goal: to reduce pain PT Goal Formulation: With patient Time For Goal Achievement: 01/31/24 Potential to Achieve Goals: Good    Frequency Min 2X/week     Co-evaluation PT/OT/SLP Co-Evaluation/Treatment: Yes Reason for Co-Treatment: For patient/therapist safety;To address functional/ADL  transfers PT goals addressed during session: Mobility/safety with mobility;Balance;Proper use of DME OT goals addressed during session: ADL's and self-care;Proper use of Adaptive equipment and DME       AM-PAC PT 6 Clicks Mobility  Outcome Measure Help needed turning from your back to your side while in a flat bed without using bedrails?: A Little Help needed moving from lying on your back to sitting on the side of a flat bed without using bedrails?: A Little Help needed moving to and from a bed to a chair (including a wheelchair)?: A Lot Help needed standing up from a chair using your arms (e.g., wheelchair or bedside chair)?: A Lot Help needed to walk in hospital room?: A Lot Help needed climbing 3-5 steps with a railing? : A Lot 6 Click Score: 14    End of Session   Activity Tolerance: Patient tolerated treatment well Patient left: in chair;with call bell/phone within reach;with chair alarm set Nurse Communication: Mobility status PT Visit Diagnosis: Unsteadiness on feet (R26.81);Muscle weakness (generalized) (M62.81);Other abnormalities of gait and mobility (R26.89)    Time: 9169-9146 PT Time Calculation (min) (ACUTE ONLY): 23 min   Charges:   PT Evaluation $PT Eval Moderate Complexity: 1 Mod PT Treatments $Therapeutic Activity: 8-22 mins PT General Charges $$ ACUTE PT VISIT: 1 Visit         Maryanne Finder, PT, DPT Physical Therapist - [facility]  [facility]   Estel Tonelli A Nykiah Ma 01/17/2024,  9:36 AM

## 2024-01-17 NOTE — TOC Initial Note (Signed)
 Transition of Care [facility]) - Initial/Assessment Note    Patient Details  Name: Wanda Cohen MRN: [id number] Date of Birth: June 01, 1934  Transition of Care [facility]) CM/SW Contact:    Corean ONEIDA Haddock, RN Phone Number: 01/17/2024, 3:17 PM  Clinical Narrative:                    Met with patient and son Oneil at bedside Patient resides at [facility] Therapy recommends SNF Patient in agreement.  Bed offers presented.  CMS Medicare.gov Compare Post Acute Care list reviewed with patient and son [facility] was selected.  Accepted in [place] and notified, Ana at [facility].  Per [facility] they are not accepted weekend admissions and can admit patient on Monday      Patient Goals and CMS Choice            Expected Discharge Plan and Services                                              Prior Living Arrangements/Services                       Activities of Daily Living      Permission Sought/Granted                  Emotional Assessment              Admission diagnosis:  Left hip pain [M25.552] Inadequate pain control [R52] Compression fracture of T12 vertebra, initial encounter (HCC) [S22.080A] Compression fracture of L1 vertebra, initial encounter (HCC) [S32.010A] Patient Active Problem List   Diagnosis Date Noted   Lumbar radiculopathy, acute 01/16/2024   Spinal compression fracture (HCC) 01/16/2024   Inadequate pain control 01/15/2024   At risk for falling 01/15/2024   Immunocompromised patient 01/15/2024   Age-related osteoporosis without current pathological fracture 08/07/2023   Hiatal hernia 08/07/2023   Gastroesophageal reflux disease without esophagitis 08/07/2023   Chronic frontal sinusitis 08/07/2023   Eustachian tube dysfunction, bilateral 08/07/2023   Personal history of breast cancer 01/01/2022   Vitamin D  deficiency 06/17/2021   Hypothyroidism    Rheumatoid arthritis (HCC) 06/15/2021   Personal history of  colon cancer    Diverticulosis of large intestine without diverticulitis    Body mass index 25.0-25.9, adult 07/18/2016   Chronic kidney disease, stage II (mild) 05/01/2016   Benign essential hypertension 04/21/2013   Pure hypercholesterolemia 10/07/2012   PCP:  Gareth Mliss FALCON, FNP Pharmacy:   [facility] [phone] [address], [place] - [address] [place] [address] Phone: [phone] Fax: [phone]     Social Drivers of Health (SDOH) Social History: SDOH Screenings   Food Insecurity: No Food Insecurity (01/16/2024)  Housing: Low Risk  (01/16/2024)  Transportation Needs: No Transportation Needs (01/17/2024)  Utilities: Not At Risk (01/16/2024)  Alcohol  Screen: Low Risk  (01/31/2023)  Depression (PHQ2-9): Low Risk  (01/09/2024)  Financial Resource Strain: Low Risk  (01/09/2024)  Physical Activity: Sufficiently Active (01/09/2024)  Social Connections: Unknown (01/17/2024)  Recent Concern: Social Connections - Socially Isolated (01/09/2024)  Stress: No Stress Concern Present (01/09/2024)  Tobacco Use: Low Risk  (01/15/2024)  Health Literacy: Adequate Health Literacy (01/09/2024)   SDOH Interventions:     Readmission Risk Interventions     No data to display

## 2024-01-17 NOTE — NC FL2 (Signed)
 [place]  MEDICAID FL2 LEVEL OF CARE FORM     IDENTIFICATION  Patient Name: Wanda Cohen Birthdate: November 10, 1934 Sex: female Admission Date (Current Location): 01/15/2024  [facility] and [place] Number:  Chiropodist and Address:         Provider Number: [id number]  Attending Physician Name and Address:  Caleen Qualia, MD  Relative Name and Phone Number:       Current Level of Care: Hospital Recommended Level of Care: Skilled Nursing Facility Prior Approval Number:    Date Approved/Denied:   PASRR Number: [id number]  Discharge Plan: SNF    Current Diagnoses: Patient Active Problem List   Diagnosis Date Noted   Lumbar radiculopathy, acute 01/16/2024   Spinal compression fracture (HCC) 01/16/2024   Inadequate pain control 01/15/2024   At risk for falling 01/15/2024   Immunocompromised patient 01/15/2024   Age-related osteoporosis without current pathological fracture 08/07/2023   Hiatal hernia 08/07/2023   Gastroesophageal reflux disease without esophagitis 08/07/2023   Chronic frontal sinusitis 08/07/2023   Eustachian tube dysfunction, bilateral 08/07/2023   Personal history of breast cancer 01/01/2022   Vitamin D  deficiency 06/17/2021   Hypothyroidism    Rheumatoid arthritis (HCC) 06/15/2021   Personal history of colon cancer    Diverticulosis of large intestine without diverticulitis    Body mass index 25.0-25.9, adult 07/18/2016   Chronic kidney disease, stage II (mild) 05/01/2016   Benign essential hypertension 04/21/2013   Pure hypercholesterolemia 10/07/2012    Orientation RESPIRATION BLADDER Height & Weight     Self, Time, Situation, Place  Normal Continent Weight:   Height:     BEHAVIORAL SYMPTOMS/MOOD NEUROLOGICAL BOWEL NUTRITION STATUS      Continent Diet (regular)  AMBULATORY STATUS COMMUNICATION OF NEEDS Skin   Extensive Assist Verbally Other (Comment) (Reddness Buttocks)                       Personal Care Assistance Level  of Assistance              Functional Limitations Info             SPECIAL CARE FACTORS FREQUENCY  PT (By licensed PT), OT (By licensed OT)                    Contractures Contractures Info: Not present    Additional Factors Info  Code Status, Allergies Code Status Info: DNR Allergies Info: NKDA           Current Medications (01/17/2024):  This is the current hospital active medication list Current Facility-Administered Medications  Medication Dose Route Frequency Provider Last Rate Last Admin   acetaminophen  (TYLENOL ) tablet 650 mg  650 mg Oral Q6H PRN Cox, Amy N, DO       Or   acetaminophen  (TYLENOL ) suppository 650 mg  650 mg Rectal Q6H PRN Cox, Amy N, DO       bisacodyl (DULCOLAX) EC tablet 5 mg  5 mg Oral Daily PRN Cox, Amy N, DO       cyanocobalamin  (VITAMIN B12) tablet 1,000 mcg  1,000 mcg Oral Daily Cox, Amy N, DO   1,000 mcg at 01/17/24 0817   folic acid  (FOLVITE ) tablet 1 mg  1 mg Oral Daily Cox, Amy N, DO   1 mg at 01/17/24 0817   heparin injection 5,000 Units  5,000 Units Subcutaneous Q8H Cox, Amy N, DO   5,000 Units at 01/17/24 0552   hydrALAZINE (APRESOLINE) injection 5 mg  5  mg Intravenous Q6H PRN Cox, Amy N, DO       levothyroxine  (SYNTHROID ) tablet 75 mcg  75 mcg Oral Q0600 Cox, Amy N, DO   75 mcg at 01/17/24 9385   lidocaine  (LIDODERM ) 5 % 2 patch  2 patch Transdermal Q24H Amin, Sumayya, MD   2 patch at 01/16/24 2206   lisinopril  (ZESTRIL ) tablet 5 mg  5 mg Oral Daily Cox, Amy N, DO   5 mg at 01/17/24 9182   ondansetron  (ZOFRAN ) tablet 4 mg  4 mg Oral Q6H PRN Cox, Amy N, DO       Or   ondansetron  (ZOFRAN ) injection 4 mg  4 mg Intravenous Q6H PRN Cox, Amy N, DO       pantoprazole (PROTONIX) EC tablet 80 mg  80 mg Oral Daily Cox, Amy N, DO   80 mg at 01/17/24 9182   predniSONE  (DELTASONE ) tablet 10 mg  10 mg Oral Q breakfast Cox, Amy N, DO   10 mg at 01/17/24 9182   senna-docusate (Senokot-S) tablet 1 tablet  1 tablet Oral BID Cox, Amy N, DO   1  tablet at 01/17/24 9182   traMADol  (ULTRAM ) tablet 50 mg  50 mg Oral Q6H PRN Cox, Amy N, DO   50 mg at 01/16/24 2212     Discharge Medications: Please see discharge summary for a list of discharge medications.  Relevant Imaging Results:  Relevant Lab Results:   Additional Information SS# [id number]  Corean ONEIDA Haddock, RN

## 2024-01-17 NOTE — Assessment & Plan Note (Signed)
 PT and OT evaluation-recommending SNF

## 2024-01-17 NOTE — Evaluation (Signed)
 Occupational Therapy Evaluation Patient Details Name: Wanda Cohen MRN: [id number] DOB: 08-Feb-1935 Today's Date: 01/17/2024   History of Present Illness   89 year old female with history of hypertension, hypothyroid, rheumatoid arthritis, GERD, who presents ED for chief concerns of intractable right hip pain. MRI of lumbar spine was obtained for concern of lumbar radiculopathy and it shows multiple compression fractures, acute and subacute involving lower thoracic and upper lumbar spine.     Clinical Impressions Pt was seen for OT evaluation and co-tx with PT this date. Prior to hospital admission, pt was independent with ADL and most IADL (dtr drives and [place] apartment provides meals), using a rollator for mobility, and living by herself in an independent living apartment at [facility]. Pt presents with deficits in strength, balance, back and L hip pain, and activity tolerance, affecting safe and optimal ADL completion. Pt currently requires MIN A +1 for bed mobility, MIN A +2 for STS and step pivot transfers with RW, MAX A for socks mgt, and MOD A for donning brief. Pt endorses 5-6/10 pain during session. TLSO not present and pt still unsure whether she wants to try it. Care team notified. Pt would benefit from skilled OT services to address noted impairments and functional limitations (see below for any additional details) in order to maximize safety and independence while minimizing future risk of falls, injury, and readmission. Anticipate the need for follow up OT services upon acute hospital DC.    If plan is discharge home, recommend the following:   A little help with walking and/or transfers;A lot of help with bathing/dressing/bathroom;Assistance with cooking/housework;Assist for transportation;Help with stairs or ramp for entrance     Functional Status Assessment   Patient has had a recent decline in their functional status and demonstrates the ability to make significant  improvements in function in a reasonable and predictable amount of time.     Equipment Recommendations   BSC/3in1;Other (comment) (2WW)     Recommendations for Other Services         Precautions/Restrictions   Precautions Precautions: Fall;Back Precaution/Restrictions Comments: utilizing back precautions to support pain med given compression fractures; TLSO not present on evaluation Required Braces or Orthoses: Spinal Brace Spinal Brace: Thoracolumbosacral orthotic (not present) Restrictions Weight Bearing Restrictions Per Provider Order: Yes RLE Weight Bearing Per Provider Order: Weight bearing as tolerated LLE Weight Bearing Per Provider Order: Weight bearing as tolerated     Mobility Bed Mobility Overal bed mobility: Needs Assistance Bed Mobility: Supine to Sit     Supine to sit: Min assist, HOB elevated, Used rails     General bed mobility comments: increased time/effort    Transfers Overall transfer level: Needs assistance Equipment used: Rolling walker (2 wheels) Transfers: Sit to/from Stand, Bed to chair/wheelchair/BSC Sit to Stand: +2 physical assistance, Min assist     Step pivot transfers: Min assist, Contact guard assist, +2 physical assistance     General transfer comment: VC for hand placement      Balance Overall balance assessment: Needs assistance Sitting-balance support: Feet supported, Single extremity supported, Bilateral upper extremity supported Sitting balance-Leahy Scale: Fair     Standing balance support: Single extremity supported, During functional activity Standing balance-Leahy Scale: Poor Standing balance comment: required UE vs BUE support on the RW for stability during LB dressing                           ADL either performed or assessed with clinical judgement  ADL Overall ADL's : Needs assistance/impaired                     Lower Body Dressing: Sitting/lateral leans;Maximal assistance;Moderate  assistance Lower Body Dressing Details (indicate cue type and reason): MAX A for socks mgt, MOD A for donning brief, using alternating UE for completing donning over hips in standing Toilet Transfer: Minimal assistance;Contact guard assist;+2 for physical assistance;BSC/3in1;Rolling walker (2 wheels) Toilet Transfer Details (indicate cue type and reason): simulated to recliner, step pivot CGA-MIN A +2         Functional mobility during ADLs: Minimal assistance;+2 for physical assistance;Cueing for sequencing;Rolling walker (2 wheels)       Vision         Perception         Praxis         Pertinent Vitals/Pain Pain Assessment Pain Assessment: 0-10 Pain Score: 5  Pain Location: L hip Pain Descriptors / Indicators: Aching Pain Intervention(s): Monitored during session, Repositioned     Extremity/Trunk Assessment Upper Extremity Assessment Upper Extremity Assessment: Generalized weakness   Lower Extremity Assessment Lower Extremity Assessment: Generalized weakness   Cervical / Trunk Assessment Cervical / Trunk Assessment: Kyphotic   Communication Communication Communication: No apparent difficulties   Cognition Arousal: Alert Behavior During Therapy: WFL for tasks assessed/performed Cognition: No apparent impairments                               Following commands: Intact       Cueing  General Comments   Cueing Techniques: Verbal cues      Exercises Other Exercises Other Exercises: Pt edu in AE for LB ADL tasks to minimize bending and support back comfort   Shoulder Instructions      Home Living Family/patient expects to be discharged to:: Private residence (indep living [place]) Living Arrangements: Alone   Type of Home: Apartment       Home Layout: One level     Bathroom Shower/Tub: Producer, television/film/video: Handicapped height Bathroom Accessibility: Yes   Home Equipment: Tourist information centre manager (4 wheels);Shower  seat          Prior Functioning/Environment Prior Level of Function : Independent/Modified Independent             Mobility Comments: rollator for mobility, denies additional falls ADLs Comments: indep ADL, [employer] provided meals, indep with laundry, cleaning, med mgt, daughter takes to dr appts    OT Problem List: Decreased strength;Pain;Decreased activity tolerance;Impaired balance (sitting and/or standing);Decreased knowledge of use of DME or AE;Decreased knowledge of precautions   OT Treatment/Interventions: Self-care/ADL training;Therapeutic exercise;Therapeutic activities;DME and/or AE instruction;Patient/family education;Balance training;Energy conservation      OT Goals(Current goals can be found in the care plan section)   Acute Rehab OT Goals Patient Stated Goal: get better OT Goal Formulation: With patient Time For Goal Achievement: 01/31/24 Potential to Achieve Goals: Good ADL Goals Pt Will Perform Lower Body Dressing: with adaptive equipment;sit to/from stand;with modified independence Pt Will Transfer to Toilet: with modified independence;ambulating (LRAD,) Pt Will Perform Toileting - Clothing Manipulation and hygiene: with modified independence;sitting/lateral leans Additional ADL Goal #1: Pt will utilize learned adaptive strategies to support back precautions during ADL/mobility wihtout VC to initiate, 5/5 opportunities.   OT Frequency:  Min 2X/week    Co-evaluation PT/OT/SLP Co-Evaluation/Treatment: Yes Reason for Co-Treatment: For patient/therapist safety;To address functional/ADL transfers PT goals addressed during session: Mobility/safety with  mobility;Balance;Proper use of DME OT goals addressed during session: ADL's and self-care;Proper use of Adaptive equipment and DME      AM-PAC OT 6 Clicks Daily Activity     Outcome Measure Help from another person eating meals?: None Help from another person taking care of personal grooming?: A  Little Help from another person toileting, which includes using toliet, bedpan, or urinal?: A Little Help from another person bathing (including washing, rinsing, drying)?: A Lot Help from another person to put on and taking off regular upper body clothing?: A Little Help from another person to put on and taking off regular lower body clothing?: A Lot 6 Click Score: 17   End of Session Equipment Utilized During Treatment: Rolling walker (2 wheels) Nurse Communication: Mobility status  Activity Tolerance: Patient tolerated treatment well Patient left: in chair;with call bell/phone within reach;with chair alarm set  OT Visit Diagnosis: Other abnormalities of gait and mobility (R26.89);History of falling (Z91.81);Muscle weakness (generalized) (M62.81);Pain Pain - Right/Left: Left Pain - part of body: Hip (and back)                Time: 9170-9147 OT Time Calculation (min): 23 min Charges:  OT General Charges $OT Visit: 1 Visit OT Evaluation $OT Eval Moderate Complexity: 1 Mod  Warren SAUNDERS., MPH, MS, OTR/L ascom [phone] 01/17/24, 9:24 AM

## 2024-01-17 NOTE — Progress Notes (Signed)
 Progress Note   Patient: Wanda Cohen FMW:[id number] DOB: 10/17/1934 DOA: 01/15/2024     2 DOS: the patient was seen and examined on 01/17/2024   Brief hospital course: Mr. Electa Sterry is a 89 year old female with history of hypertension, hypothyroid, rheumatoid arthritis, GERD, who presents ED for chief concerns of right hip pain.  Vitals in the ED showed t 98.1, rr 17, hr 102, blood pressure 178/88, SpO2 of 98% on room air.  Serum sodium 138, potassium 4.5, chloride 101, bicarb 25, BUN of 17, serum creatinine 1.06, eGFR 51, WBC 9.1, hemoglobin 10.3, platelets of 236.  Multiple pelvic, lumbar spine and hip imaging, was negative for any acute fracture or bony injury, there was moderate left hip joint degenerative arthropathy, also shows a but normal volume left hip joint fluid, CRP normal with mildly elevated ESR.  IR was consulted to see if they can get some joint aspirate to rule out any septic arthritis although less likely.  ED treatment: Fentanyl  50 mcg IV one-time dose.  10/16: Hemodynamically stable, interventional radiology and orthopedic surgery evaluated her and they does not think that pain is coming from right hip, no concern of infection.  MRI of lumbar spine was obtained for concern of lumbar radiculopathy and it shows multiple compression fractures, acute and subacute involving lower thoracic and upper lumbar spine.  Neurosurgery was also consulted.  10/17: Remained hemodynamically stable, pain bearable with current regimen.  PT is recommending SNF-should be able to go to [facility] on Monday.  Assessment and Plan: * Inadequate pain control Pain out of proportion to imaging and given no prior history of trauma unclear etiology at this time.  Initially thought infection but it was ruled out, CRP normal and barely positive ESR with history of rheumatoid arthritis. IR and orthopedic surgery suggested MRI of lumbar spine for concern of lumbar radiculopathy and it did  came back positive for multiple acute and subacute compression fractures.  Patient is high risk being on steroid.  -Neurosurgery was consulted -Continue with pain management and supportive care  Lumbar radiculopathy, acute MRI lumbar spine concerning for multiple acute and subacute compression fractures involving lower thoracic and lumbar vertebra's.  Clinically positive for lumbar radiculopathy. - Neurosurgery was consulted -Continue with supportive care - Also ordered TLSO brace and lidocaine  patch  Immunocompromised patient Patient on chronic steroids secondary to rheumatoid arthritis and on methotrexate   At risk for falling PT and OT evaluation-recommending SNF  Benign essential hypertension Lisinopril  5 mg daily resumed Hydralazine 5 mg IV every 6 hours, prn for SBP > 165  Hypothyroidism Home levothyroxine  75 mcg daily resume  Rheumatoid arthritis (HCC) Monoclonal antibody infusion to be resumed outpatient, methotrexate  not resumed on admission Prednisone  10 mg daily with breakfast resume  Gastroesophageal reflux disease without esophagitis Home PPI equivalent resume   Subjective: Patient was sitting in chair when seen today.  Pain seems bearable today.  Son at bedside.  Physical Exam: Vitals:   01/16/24 2028 01/17/24 0536 01/17/24 0813 01/17/24 1015  BP: (!) 123/58 (!) 151/61 (!) 161/66 127/69  Pulse: (!) 101 79 90   Resp: 16 16    Temp: 99.2 F (37.3 C) 97.7 F (36.5 C) 98.2 F (36.8 C)   TempSrc: Oral Oral Oral   SpO2: 94% 98% 97%    General.  Frail elderly lady, in no acute distress. Pulmonary.  Lungs clear bilaterally, normal respiratory effort. CV.  Regular rate and rhythm, no JVD, rub or murmur. Abdomen.  Soft, nontender, nondistended, BS positive.  CNS.  Alert and oriented .  No focal neurologic deficit. Extremities.  No edema, no cyanosis, pulses intact and symmetrical. Psychiatry.  Judgment and insight appears normal.    Data Reviewed: Prior data  reviewed  Family Communication: Discussed with son at bedside.  Disposition: Status is: Inpatient Remains inpatient appropriate because: Severity of illness  Planned Discharge Destination: SNF  DVT prophylaxis.  Subcu heparin Time spent: 45 minutes  This record has been created using Conservation officer, historic buildings. Errors have been sought and corrected,but may not always be located. Such creation errors do not reflect on the standard of care.   Author: Amaryllis Dare, MD 01/17/2024 3:42 PM  For on call review [email or web address].

## 2024-01-17 NOTE — Progress Notes (Signed)
 Mobility Specialist - Progress Note   01/17/24 1601  Mobility  Activity Ambulated with assistance  Level of Assistance Minimal assist, patient does 75% or more  Assistive Device Front wheel walker  Distance Ambulated (ft) 8 ft  Activity Response Tolerated well  Mobility visit 1 Mobility  Mobility Specialist Start Time (ACUTE ONLY) 1532  Mobility Specialist Stop Time (ACUTE ONLY) 1538  Mobility Specialist Time Calculation (min) (ACUTE ONLY) 6 min   Pt seated on the BSC upon entry, utilizing RA. Pt STS to RW, amb to the recliner MinA-CGA +1, extra time required for steps toward the recliner d/t being seated for extended amount of time on the [facility]. Pt left seated in the recliner with alarm set and needs within reach.  America Silvan Mobility Specialist 01/17/24 4:06 PM

## 2024-01-17 NOTE — Care Management Important Message (Signed)
 Important Message  Patient Details  Name: Wanda Cohen MRN: [id number] Date of Birth: Jun 28, 1934   Important Message Given:  Yes - Medicare IM     Rojelio SHAUNNA Rattler 01/17/2024, 3:26 PM

## 2024-01-17 NOTE — Assessment & Plan Note (Signed)
 Pain out of proportion to imaging and given no prior history of trauma unclear etiology at this time.  Initially thought infection but it was ruled out, CRP normal and barely positive ESR with history of rheumatoid arthritis. IR and orthopedic surgery suggested MRI of lumbar spine for concern of lumbar radiculopathy and it did came back positive for multiple acute and subacute compression fractures.  Patient is high risk being on steroid.  -Neurosurgery was consulted -Continue with pain management and supportive care

## 2024-01-18 DIAGNOSIS — M5416 Radiculopathy, lumbar region: Secondary | ICD-10-CM | POA: Diagnosis not present

## 2024-01-18 DIAGNOSIS — Z9181 History of falling: Secondary | ICD-10-CM | POA: Diagnosis not present

## 2024-01-18 DIAGNOSIS — D849 Immunodeficiency, unspecified: Secondary | ICD-10-CM | POA: Diagnosis not present

## 2024-01-18 DIAGNOSIS — S32000A Wedge compression fracture of unspecified lumbar vertebra, initial encounter for closed fracture: Secondary | ICD-10-CM | POA: Diagnosis not present

## 2024-01-18 NOTE — Plan of Care (Signed)

## 2024-01-18 NOTE — Progress Notes (Signed)
 Progress Note   Patient: Wanda Cohen FMW:[id number] DOB: 01/05/1935 DOA: 01/15/2024     3 DOS: the patient was seen and examined on 01/18/2024   Brief hospital course: Mr. Zoella Roberti is a 89 year old female with history of hypertension, hypothyroid, rheumatoid arthritis, GERD, who presents ED for chief concerns of right hip pain.  Vitals in the ED showed t 98.1, rr 17, hr 102, blood pressure 178/88, SpO2 of 98% on room air.  Serum sodium 138, potassium 4.5, chloride 101, bicarb 25, BUN of 17, serum creatinine 1.06, eGFR 51, WBC 9.1, hemoglobin 10.3, platelets of 236.  Multiple pelvic, lumbar spine and hip imaging, was negative for any acute fracture or bony injury, there was moderate left hip joint degenerative arthropathy, also shows a but normal volume left hip joint fluid, CRP normal with mildly elevated ESR.  IR was consulted to see if they can get some joint aspirate to rule out any septic arthritis although less likely.  ED treatment: Fentanyl  50 mcg IV one-time dose.  10/16: Hemodynamically stable, interventional radiology and orthopedic surgery evaluated her and they does not think that pain is coming from right hip, no concern of infection.  MRI of lumbar spine was obtained for concern of lumbar radiculopathy and it shows multiple compression fractures, acute and subacute involving lower thoracic and upper lumbar spine.  Neurosurgery was also consulted.  10/17: Remained hemodynamically stable, pain bearable with current regimen.  PT is recommending SNF-should be able to go to [facility] on Monday.  10/18: Remains stable, awaiting to go to SNF on Monday.  Assessment and Plan: * Inadequate pain control Pain out of proportion to imaging and given no prior history of trauma unclear etiology at this time.  Initially thought infection but it was ruled out, CRP normal and barely positive ESR with history of rheumatoid arthritis. IR and orthopedic surgery suggested MRI of  lumbar spine for concern of lumbar radiculopathy and it did came back positive for multiple acute and subacute compression fractures.  Patient is high risk being on steroid.  -Neurosurgery was consulted -Continue with pain management and supportive care  Lumbar radiculopathy, acute MRI lumbar spine concerning for multiple acute and subacute compression fractures involving lower thoracic and lumbar vertebra's.  Clinically positive for lumbar radiculopathy. - Neurosurgery was consulted -Continue with supportive care - Also ordered TLSO brace and lidocaine  patch  Immunocompromised patient Patient on chronic steroids secondary to rheumatoid arthritis and on methotrexate   At risk for falling PT and OT evaluation-recommending SNF  Benign essential hypertension Lisinopril  5 mg daily resumed Hydralazine 5 mg IV every 6 hours, prn for SBP > 165  Hypothyroidism Home levothyroxine  75 mcg daily resume  Rheumatoid arthritis (HCC) Monoclonal antibody infusion to be resumed outpatient, methotrexate  not resumed on admission Prednisone  10 mg daily with breakfast resume  Gastroesophageal reflux disease without esophagitis Home PPI equivalent resume   Subjective: Patient was sitting in chair when seen today.  Having pain with ambulation only.  Physical Exam: Vitals:   01/17/24 0813 01/17/24 1015 01/17/24 1940 01/18/24 0831  BP: (!) 161/66 127/69 (!) 118/56 129/75  Pulse: 90  (!) 103 83  Resp:   16 18  Temp: 98.2 F (36.8 C)  98.6 F (37 C) 97.8 F (36.6 C)  TempSrc: Oral   Oral  SpO2: 97%  98% 98%   General.  Frail elderly lady, in no acute distress. Pulmonary.  Lungs clear bilaterally, normal respiratory effort. CV.  Regular rate and rhythm, no JVD, rub or murmur.  Abdomen.  Soft, nontender, nondistended, BS positive. CNS.  Alert and oriented .  No focal neurologic deficit. Extremities.  No edema, no cyanosis, pulses intact and symmetrical. Psychiatry.  Judgment and insight appears  normal.     Data Reviewed: Prior data reviewed  Family Communication: Called daughter with no response  Disposition: Status is: Inpatient Remains inpatient appropriate because: Severity of illness  Planned Discharge Destination: SNF  DVT prophylaxis.  Subcu heparin Time spent: 44 minutes  This record has been created using Conservation officer, historic buildings. Errors have been sought and corrected,but may not always be located. Such creation errors do not reflect on the standard of care.   Author: Amaryllis Dare, MD 01/18/2024 1:51 PM  For on call review [email or web address].

## 2024-01-19 DIAGNOSIS — S32000A Wedge compression fracture of unspecified lumbar vertebra, initial encounter for closed fracture: Secondary | ICD-10-CM | POA: Diagnosis not present

## 2024-01-19 DIAGNOSIS — D849 Immunodeficiency, unspecified: Secondary | ICD-10-CM | POA: Diagnosis not present

## 2024-01-19 DIAGNOSIS — M5416 Radiculopathy, lumbar region: Secondary | ICD-10-CM | POA: Diagnosis not present

## 2024-01-19 DIAGNOSIS — Z9181 History of falling: Secondary | ICD-10-CM | POA: Diagnosis not present

## 2024-01-19 MED ORDER — METHOTREXATE SODIUM 2.5 MG PO TABS
15.0000 mg | ORAL_TABLET | ORAL | Status: DC
  Administered 2024-01-19: 15 mg via ORAL
  Filled 2024-01-19: qty 6

## 2024-01-19 NOTE — Progress Notes (Signed)
 Progress Note   Patient: Wanda Cohen FMW:[id number] DOB: [birth year] DOA: 01/15/2024     4 DOS: the patient was seen and examined on 01/19/2024   Brief hospital course: [name] is a 89 year old female with history of hypertension, hypothyroid, rheumatoid arthritis, GERD, who presents ED for chief concerns of right hip pain.  Vitals in the ED showed t 98.1, rr 17, hr 102, blood pressure 178/88, SpO2 of 98% on room air.  Serum sodium 138, potassium 4.5, chloride 101, bicarb 25, BUN of 17, serum creatinine 1.06, eGFR 51, WBC 9.1, hemoglobin 10.3, platelets of 236.  Multiple pelvic, lumbar spine and hip imaging, was negative for any acute fracture or bony injury, there was moderate left hip joint degenerative arthropathy, also shows a but normal volume left hip joint fluid, CRP normal with mildly elevated ESR.  IR was consulted to see if they can get some joint aspirate to rule out any septic arthritis although less likely.  ED treatment: Fentanyl  50 mcg IV one-time dose.  10/16: Hemodynamically stable, interventional radiology and orthopedic surgery evaluated her and they does not think that pain is coming from right hip, no concern of infection.  MRI of lumbar spine was obtained for concern of lumbar radiculopathy and it shows multiple compression fractures, acute and subacute involving lower thoracic and upper lumbar spine.  Neurosurgery was also consulted.  10/17: Remained hemodynamically stable, pain bearable with current regimen.  PT is recommending SNF-should be able to go to [facility] on Monday.  10/18: Remains stable, awaiting to go to SNF on Monday.  Assessment and Plan: * Inadequate pain control Pain out of proportion to imaging and given no prior history of trauma unclear etiology at this time.  Initially thought infection but it was ruled out, CRP normal and barely positive ESR with history of rheumatoid arthritis. IR and orthopedic surgery suggested MRI of  lumbar spine for concern of lumbar radiculopathy and it did came back positive for multiple acute and subacute compression fractures.  Patient is high risk being on steroid.  -Neurosurgery was consulted -Continue with pain management and supportive care  Lumbar radiculopathy, acute MRI lumbar spine concerning for multiple acute and subacute compression fractures involving lower thoracic and lumbar vertebra's.  Clinically positive for lumbar radiculopathy. - Neurosurgery was consulted -Continue with supportive care - Also ordered TLSO brace and lidocaine  patch  Immunocompromised patient Patient on chronic steroids secondary to rheumatoid arthritis and on methotrexate   At risk for falling PT and OT evaluation-recommending SNF  Benign essential hypertension Lisinopril  5 mg daily resumed Hydralazine 5 mg IV every 6 hours, prn for SBP > 165  Hypothyroidism Home levothyroxine  75 mcg daily resume  Rheumatoid arthritis (HCC) Monoclonal antibody infusion to be resumed outpatient, methotrexate  not resumed on admission Prednisone  10 mg daily with breakfast resume  Gastroesophageal reflux disease without esophagitis Home PPI equivalent resume   Subjective: Patient was sitting comfortably in chair when seen today.  She had a transient episode of left-sided abdominal pain earlier in the morning which resolved spontaneously.  She had a bowel movement yesterday, no nausea or vomiting.  Physical Exam: Vitals:   01/18/24 1641 01/18/24 2054 01/19/24 0351 01/19/24 0936  BP: (!) 141/70 (!) 120/55 138/60 (!) 135/43  Pulse: 85 83 82 82  Resp: 16 16 18 16   Temp: 98.2 F (36.8 C) 98.4 F (36.9 C) 98.4 F (36.9 C) (!) 97.5 F (36.4 C)  TempSrc: Oral  Oral Oral  SpO2: 100% 97% 98% 100%   General.  Frail elderly lady, in no acute distress. Pulmonary.  Lungs clear bilaterally, normal respiratory effort. CV.  Regular rate and rhythm, no JVD, rub or murmur. Abdomen.  Soft, nontender,  nondistended, BS positive. CNS.  Alert and oriented .  No focal neurologic deficit. Extremities.  No edema, no cyanosis, pulses intact and symmetrical. Psychiatry.  Judgment and insight appears normal.      Data Reviewed: Prior data reviewed  Family Communication: Tried calling daughter with no response.  Disposition: Status is: Inpatient Remains inpatient appropriate because: Severity of illness  Planned Discharge Destination: SNF  DVT prophylaxis.  Subcu heparin Time spent: 43 minutes  This record has been created using Conservation officer, historic buildings. Errors have been sought and corrected,but may not always be located. Such creation errors do not reflect on the standard of care.   Author: Amaryllis Dare, MD 01/19/2024 3:27 PM  For on call review [email or web address].

## 2024-01-19 NOTE — Plan of Care (Signed)

## 2024-01-20 DIAGNOSIS — S32040D Wedge compression fracture of fourth lumbar vertebra, subsequent encounter for fracture with routine healing: Secondary | ICD-10-CM | POA: Diagnosis not present

## 2024-01-20 DIAGNOSIS — M4855XD Collapsed vertebra, not elsewhere classified, thoracolumbar region, subsequent encounter for fracture with routine healing: Secondary | ICD-10-CM | POA: Diagnosis not present

## 2024-01-20 DIAGNOSIS — G8929 Other chronic pain: Secondary | ICD-10-CM

## 2024-01-20 DIAGNOSIS — M25552 Pain in left hip: Secondary | ICD-10-CM | POA: Diagnosis not present

## 2024-01-20 DIAGNOSIS — S22080D Wedge compression fracture of T11-T12 vertebra, subsequent encounter for fracture with routine healing: Secondary | ICD-10-CM | POA: Diagnosis not present

## 2024-01-20 DIAGNOSIS — K8020 Calculus of gallbladder without cholecystitis without obstruction: Secondary | ICD-10-CM | POA: Diagnosis not present

## 2024-01-20 DIAGNOSIS — M069 Rheumatoid arthritis, unspecified: Secondary | ICD-10-CM | POA: Diagnosis not present

## 2024-01-20 DIAGNOSIS — E039 Hypothyroidism, unspecified: Secondary | ICD-10-CM | POA: Diagnosis not present

## 2024-01-20 DIAGNOSIS — J321 Chronic frontal sinusitis: Secondary | ICD-10-CM | POA: Diagnosis not present

## 2024-01-20 DIAGNOSIS — Z7952 Long term (current) use of systemic steroids: Secondary | ICD-10-CM | POA: Diagnosis not present

## 2024-01-20 DIAGNOSIS — I10 Essential (primary) hypertension: Secondary | ICD-10-CM | POA: Diagnosis not present

## 2024-01-20 DIAGNOSIS — S22080A Wedge compression fracture of T11-T12 vertebra, initial encounter for closed fracture: Secondary | ICD-10-CM | POA: Diagnosis not present

## 2024-01-20 DIAGNOSIS — D849 Immunodeficiency, unspecified: Secondary | ICD-10-CM | POA: Diagnosis not present

## 2024-01-20 DIAGNOSIS — M545 Low back pain, unspecified: Secondary | ICD-10-CM | POA: Diagnosis not present

## 2024-01-20 DIAGNOSIS — N1832 Chronic kidney disease, stage 3b: Secondary | ICD-10-CM | POA: Diagnosis not present

## 2024-01-20 DIAGNOSIS — S32030D Wedge compression fracture of third lumbar vertebra, subsequent encounter for fracture with routine healing: Secondary | ICD-10-CM | POA: Diagnosis not present

## 2024-01-20 DIAGNOSIS — M1612 Unilateral primary osteoarthritis, left hip: Secondary | ICD-10-CM | POA: Diagnosis not present

## 2024-01-20 DIAGNOSIS — K449 Diaphragmatic hernia without obstruction or gangrene: Secondary | ICD-10-CM | POA: Diagnosis not present

## 2024-01-20 DIAGNOSIS — S32010D Wedge compression fracture of first lumbar vertebra, subsequent encounter for fracture with routine healing: Secondary | ICD-10-CM | POA: Diagnosis not present

## 2024-01-20 DIAGNOSIS — I129 Hypertensive chronic kidney disease with stage 1 through stage 4 chronic kidney disease, or unspecified chronic kidney disease: Secondary | ICD-10-CM | POA: Diagnosis not present

## 2024-01-20 DIAGNOSIS — E7800 Pure hypercholesterolemia, unspecified: Secondary | ICD-10-CM | POA: Diagnosis not present

## 2024-01-20 DIAGNOSIS — M810 Age-related osteoporosis without current pathological fracture: Secondary | ICD-10-CM | POA: Diagnosis not present

## 2024-01-20 DIAGNOSIS — S32000A Wedge compression fracture of unspecified lumbar vertebra, initial encounter for closed fracture: Secondary | ICD-10-CM | POA: Diagnosis not present

## 2024-01-20 DIAGNOSIS — I700 Atherosclerosis of aorta: Secondary | ICD-10-CM | POA: Diagnosis not present

## 2024-01-20 DIAGNOSIS — M4854XD Collapsed vertebra, not elsewhere classified, thoracic region, subsequent encounter for fracture with routine healing: Secondary | ICD-10-CM | POA: Diagnosis not present

## 2024-01-20 DIAGNOSIS — K5900 Constipation, unspecified: Secondary | ICD-10-CM | POA: Diagnosis not present

## 2024-01-20 DIAGNOSIS — M4856XD Collapsed vertebra, not elsewhere classified, lumbar region, subsequent encounter for fracture with routine healing: Secondary | ICD-10-CM | POA: Diagnosis not present

## 2024-01-20 DIAGNOSIS — Z9181 History of falling: Secondary | ICD-10-CM | POA: Diagnosis not present

## 2024-01-20 DIAGNOSIS — N1831 Chronic kidney disease, stage 3a: Secondary | ICD-10-CM | POA: Diagnosis not present

## 2024-01-20 DIAGNOSIS — M5416 Radiculopathy, lumbar region: Secondary | ICD-10-CM | POA: Diagnosis not present

## 2024-01-20 DIAGNOSIS — E538 Deficiency of other specified B group vitamins: Secondary | ICD-10-CM | POA: Diagnosis not present

## 2024-01-20 DIAGNOSIS — I708 Atherosclerosis of other arteries: Secondary | ICD-10-CM | POA: Diagnosis not present

## 2024-01-20 DIAGNOSIS — R197 Diarrhea, unspecified: Secondary | ICD-10-CM | POA: Diagnosis not present

## 2024-01-20 DIAGNOSIS — Z853 Personal history of malignant neoplasm of breast: Secondary | ICD-10-CM | POA: Diagnosis not present

## 2024-01-20 DIAGNOSIS — M4856XA Collapsed vertebra, not elsewhere classified, lumbar region, initial encounter for fracture: Secondary | ICD-10-CM | POA: Diagnosis not present

## 2024-01-20 DIAGNOSIS — K219 Gastro-esophageal reflux disease without esophagitis: Secondary | ICD-10-CM | POA: Diagnosis not present

## 2024-01-20 DIAGNOSIS — S32010A Wedge compression fracture of first lumbar vertebra, initial encounter for closed fracture: Secondary | ICD-10-CM | POA: Diagnosis not present

## 2024-01-20 DIAGNOSIS — E559 Vitamin D deficiency, unspecified: Secondary | ICD-10-CM | POA: Diagnosis not present

## 2024-01-20 DIAGNOSIS — D649 Anemia, unspecified: Secondary | ICD-10-CM | POA: Diagnosis not present

## 2024-01-20 MED ORDER — BISACODYL 5 MG PO TBEC: 5.0000 mg | DELAYED_RELEASE_TABLET | Freq: Every day | ORAL | Status: AC | PRN

## 2024-01-20 MED ORDER — ACETAMINOPHEN 325 MG PO TABS: 650.0000 mg | ORAL_TABLET | Freq: Four times a day (QID) | ORAL | Status: AC | PRN

## 2024-01-20 MED ORDER — TRAMADOL HCL 50 MG PO TABS
50.0000 mg | ORAL_TABLET | Freq: Two times a day (BID) | ORAL | 0 refills | Status: AC | PRN
Start: 2024-01-20 — End: ?

## 2024-01-20 MED ORDER — ONDANSETRON HCL 4 MG PO TABS: 4.0000 mg | ORAL_TABLET | Freq: Four times a day (QID) | ORAL | Status: AC | PRN

## 2024-01-20 MED ORDER — LIDOCAINE 5 % EX PTCH: 2.0000 | MEDICATED_PATCH | CUTANEOUS | Status: AC

## 2024-01-20 NOTE — Plan of Care (Signed)
  Problem: Clinical Measurements: Goal: Will remain free from infection Outcome: Progressing   Problem: Activity: Goal: Risk for activity intolerance will decrease Outcome: Progressing   Problem: Elimination: Goal: Will not experience complications related to urinary retention Outcome: Progressing   Problem: Safety: Goal: Ability to remain free from injury will improve Outcome: Progressing   

## 2024-01-20 NOTE — Plan of Care (Signed)

## 2024-01-20 NOTE — TOC Transition Note (Signed)
 Transition of Care [facility]) - Discharge Note   Patient Details  Name: Wanda Cohen MRN: [id number] Date of Birth: 17-Oct-1934  [facility]) CM/SW Contact:  Corean ONEIDA Haddock, RN Phone Number: 01/20/2024, 10:48 AM   Clinical Narrative:      Patient will DC to: [facility] Anticipated DC date: 01/20/24  Family notified: son Pharmacologist by: son mark   Bedside RN to send signed scripts and DNR with patient   Per MD patient ready for DC to . RN, patient, patient's family, and facility notified of DC. Discharge Summary sent to facility. RN given number for report. DC packet on chart.  TOC signing off.        Patient Goals and CMS Choice            Discharge Placement                       Discharge Plan and Services Additional resources added to the After Visit Summary for                                       Social Drivers of Health (SDOH) Interventions SDOH Screenings   Food Insecurity: No Food Insecurity (01/16/2024)  Housing: Low Risk  (01/16/2024)  Transportation Needs: No Transportation Needs (01/17/2024)  Utilities: Not At Risk (01/16/2024)  Alcohol  Screen: Low Risk  (01/31/2023)  Depression (PHQ2-9): Low Risk  (01/09/2024)  Financial Resource Strain: Low Risk  (01/09/2024)  Physical Activity: Sufficiently Active (01/09/2024)  Social Connections: Unknown (01/17/2024)  Recent Concern: Social Connections - Socially Isolated (01/09/2024)  Stress: No Stress Concern Present (01/09/2024)  Tobacco Use: Low Risk  (01/15/2024)  Health Literacy: Adequate Health Literacy (01/09/2024)     Readmission Risk Interventions     No data to display

## 2024-01-20 NOTE — Progress Notes (Signed)
 Physical Therapy Treatment Patient Details Name: Wanda Cohen MRN: [id number] DOB: Feb 27, 1935 Today's Date: 01/20/2024   History of Present Illness 89 year old female with history of hypertension, hypothyroid, rheumatoid arthritis, GERD, who presents ED for chief concerns of intractable right hip pain. MRI of lumbar spine was obtained for concern of lumbar radiculopathy and it shows multiple compression fractures, acute and subacute involving lower thoracic and upper lumbar spine.    PT Comments  Upon arrival to room, pt standing with RW from [facility].  Pt looked up at me upon entering and had a LOB resulting in her falling back and sitting on  BSC.  Call bell was in reach and reviewed with pt need to call for assist.  I know, you distracted me  RN aware.  She is assisted as needed and has no increased C/o pain or injury.  She asks for shoes and is able to don with assist.  Attempted to help her to prevent excessive flexion (Still refuses TLSO) but she forward flexes and dons her shoes.  Education provided but pt stated it did not hurt her.  Again reviewed precautions.  She is able to increase gait 50' with slow gait with heavy BUE support where she stated arms to fatigue.  She opts to sit in recliner where she stated she has slept the past few nights as it is more comfortable for her.  Safety on at end of session and she is stated I won't get up alone again.  Needs in reach.     If plan is discharge home, recommend the following: A little help with walking and/or transfers;A little help with bathing/dressing/bathroom;Assistance with cooking/housework;Assist for transportation   Can travel by private vehicle        Equipment Recommendations  Rolling walker (2 wheels);BSC/3in1    Recommendations for Other Services       Precautions / Restrictions Precautions Precautions: Fall;Back Precaution/Restrictions Comments: utilizing back precautions to support pain med given compression fractures;  TLSO not present on evaluation Required Braces or Orthoses: Spinal Brace Spinal Brace: Thoracolumbosacral orthotic (not present) Restrictions Weight Bearing Restrictions Per Provider Order: No     Mobility  Bed Mobility               General bed mobility comments: in chair before and after Patient Response: Cooperative  Transfers Overall transfer level: Needs assistance Equipment used: Rolling walker (2 wheels) Transfers: Sit to/from Stand Sit to Stand: Contact guard assist, Min assist                Ambulation/Gait Ambulation/Gait assistance: Contact guard assist, Min assist Gait Distance (Feet): 50 Feet Assistive device: Rolling walker (2 wheels) Gait Pattern/deviations: Step-through pattern, Decreased step length - right, Decreased step length - left Gait velocity: dec     General Gait Details: heavy reliance on BUE's for support   Stairs             Wheelchair Mobility     Tilt Bed Tilt Bed Patient Response: Cooperative  Modified Rankin (Stroke Patients Only)       Balance Overall balance assessment: Needs assistance Sitting-balance support: Feet supported, Single extremity supported, Bilateral upper extremity supported Sitting balance-Leahy Scale: Good     Standing balance support: Bilateral upper extremity supported Standing balance-Leahy Scale: Poor Standing balance comment: +1 assist for safety                            Communication Communication Communication: No  apparent difficulties  Cognition Arousal: Alert Behavior During Therapy: WFL for tasks assessed/performed   PT - Cognitive impairments: No apparent impairments                         Following commands: Intact      Cueing Cueing Techniques: Verbal cues  Exercises      General Comments        Pertinent Vitals/Pain Pain Assessment Pain Assessment: Faces Faces Pain Scale: Hurts a little bit Pain Location: L hip Pain Descriptors /  Indicators: Aching Pain Intervention(s): Limited activity within patient's tolerance, Monitored during session, Repositioned    Home Living                          Prior Function            PT Goals (current goals can now be found in the care plan section) Progress towards PT goals: Progressing toward goals    Frequency    Min 2X/week      PT Plan      Co-evaluation              AM-PAC PT 6 Clicks Mobility   Outcome Measure  Help needed turning from your back to your side while in a flat bed without using bedrails?: A Little Help needed moving from lying on your back to sitting on the side of a flat bed without using bedrails?: A Little Help needed moving to and from a bed to a chair (including a wheelchair)?: A Little Help needed standing up from a chair using your arms (e.g., wheelchair or bedside chair)?: A Little Help needed to walk in hospital room?: A Little Help needed climbing 3-5 steps with a railing? : A Lot 6 Click Score: 17    End of Session Equipment Utilized During Treatment: Gait belt Activity Tolerance: Patient tolerated treatment well Patient left: in chair;with call bell/phone within reach;with chair alarm set Nurse Communication: Mobility status PT Visit Diagnosis: Unsteadiness on feet (R26.81);Muscle weakness (generalized) (M62.81);Other abnormalities of gait and mobility (R26.89)     Time: 1005-1018 PT Time Calculation (min) (ACUTE ONLY): 13 min  Charges:    $Gait Training: 8-22 mins PT General Charges $$ ACUTE PT VISIT: 1 Visit                   Lauraine Gills, PTA 01/20/24, 10:24 AM

## 2024-01-20 NOTE — Discharge Summary (Signed)
 Physician Discharge Summary   Patient: Wanda Cohen MRN: [id number] DOB: [birth year]  Admit date:     01/15/2024  Discharge date: 01/20/24  Discharge Physician: Amaryllis Dare   PCP: Gareth Mliss FALCON, FNP   Recommendations at discharge:  Please obtain CBC and BMP on follow-up Follow-up with neurosurgery Follow-up with primary care provider  Discharge Diagnoses: Principal Problem:   Inadequate pain control Active Problems:   Lumbar radiculopathy, acute   Spinal compression fracture (HCC)   Immunocompromised patient   At risk for falling   Benign essential hypertension   Hypothyroidism   Rheumatoid arthritis (HCC)   Gastroesophageal reflux disease without esophagitis   Pure hypercholesterolemia   Left hip pain   Chronic midline low back pain   Hospital Course: [name] is a 89 year old female with history of hypertension, hypothyroid, rheumatoid arthritis, GERD, who presents ED for chief concerns of right hip pain.  Vitals in the ED showed t 98.1, rr 17, hr 102, blood pressure 178/88, SpO2 of 98% on room air.  Serum sodium 138, potassium 4.5, chloride 101, bicarb 25, BUN of 17, serum creatinine 1.06, eGFR 51, WBC 9.1, hemoglobin 10.3, platelets of 236.  Multiple pelvic, lumbar spine and hip imaging, was negative for any acute fracture or bony injury, there was moderate left hip joint degenerative arthropathy, also shows a but normal volume left hip joint fluid, CRP normal with mildly elevated ESR.  IR was consulted to see if they can get some joint aspirate to rule out any septic arthritis although less likely.  ED treatment: Fentanyl  50 mcg IV one-time dose.  10/16: Hemodynamically stable, interventional radiology and orthopedic surgery evaluated her and they does not think that pain is coming from right hip, no concern of infection.  MRI of lumbar spine was obtained for concern of lumbar radiculopathy and it shows multiple compression fractures, acute and  subacute involving lower thoracic and upper lumbar spine.  Neurosurgery was also consulted.  10/17: Remained hemodynamically stable, pain bearable with current regimen.  PT is recommending SNF-should be able to go to [facility] on Monday.  10/18: Remains stable, awaiting to go to SNF on Monday.  10/20: Remained hemodynamically stable.  Only have pain with ambulation but stating that it is slowly improving.  Patient does not want to wear TLSO brace as it is uncomfortable for her hernia.  She will continue on current medications and need to have a close follow-up with her providers for further assistance.  Assessment and Plan: * Inadequate pain control Pain out of proportion to imaging and given no prior history of trauma unclear etiology at this time.  Initially thought infection but it was ruled out, CRP normal and barely positive ESR with history of rheumatoid arthritis. IR and orthopedic surgery suggested MRI of lumbar spine for concern of lumbar radiculopathy and it did came back positive for multiple acute and subacute compression fractures.  Patient is high risk being on steroid.  -Neurosurgery was consulted -Continue with pain management and supportive care  Lumbar radiculopathy, acute MRI lumbar spine concerning for multiple acute and subacute compression fractures involving lower thoracic and lumbar vertebra's.  Clinically positive for lumbar radiculopathy. - Neurosurgery was consulted-and recommended conservative management -Continue with supportive care - Also ordered TLSO brace and lidocaine  patch, patient does not want to wear TLSO brace  Immunocompromised patient Patient on chronic steroids secondary to rheumatoid arthritis and on methotrexate   At risk for falling PT and OT evaluation-recommending SNF  Benign essential hypertension Lisinopril  5 mg  daily resumed Hydralazine 5 mg IV every 6 hours, prn for SBP > 165  Hypothyroidism Home levothyroxine  75 mcg daily  resume  Rheumatoid arthritis (HCC) Monoclonal antibody infusion to be resumed outpatient, methotrexate  not resumed on admission Prednisone  10 mg daily with breakfast resume  Gastroesophageal reflux disease without esophagitis Home PPI equivalent resume   Pain control - [place]  Controlled Substance Reporting System database was reviewed. and patient was instructed, not to drive, operate heavy machinery, perform activities at heights, swimming or participation in water  activities or provide baby-sitting services while on Pain, Sleep and Anxiety Medications; until their outpatient Physician has advised to do so again. Also recommended to not to take more than prescribed Pain, Sleep and Anxiety Medications.  Consultants: Neurosurgery Procedures performed: None Disposition: Skilled nursing facility Diet recommendation:  Discharge Diet Orders (From admission, onward)     Start     Ordered   01/20/24 0000  Diet - low sodium heart healthy        01/20/24 1023           Regular diet DISCHARGE MEDICATION: Allergies as of 01/20/2024   No Known Allergies      Medication List     STOP taking these medications    gabapentin  100 MG capsule Commonly known as: NEURONTIN        TAKE these medications    acetaminophen  325 MG tablet Commonly known as: TYLENOL  Take 2 tablets (650 mg total) by mouth every 6 (six) hours as needed for mild pain (pain score 1-3), fever or headache (or Fever >/= 101).   bisacodyl 5 MG EC tablet Commonly known as: DULCOLAX Take 1 tablet (5 mg total) by mouth daily as needed for moderate constipation.   CRANBERRY PO Take by mouth daily.   cyanocobalamin  1000 MCG tablet Commonly known as: VITAMIN B12 Take 1,000 mcg by mouth daily.   D-MANNOSE PO Take by mouth daily.   estradiol  0.1 MG/GM vaginal cream Commonly known as: ESTRACE  Estrogen Cream Instruction Discard applicator Apply pea sized amount to tip of finger to urethra before bed. Wash  hands well after application. Use Monday, Wednesday and Friday   fluticasone  50 MCG/ACT nasal spray Commonly known as: FLONASE  Place 2 sprays into both nostrils daily.   folic acid  1 MG tablet Commonly known as: FOLVITE  Take 1 tablet (1 mg total) by mouth daily.   levothyroxine  75 MCG tablet Commonly known as: SYNTHROID  Take 1 tablet (75 mcg total) by mouth daily.   lidocaine  5 % Commonly known as: LIDODERM  Place 2 patches onto the skin daily. Remove & Discard patch within 12 hours or as directed by MD   lisinopril  5 MG tablet Commonly known as: ZESTRIL  Take 1 tablet daily   methotrexate  2.5 MG tablet Commonly known as: RHEUMATREX Take by mouth See admin instructions. Take 6 tablets weekly on the same day. Sunday   omeprazole  40 MG capsule Commonly known as: PRILOSEC Take 1 capsule (40 mg total) by mouth daily.   ondansetron  4 MG tablet Commonly known as: ZOFRAN  Take 1 tablet (4 mg total) by mouth every 6 (six) hours as needed for nausea.   predniSONE  10 MG tablet Commonly known as: DELTASONE  Take 1 tablet (10 mg total) by mouth daily with breakfast.   PRESERVISION AREDS PO Take by mouth.   PROBIOTIC DAILY PO Take by mouth daily.   Simponi  Aria 50 MG/4ML Soln injection Generic drug: golimumab  Every 8 weeks   traMADol  50 MG tablet Commonly known as: ULTRAM  Take 1  tablet (50 mg total) by mouth every 12 (twelve) hours as needed for severe pain (pain score 7-10) or moderate pain (pain score 4-6). What changed:  when to take this reasons to take this   Vitamin D  (Ergocalciferol ) 1.25 MG (50000 UNIT) Caps capsule Commonly known as: DRISDOL  Take 1 capsule (50,000 Units total) by mouth once a week. What changed: additional instructions               Discharge Care Instructions  (From admission, onward)           Start     Ordered   01/20/24 0000  No dressing needed        01/20/24 1023            Contact information for follow-up providers      Gareth Mliss FALCON, FNP. Schedule an appointment as soon as possible for a visit in 1 week(s).   Specialty: Nurse Practitioner Contact information: [address] [place] [address] [phone]         Claudene Penne ORN, MD. Schedule an appointment as soon as possible for a visit.   Specialty: Neurosurgery Contact information: [address] [place] [address] [phone]              Contact information for after-discharge care     Destination     [facility] .   Service: Skilled Nursing Contact information: [address] [place]  [address] [phone]                    Discharge Exam: There were no vitals filed for this visit. General.  Frail elderly lady, in no acute distress. Pulmonary.  Lungs clear bilaterally, normal respiratory effort. CV.  Regular rate and rhythm, no JVD, rub or murmur. Abdomen.  Soft, nontender, nondistended, BS positive. CNS.  Alert and oriented .  No focal neurologic deficit. Extremities.  No edema, no cyanosis, pulses intact and symmetrical. Psychiatry.  Judgment and insight appears normal.   Condition at discharge: stable  The results of significant diagnostics from this hospitalization (including imaging, microbiology, ancillary and laboratory) are listed below for reference.   Imaging Studies: MR LUMBAR SPINE WO CONTRAST Result Date: 01/16/2024 CLINICAL DATA:  Lumbar radiculopathy, no red flags, no prior management Severe left hip pain with limited range of motion. Recently diagnosed with compression fracture in upper back. No recent injury. EXAM: MRI LUMBAR SPINE WITHOUT CONTRAST TECHNIQUE: Multiplanar, multisequence MR imaging of the lumbar spine was performed. No intravenous contrast was administered. COMPARISON:  CT lumbar spine 01/15/2024. Abdominopelvic CT 04/26/2023. FINDINGS: Segmentation:  There  are 5 lumbar type vertebral bodies. Alignment: Stable alignment with a mild scoliosis and minimal retrolisthesis at L1-2. Vertebrae: As seen on recent CT, there are mild superior endplate compression deformities at T12 and L1 with associated low level marrow edema. These are new from 04/26/2023 abdominal CT and likely subacute. There is also a mild superior endplate compression fracture at T11 which is new, but without associated marrow edema. Additionally, there are suspected mild compression fractures involving the inferior endplate of L3 and the superior endplate of L4 with associated bone marrow edema, but no significant loss of vertebral body height. No evidence of discitis, osteomyelitis or pars defect. Conus medullaris and cauda equina: Conus extends to the L1-2 level. Conus and cauda equina appear normal. Paraspinal and other soft tissues: No significant paraspinal findings. Incompletely visualized gallbladder  appears distended with a dependent stone. Disc levels: L1-2: Chronic loss of disc height with disc bulging, endplate osteophytes and a small central disc protrusion. No significant spinal stenosis or nerve root impingement. L2-3: Chronic loss of disc height with annular disc bulging and endplate osteophytes, asymmetric to the right. Mild facet hypertrophy. Mild asymmetric narrowing of the right lateral recess and right foramen without nerve root impingement. No spinal canal stenosis. L3-4: Loss of disc height with annular disc bulging, endplate osteophytes and vacuum phenomenon. Mild bilateral facet hypertrophy. Resulting mild spinal stenosis with mild asymmetric right lateral recess and left foraminal narrowing. L4-5: Chronic loss of disc height with annular disc bulging and endplate osteophytes asymmetric to the left. Moderate facet and ligamentous hypertrophy. Resulting moderate multifactorial spinal stenosis with asymmetric narrowing of the left lateral recess and left foramen. L5-S1: Chronic loss  of disc height with annular disc bulging and endplate osteophytes. Mild bilateral facet hypertrophy. No significant spinal stenosis. Mild to moderate chronic foraminal narrowing bilaterally. IMPRESSION: 1. Mild superior endplate compression fractures at T12 and L1 are new from 04/26/2023 abdominal CT and with mild associated marrow edema, likely subacute. 2. Mild compression fractures involving the inferior endplate of L3 and superior endplate of L4 with associated bone marrow edema, but no significant loss of vertebral body height. These are likely acute. 3. Mild healed superior endplate compression deformity at T11. 4. Multilevel spondylosis with resulting mild spinal stenosis at L3-4 and moderate multifactorial spinal stenosis at L4-5. There is asymmetric narrowing of the left lateral recess and left foramen at L4-5 which could contribute to left L4 and/or L5 radiculopathy. 5. Mild to moderate chronic foraminal narrowing bilaterally at L5-S1. 6. Cholelithiasis. Electronically Signed   By: Elsie Perone M.D.   On: 01/16/2024 13:16   CT Hip Left Wo Contrast Result Date: 01/15/2024 EXAM: CT OF THE LEFT HIP WITHOUT IV CONTRAST 01/15/2024 01: 42:46 PM TECHNIQUE: CT of the left hip was performed without the administration of intravenous contrast. Multiplanar reformatted images are provided for review. Automated exposure control, iterative reconstruction, and/or weight based adjustment of the mA/kV was utilized to reduce the radiation dose to as low as reasonably achievable. COMPARISON: None available. CLINICAL HISTORY: Hip pain, stress fracture suspected. FINDINGS: BONES: No acute fracture or dislocation. No aggressive appearing osseous abnormality or periostitis. Moderate craniocaudad and axial loss of articular cartilage thickness in the left hip with associated spurring of the left femoral head and acetabulum compatible with moderate degenerative hip arthropathy. SOFT TISSUE: Systemic atherosclerosis is  present, including the aorta and iliac arteries. Sigmoid colon diverticulosis. Accentuated fluid density in the vaginal vault, significance uncertain. Correlate with any related symptoms. JOINT: Upper normal amount of fluid in the left hip joint. INTRAPELVIC CONTENTS: Limited images of the intrapelvic contents demonstrate sigmoid colon diverticulosis and accentuated fluid density in the vaginal vault, significance uncertain. Correlate with any related symptoms. IMPRESSION: 1. No acute fracture or bony injury. MRI can provide greater sensitivity for stress injuries and bone bruising. 2. Moderate left hip degenerative arthropathy with cartilage thinning and osteophytes. 3. Upper normal volume left hip joint fluid. 4. Systemic atherosclerosis involving the aorta and iliac arteries. 5. Sigmoid colon diverticulosis. 6. Accentuated fluid density in the vaginal vault; significance uncertain. Electronically signed by: Ryan Salvage MD 01/15/2024 02:03 PM EDT RP Workstation: [id number]   CT Lumbar Spine Wo Contrast Result Date: 01/15/2024 EXAM: CT OF THE LUMBAR SPINE WITHOUT CONTRAST 01/15/2024 01:42:46 PM TECHNIQUE: CT of the lumbar spine was performed without the administration of  intravenous contrast. Multiplanar reformatted images are provided for review. Automated exposure control, iterative reconstruction, and/or weight based adjustment of the mA/kV was utilized to reduce the radiation dose to as low as reasonably achievable. COMPARISON: None available. CLINICAL HISTORY: Low back pain. FINDINGS: BONES AND ALIGNMENT: Interval 35 percent anterior compression fracture at T12. Interval 25 percent supraband plate compression fracture at L1. No significant bony retropulsion. Compression fracture likely subacute. Diffuse bony demineralization. Levoconvex lumbar scoliosis with rotary component. DEGENERATIVE CHANGES: Prominent loss of intervertebral disc space with vacuum disc phenomenon at all lumbar levels compatible  with degenerative disc disease. Moderate left foraminal stenosis at L3-4, L4-5, and L5-S1 due to disc osteophyte complex and facet arthropathy. Moderate right foraminal stenosis at L5-S1 due to disc osteophyte complex and facet arthropathy. Moderate central stenosis at L4-5 due to disc bulge and facet arthropathy. SOFT TISSUES: Moderate sized hiatal hernia. Systemic atherosclerosis is present, including the aorta and iliac arteries. Sigmoid colon diverticulosis. IMPRESSION: 1. Interval 35% anterior compression fracture at T12 and 25% superior endplate compression fracture at L1, likely subacute, without significant bony retropulsion. 2. Moderate left foraminal stenosis at L3-4, L4-5, and L5-S1; moderate right foraminal stenosis at L5-S1; and moderate central stenosis at L4-5. 3. Prominent multilevel degenerative disc disease with vacuum disc phenomenon at all lumbar levels. 4. Levoconvex lumbar scoliosis with rotary component. Electronically signed by: Ryan Salvage MD 01/15/2024 01:58 PM EDT RP Workstation: [id number]   DG Hip Unilat With Pelvis 2-3 Views Left Result Date: 01/15/2024 CLINICAL DATA:  Left hip pain. EXAM: DG HIP (WITH OR WITHOUT PELVIS) 2-3V LEFT COMPARISON:  None Available. FINDINGS: Bones are diffusely demineralized. SI joints and symphysis pubis unremarkable. No evidence for an acute pubic ramus fracture. AP and cross-table lateral views of the left hip show no evidence for dislocation. No femoral neck fracture evident. IMPRESSION: 1. No acute bony findings. 2. Diffuse bony demineralization. Electronically Signed   By: Camellia Candle M.D.   On: 01/15/2024 12:57    Microbiology: Results for orders placed or performed in visit on 05/15/23  Microscopic Examination     Status: Abnormal   Collection Time: 05/15/23  2:39 PM   Urine  Result Value Ref Range Status   WBC, UA >30 (A) 0 - 5 /hpf Final   RBC, Urine 3-10 (A) 0 - 2 /hpf Final   Epithelial Cells (non renal) 0-10 0 - 10 /hpf  Final   Bacteria, UA Many (A) None seen/Few Final  CULTURE, URINE COMPREHENSIVE     Status: Abnormal   Collection Time: 05/15/23  4:10 PM   Specimen: Urine   UR  Result Value Ref Range Status   Urine Culture, Comprehensive Final report (A)  Final   Organism ID, Bacteria Escherichia coli (A)  Final    Comment: Cefazolin  with an MIC <=16 predicts susceptibility to the oral agents cefaclor, cefdinir, cefpodoxime, cefprozil, cefuroxime, cephalexin , and loracarbef when used for therapy of uncomplicated urinary tract infections due to E. coli, Klebsiella pneumoniae, and Proteus mirabilis. Greater than 100,000 colony forming units per mL    ANTIMICROBIAL SUSCEPTIBILITY Comment  Final    Comment:       ** S = Susceptible; I = Intermediate; R = Resistant **                    P = Positive; N = Negative             MICS are expressed in micrograms per mL    Antibiotic  RSLT#1    RSLT#2    RSLT#3    RSLT#4 Amoxicillin /Clavulanic Acid    S Ampicillin                     R Cefazolin                       S Cefepime                       S Cefoxitin                      S Cefpodoxime                    S Ceftriaxone                     S Ciprofloxacin                  R Ertapenem                      S Gentamicin                     S Levofloxacin                   R Meropenem                      S Nitrofurantoin                 S Piperacillin/Tazobactam        S Tetracycline                   S Tobramycin                     S Trimethoprim /Sulfa              S     Labs: CBC: Recent Labs  Lab 01/15/24 1317 01/15/24 1622 01/16/24 0244  WBC 9.1 10.2 10.6*  HGB 10.3* 11.0* 9.3*  HCT 32.4* 34.2* 30.8*  MCV 94.2 93.2 97.5  PLT 236 283 225   Basic Metabolic Panel: Recent Labs  Lab 01/15/24 1317 01/16/24 0244  NA 138 143  K 4.5 4.1  CL 101 105  CO2 25 23  GLUCOSE 124* 84  BUN 17 15  CREATININE 1.06* 0.97  CALCIUM  9.4 8.8*   Liver Function Tests: Recent  Labs  Lab 01/15/24 1317  AST 26  ALT 13  ALKPHOS 92  BILITOT 0.6  PROT 7.0  ALBUMIN 3.7   CBG: No results for input(s): GLUCAP in the last 168 hours.  Discharge time spent: greater than 30 minutes.  This record has been created using Conservation officer, historic buildings. Errors have been sought and corrected,but may not always be located. Such creation errors do not reflect on the standard of care.   Signed: Amaryllis Dare, MD [facility] 01/20/2024

## 2024-01-21 NOTE — Progress Notes (Unsigned)
 Referring Physician:  Gareth Mliss FALCON, FNP [address] [place],  [place] [address]  Primary Physician:  Gareth Mliss FALCON, FNP  History of Present Illness: 01/27/24 [name] is here today for follow-up after being seen in the hospital by Dr. Claudene on 01/16/2024.  She is having significant left lower extremity pain starting in the lateral hip and radiating into her knee.  She tries to ambulate she has a significant amount of pain and has been having to take tramadol .  She does have mild superior endplate compression fractures at T12 and L1 that are likely subacute as well as mild compression fractures involving the inferior endplate of L3 and superior endplate of L4.  Bowel/Bladder Dysfunction: none  Conservative measures:  Physical therapy: Participating in PT @[facility]. Multimodal medical therapy including regular antiinflammatories: gabapentin , tramadol , prednisone , acetaminophen  Injections: No epidural steroid injections.    The symptoms are causing a significant impact on the patient's life.   Review of Systems:  A 10 point review of systems is negative, except for the pertinent positives and negatives detailed in the HPI.  Past Medical History: Past Medical History:  Diagnosis Date   Colon cancer, ascending (HCC)    hx of   Hx of breast cancer    Hypertension    Hypothyroidism     Past Surgical History: Past Surgical History:  Procedure Laterality Date   BREAST EXCISIONAL BIOPSY Right    COLONOSCOPY N/A 12/11/2012   Procedure: COLONOSCOPY;  Surgeon: Claudis RAYMOND Rivet, MD;  [facility];  Service: Endoscopy;  Laterality: N/A;  1030   COLONOSCOPY N/A 07/09/2017   Procedure: COLONOSCOPY;  Surgeon: Mavis Anes, MD;  [facility];  Service: Gastroenterology;  Laterality: N/A;   MASTECTOMY Left    RIGHT COLECTOMY      Allergies: Allergies as of 01/27/2024   (No Known Allergies)    Medications: Outpatient  Encounter Medications as of 01/27/2024  Medication Sig   acetaminophen  (TYLENOL ) 325 MG tablet Take 2 tablets (650 mg total) by mouth every 6 (six) hours as needed for mild pain (pain score 1-3), fever or headache (or Fever >/= 101).   bisacodyl (DULCOLAX) 5 MG EC tablet Take 1 tablet (5 mg total) by mouth daily as needed for moderate constipation.   CRANBERRY PO Take by mouth daily.   cyanocobalamin  (VITAMIN B12) 1000 MCG tablet Take 1,000 mcg by mouth daily.   D-MANNOSE PO Take by mouth daily.   estradiol  (ESTRACE ) 0.1 MG/GM vaginal cream Estrogen Cream Instruction Discard applicator Apply pea sized amount to tip of finger to urethra before bed. Wash hands well after application. Use Monday, Wednesday and Friday   fluticasone  (FLONASE ) 50 MCG/ACT nasal spray Place 2 sprays into both nostrils daily.   folic acid  (FOLVITE ) 1 MG tablet Take 1 tablet (1 mg total) by mouth daily.   golimumab  (SIMPONI  ARIA) 50 MG/4ML SOLN injection Every 8 weeks   levothyroxine  (SYNTHROID ) 75 MCG tablet Take 1 tablet (75 mcg total) by mouth daily.   lidocaine  (LIDODERM ) 5 % Place 2 patches onto the skin daily. Remove & Discard patch within 12 hours or as directed by MD   lisinopril  (ZESTRIL ) 5 MG tablet Take 1 tablet daily   methotrexate  2.5 MG tablet Take by mouth See admin instructions. Take 6 tablets weekly on the same day. Sunday   omeprazole  (PRILOSEC) 40 MG capsule Take 1 capsule (40 mg total) by mouth daily.   ondansetron  (ZOFRAN ) 4 MG tablet Take  1 tablet (4 mg total) by mouth every 6 (six) hours as needed for nausea.   predniSONE  (DELTASONE ) 10 MG tablet Take 1 tablet (10 mg total) by mouth daily with breakfast.   Probiotic Product (PROBIOTIC DAILY PO) Take by mouth daily.   traMADol  (ULTRAM ) 50 MG tablet Take 1 tablet (50 mg total) by mouth every 12 (twelve) hours as needed for severe pain (pain score 7-10) or moderate pain (pain score 4-6).   Vitamin D , Ergocalciferol , (DRISDOL ) 1.25 MG (50000 UNIT) CAPS  capsule Take 1 capsule (50,000 Units total) by mouth once a week. (Patient taking differently: Take 50,000 Units by mouth once a week. On Wednesday)   [DISCONTINUED] Multiple Vitamins-Minerals (PRESERVISION AREDS PO) Take by mouth. (Patient not taking: Reported on 01/15/2024)   Facility-Administered Encounter Medications as of 01/27/2024  Medication   denosumab (PROLIA) injection 60 mg    Social History: Social History   Tobacco Use   Smoking status: Never   Smokeless tobacco: Never  Vaping Use   Vaping status: Never Used  Substance Use Topics   Alcohol  use: No   Drug use: No    Family Medical History: Family History  Problem Relation Age of Onset   Colon cancer Neg Hx    Breast cancer Neg Hx     Physical Examination: @VITALWITHPAIN @  General: Patient is well developed, well nourished, calm, collected, and in no apparent distress. Attention to examination is appropriate.  Psychiatric: Patient is non-anxious.  Head:  Pupils equal, round, and reactive to light.  ENT:  Oral mucosa appears well hydrated.  Neck:   Supple.    Respiratory: Patient is breathing without any difficulty.  Extremities: No edema.  Vascular: Palpable dorsal pedal pulses.  Skin:   On exposed skin, there are no abnormal skin lesions.  NEUROLOGICAL:     Awake, alert, oriented to person, place, and time.  Speech is clear and fluent. Fund of knowledge is appropriate.   Cranial Nerves: Pupils equal round and reactive to light.  Facial tone is symmetric.  Facial sensation is symmetric.  ROM of spine: Patient with little to no tenderness ovation of lumbar spine.  Patient has significant tenderness palpation of left trochanteric bursa.  Significant pain with internal and external rotation of the left hip  Strength:  Side Iliopsoas Quads Hamstring PF DF EHL  R 5 5 5 5 5 5   L 4 5 5 5 5 5    Patient has tremendous difficulty ambulating at this time secondary to her pain.  Medical Decision  Making  Imaging: EXAM: MRI LUMBAR SPINE WITHOUT CONTRAST   TECHNIQUE: Multiplanar, multisequence MR imaging of the lumbar spine was performed. No intravenous contrast was administered.   COMPARISON:  CT lumbar spine 01/15/2024. Abdominopelvic CT 04/26/2023.   FINDINGS: Segmentation:  There are 5 lumbar type vertebral bodies.   Alignment: Stable alignment with a mild scoliosis and minimal retrolisthesis at L1-2.   Vertebrae: As seen on recent CT, there are mild superior endplate compression deformities at T12 and L1 with associated low level marrow edema. These are new from 04/26/2023 abdominal CT and likely subacute. There is also a mild superior endplate compression fracture at T11 which is new, but without associated marrow edema. Additionally, there are suspected mild compression fractures involving the inferior endplate of L3 and the superior endplate of L4 with associated bone marrow edema, but no significant loss of vertebral body height. No evidence of discitis, osteomyelitis or pars defect.   Conus medullaris and cauda equina: Conus extends  to the L1-2 level. Conus and cauda equina appear normal.   Paraspinal and other soft tissues: No significant paraspinal findings. Incompletely visualized gallbladder appears distended with a dependent stone.   Disc levels:   L1-2: Chronic loss of disc height with disc bulging, endplate osteophytes and a small central disc protrusion. No significant spinal stenosis or nerve root impingement.   L2-3: Chronic loss of disc height with annular disc bulging and endplate osteophytes, asymmetric to the right. Mild facet hypertrophy. Mild asymmetric narrowing of the right lateral recess and right foramen without nerve root impingement. No spinal canal stenosis.   L3-4: Loss of disc height with annular disc bulging, endplate osteophytes and vacuum phenomenon. Mild bilateral facet hypertrophy. Resulting mild spinal stenosis with  mild asymmetric right lateral recess and left foraminal narrowing.   L4-5: Chronic loss of disc height with annular disc bulging and endplate osteophytes asymmetric to the left. Moderate facet and ligamentous hypertrophy. Resulting moderate multifactorial spinal stenosis with asymmetric narrowing of the left lateral recess and left foramen.   L5-S1: Chronic loss of disc height with annular disc bulging and endplate osteophytes. Mild bilateral facet hypertrophy. No significant spinal stenosis. Mild to moderate chronic foraminal narrowing bilaterally.   IMPRESSION: 1. Mild superior endplate compression fractures at T12 and L1 are new from 04/26/2023 abdominal CT and with mild associated marrow edema, likely subacute. 2. Mild compression fractures involving the inferior endplate of L3 and superior endplate of L4 with associated bone marrow edema, but no significant loss of vertebral body height. These are likely acute. 3. Mild healed superior endplate compression deformity at T11. 4. Multilevel spondylosis with resulting mild spinal stenosis at L3-4 and moderate multifactorial spinal stenosis at L4-5. There is asymmetric narrowing of the left lateral recess and left foramen at L4-5 which could contribute to left L4 and/or L5 radiculopathy. 5. Mild to moderate chronic foraminal narrowing bilaterally at L5-S1. 6. Cholelithiasis.      I have personally reviewed the images and agree with the above interpretation.  Assessment and Plan: Ms. Kiper is a pleasant 89 y.o. female  is here today for follow-up after being seen in the hospital by Dr. Claudene on 01/16/2024.  She is having significant left lower extremity pain starting in the lateral hip and radiating into her knee.  She does have some new compression fractures but does not seem as though that her main area of pain.  Her symptoms would be more consistent with a L1 or L2 radiculopathy but these are widely patent without active  compression on her left side seen on the MRI.  The compression fractures at T12, L1, L3, and L4 are all mild.  Would like patient to follow-up with the orthopedic team does have moderate degenerative changes that were seen on CT.  Will send a referral over to them and we will see her back in clinic in approximately 6 weeks.  If compression fractures become worse or she starts having significant pain in her back would consider kyphoplasty in the future.  She does not want to wear her TLSO brace at this time.   Thank you for involving me in the care of this patient.    Lyle Decamp, PA-C Dept. of Neurosurgery

## 2024-01-22 DIAGNOSIS — I129 Hypertensive chronic kidney disease with stage 1 through stage 4 chronic kidney disease, or unspecified chronic kidney disease: Secondary | ICD-10-CM | POA: Diagnosis not present

## 2024-01-22 DIAGNOSIS — M1612 Unilateral primary osteoarthritis, left hip: Secondary | ICD-10-CM | POA: Diagnosis not present

## 2024-01-22 DIAGNOSIS — E039 Hypothyroidism, unspecified: Secondary | ICD-10-CM | POA: Diagnosis not present

## 2024-01-22 DIAGNOSIS — M5416 Radiculopathy, lumbar region: Secondary | ICD-10-CM | POA: Diagnosis not present

## 2024-01-22 DIAGNOSIS — N1831 Chronic kidney disease, stage 3a: Secondary | ICD-10-CM | POA: Diagnosis not present

## 2024-01-22 DIAGNOSIS — M069 Rheumatoid arthritis, unspecified: Secondary | ICD-10-CM | POA: Diagnosis not present

## 2024-01-22 DIAGNOSIS — K219 Gastro-esophageal reflux disease without esophagitis: Secondary | ICD-10-CM | POA: Diagnosis not present

## 2024-01-22 DIAGNOSIS — G8929 Other chronic pain: Secondary | ICD-10-CM | POA: Diagnosis not present

## 2024-01-22 DIAGNOSIS — S32000A Wedge compression fracture of unspecified lumbar vertebra, initial encounter for closed fracture: Secondary | ICD-10-CM | POA: Diagnosis not present

## 2024-01-24 DIAGNOSIS — K219 Gastro-esophageal reflux disease without esophagitis: Secondary | ICD-10-CM | POA: Diagnosis not present

## 2024-01-24 DIAGNOSIS — M069 Rheumatoid arthritis, unspecified: Secondary | ICD-10-CM | POA: Diagnosis not present

## 2024-01-24 DIAGNOSIS — M1612 Unilateral primary osteoarthritis, left hip: Secondary | ICD-10-CM | POA: Diagnosis not present

## 2024-01-24 DIAGNOSIS — N1831 Chronic kidney disease, stage 3a: Secondary | ICD-10-CM | POA: Diagnosis not present

## 2024-01-24 DIAGNOSIS — M5416 Radiculopathy, lumbar region: Secondary | ICD-10-CM | POA: Diagnosis not present

## 2024-01-24 DIAGNOSIS — I129 Hypertensive chronic kidney disease with stage 1 through stage 4 chronic kidney disease, or unspecified chronic kidney disease: Secondary | ICD-10-CM | POA: Diagnosis not present

## 2024-01-24 DIAGNOSIS — E039 Hypothyroidism, unspecified: Secondary | ICD-10-CM | POA: Diagnosis not present

## 2024-01-24 DIAGNOSIS — G8929 Other chronic pain: Secondary | ICD-10-CM | POA: Diagnosis not present

## 2024-01-24 DIAGNOSIS — S32000A Wedge compression fracture of unspecified lumbar vertebra, initial encounter for closed fracture: Secondary | ICD-10-CM | POA: Diagnosis not present

## 2024-01-27 ENCOUNTER — Ambulatory Visit: Admitting: Physician Assistant

## 2024-01-27 VITALS — BP 132/72 | Wt 147.0 lb

## 2024-01-27 DIAGNOSIS — M810 Age-related osteoporosis without current pathological fracture: Secondary | ICD-10-CM | POA: Diagnosis not present

## 2024-01-27 DIAGNOSIS — S32010D Wedge compression fracture of first lumbar vertebra, subsequent encounter for fracture with routine healing: Secondary | ICD-10-CM

## 2024-01-27 DIAGNOSIS — S32039D Unspecified fracture of third lumbar vertebra, subsequent encounter for fracture with routine healing: Secondary | ICD-10-CM

## 2024-01-27 DIAGNOSIS — I10 Essential (primary) hypertension: Secondary | ICD-10-CM | POA: Diagnosis not present

## 2024-01-27 DIAGNOSIS — M1612 Unilateral primary osteoarthritis, left hip: Secondary | ICD-10-CM | POA: Diagnosis not present

## 2024-01-27 DIAGNOSIS — S32040D Wedge compression fracture of fourth lumbar vertebra, subsequent encounter for fracture with routine healing: Secondary | ICD-10-CM | POA: Diagnosis not present

## 2024-01-27 DIAGNOSIS — M069 Rheumatoid arthritis, unspecified: Secondary | ICD-10-CM | POA: Diagnosis not present

## 2024-01-27 DIAGNOSIS — S22080D Wedge compression fracture of T11-T12 vertebra, subsequent encounter for fracture with routine healing: Secondary | ICD-10-CM | POA: Diagnosis not present

## 2024-01-27 DIAGNOSIS — K219 Gastro-esophageal reflux disease without esophagitis: Secondary | ICD-10-CM | POA: Diagnosis not present

## 2024-01-27 DIAGNOSIS — S32030D Wedge compression fracture of third lumbar vertebra, subsequent encounter for fracture with routine healing: Secondary | ICD-10-CM

## 2024-01-27 DIAGNOSIS — D649 Anemia, unspecified: Secondary | ICD-10-CM | POA: Diagnosis not present

## 2024-01-27 DIAGNOSIS — M25552 Pain in left hip: Secondary | ICD-10-CM | POA: Diagnosis not present

## 2024-01-27 DIAGNOSIS — E039 Hypothyroidism, unspecified: Secondary | ICD-10-CM | POA: Diagnosis not present

## 2024-01-27 DIAGNOSIS — N1832 Chronic kidney disease, stage 3b: Secondary | ICD-10-CM | POA: Diagnosis not present

## 2024-01-27 DIAGNOSIS — M4856XA Collapsed vertebra, not elsewhere classified, lumbar region, initial encounter for fracture: Secondary | ICD-10-CM | POA: Diagnosis not present

## 2024-01-28 NOTE — Patient Instructions (Signed)
 SABRA

## 2024-01-29 DIAGNOSIS — M1612 Unilateral primary osteoarthritis, left hip: Secondary | ICD-10-CM | POA: Diagnosis not present

## 2024-01-29 DIAGNOSIS — I129 Hypertensive chronic kidney disease with stage 1 through stage 4 chronic kidney disease, or unspecified chronic kidney disease: Secondary | ICD-10-CM | POA: Diagnosis not present

## 2024-01-29 DIAGNOSIS — E039 Hypothyroidism, unspecified: Secondary | ICD-10-CM | POA: Diagnosis not present

## 2024-01-29 DIAGNOSIS — M5416 Radiculopathy, lumbar region: Secondary | ICD-10-CM | POA: Diagnosis not present

## 2024-01-29 DIAGNOSIS — K5900 Constipation, unspecified: Secondary | ICD-10-CM | POA: Diagnosis not present

## 2024-01-29 DIAGNOSIS — K219 Gastro-esophageal reflux disease without esophagitis: Secondary | ICD-10-CM | POA: Diagnosis not present

## 2024-01-29 DIAGNOSIS — N1831 Chronic kidney disease, stage 3a: Secondary | ICD-10-CM | POA: Diagnosis not present

## 2024-01-29 DIAGNOSIS — M069 Rheumatoid arthritis, unspecified: Secondary | ICD-10-CM | POA: Diagnosis not present

## 2024-01-31 DIAGNOSIS — E039 Hypothyroidism, unspecified: Secondary | ICD-10-CM | POA: Diagnosis not present

## 2024-01-31 DIAGNOSIS — M069 Rheumatoid arthritis, unspecified: Secondary | ICD-10-CM | POA: Diagnosis not present

## 2024-01-31 DIAGNOSIS — K219 Gastro-esophageal reflux disease without esophagitis: Secondary | ICD-10-CM | POA: Diagnosis not present

## 2024-01-31 DIAGNOSIS — Z7952 Long term (current) use of systemic steroids: Secondary | ICD-10-CM | POA: Diagnosis not present

## 2024-01-31 DIAGNOSIS — M5416 Radiculopathy, lumbar region: Secondary | ICD-10-CM | POA: Diagnosis not present

## 2024-01-31 DIAGNOSIS — M1612 Unilateral primary osteoarthritis, left hip: Secondary | ICD-10-CM | POA: Diagnosis not present

## 2024-01-31 DIAGNOSIS — I129 Hypertensive chronic kidney disease with stage 1 through stage 4 chronic kidney disease, or unspecified chronic kidney disease: Secondary | ICD-10-CM | POA: Diagnosis not present

## 2024-01-31 DIAGNOSIS — N1831 Chronic kidney disease, stage 3a: Secondary | ICD-10-CM | POA: Diagnosis not present

## 2024-02-03 ENCOUNTER — Encounter: Payer: Self-pay | Admitting: Radiology

## 2024-02-03 ENCOUNTER — Telehealth: Payer: Self-pay | Admitting: Nurse Practitioner

## 2024-02-03 DIAGNOSIS — M069 Rheumatoid arthritis, unspecified: Secondary | ICD-10-CM | POA: Diagnosis not present

## 2024-02-03 DIAGNOSIS — I129 Hypertensive chronic kidney disease with stage 1 through stage 4 chronic kidney disease, or unspecified chronic kidney disease: Secondary | ICD-10-CM | POA: Diagnosis not present

## 2024-02-03 DIAGNOSIS — M1612 Unilateral primary osteoarthritis, left hip: Secondary | ICD-10-CM | POA: Diagnosis not present

## 2024-02-03 DIAGNOSIS — K219 Gastro-esophageal reflux disease without esophagitis: Secondary | ICD-10-CM | POA: Diagnosis not present

## 2024-02-03 DIAGNOSIS — M5416 Radiculopathy, lumbar region: Secondary | ICD-10-CM | POA: Diagnosis not present

## 2024-02-03 DIAGNOSIS — Z7952 Long term (current) use of systemic steroids: Secondary | ICD-10-CM | POA: Diagnosis not present

## 2024-02-03 DIAGNOSIS — N1831 Chronic kidney disease, stage 3a: Secondary | ICD-10-CM | POA: Diagnosis not present

## 2024-02-03 DIAGNOSIS — K449 Diaphragmatic hernia without obstruction or gangrene: Secondary | ICD-10-CM | POA: Diagnosis not present

## 2024-02-03 DIAGNOSIS — E039 Hypothyroidism, unspecified: Secondary | ICD-10-CM | POA: Diagnosis not present

## 2024-02-03 NOTE — Telephone Encounter (Signed)
 Copied from CRM #[id number]. Topic: General - Other >> Feb 03, 2024  4:28 PM Everette C wrote: Reason for CRM: Samantha with [employer] has called to request document submission of any office notes related to the patient's mastectomy   Please fax to [phone] Attn Lucie Lucie can be reached by phone at [phone]

## 2024-02-05 DIAGNOSIS — K449 Diaphragmatic hernia without obstruction or gangrene: Secondary | ICD-10-CM | POA: Diagnosis not present

## 2024-02-05 DIAGNOSIS — E039 Hypothyroidism, unspecified: Secondary | ICD-10-CM | POA: Diagnosis not present

## 2024-02-05 DIAGNOSIS — M069 Rheumatoid arthritis, unspecified: Secondary | ICD-10-CM | POA: Diagnosis not present

## 2024-02-05 DIAGNOSIS — K5900 Constipation, unspecified: Secondary | ICD-10-CM | POA: Diagnosis not present

## 2024-02-05 DIAGNOSIS — M1612 Unilateral primary osteoarthritis, left hip: Secondary | ICD-10-CM | POA: Diagnosis not present

## 2024-02-05 DIAGNOSIS — M5416 Radiculopathy, lumbar region: Secondary | ICD-10-CM | POA: Diagnosis not present

## 2024-02-05 DIAGNOSIS — Z7952 Long term (current) use of systemic steroids: Secondary | ICD-10-CM | POA: Diagnosis not present

## 2024-02-05 DIAGNOSIS — N1831 Chronic kidney disease, stage 3a: Secondary | ICD-10-CM | POA: Diagnosis not present

## 2024-02-05 DIAGNOSIS — I129 Hypertensive chronic kidney disease with stage 1 through stage 4 chronic kidney disease, or unspecified chronic kidney disease: Secondary | ICD-10-CM | POA: Diagnosis not present

## 2024-02-05 DIAGNOSIS — K219 Gastro-esophageal reflux disease without esophagitis: Secondary | ICD-10-CM | POA: Diagnosis not present

## 2024-02-05 NOTE — Telephone Encounter (Signed)
 Pt has appt. For 11/10 will discuss then and send notes

## 2024-02-10 ENCOUNTER — Ambulatory Visit: Admitting: Nurse Practitioner

## 2024-02-10 DIAGNOSIS — E039 Hypothyroidism, unspecified: Secondary | ICD-10-CM | POA: Diagnosis not present

## 2024-02-10 DIAGNOSIS — Z7952 Long term (current) use of systemic steroids: Secondary | ICD-10-CM | POA: Diagnosis not present

## 2024-02-10 DIAGNOSIS — I129 Hypertensive chronic kidney disease with stage 1 through stage 4 chronic kidney disease, or unspecified chronic kidney disease: Secondary | ICD-10-CM | POA: Diagnosis not present

## 2024-02-10 DIAGNOSIS — M5416 Radiculopathy, lumbar region: Secondary | ICD-10-CM | POA: Diagnosis not present

## 2024-02-10 DIAGNOSIS — R197 Diarrhea, unspecified: Secondary | ICD-10-CM | POA: Diagnosis not present

## 2024-02-10 DIAGNOSIS — N1831 Chronic kidney disease, stage 3a: Secondary | ICD-10-CM | POA: Diagnosis not present

## 2024-02-10 DIAGNOSIS — K449 Diaphragmatic hernia without obstruction or gangrene: Secondary | ICD-10-CM | POA: Diagnosis not present

## 2024-02-10 DIAGNOSIS — M069 Rheumatoid arthritis, unspecified: Secondary | ICD-10-CM | POA: Diagnosis not present

## 2024-02-10 DIAGNOSIS — M1612 Unilateral primary osteoarthritis, left hip: Secondary | ICD-10-CM | POA: Diagnosis not present

## 2024-02-10 DIAGNOSIS — K219 Gastro-esophageal reflux disease without esophagitis: Secondary | ICD-10-CM | POA: Diagnosis not present

## 2024-02-10 NOTE — Telephone Encounter (Signed)
 Called daughter disregard is moving into assisted living

## 2024-02-13 DIAGNOSIS — M150 Primary generalized (osteo)arthritis: Secondary | ICD-10-CM | POA: Diagnosis not present

## 2024-02-13 DIAGNOSIS — M0609 Rheumatoid arthritis without rheumatoid factor, multiple sites: Secondary | ICD-10-CM | POA: Diagnosis not present

## 2024-02-13 DIAGNOSIS — M810 Age-related osteoporosis without current pathological fracture: Secondary | ICD-10-CM | POA: Diagnosis not present

## 2024-02-17 DIAGNOSIS — K449 Diaphragmatic hernia without obstruction or gangrene: Secondary | ICD-10-CM | POA: Diagnosis not present

## 2024-02-17 DIAGNOSIS — Z66 Do not resuscitate: Secondary | ICD-10-CM | POA: Diagnosis not present

## 2024-02-17 DIAGNOSIS — K8020 Calculus of gallbladder without cholecystitis without obstruction: Secondary | ICD-10-CM | POA: Diagnosis not present

## 2024-02-17 DIAGNOSIS — E538 Deficiency of other specified B group vitamins: Secondary | ICD-10-CM | POA: Diagnosis not present

## 2024-02-17 DIAGNOSIS — I700 Atherosclerosis of aorta: Secondary | ICD-10-CM | POA: Diagnosis not present

## 2024-02-17 DIAGNOSIS — G8929 Other chronic pain: Secondary | ICD-10-CM | POA: Diagnosis not present

## 2024-02-17 DIAGNOSIS — I708 Atherosclerosis of other arteries: Secondary | ICD-10-CM | POA: Diagnosis not present

## 2024-02-17 DIAGNOSIS — K219 Gastro-esophageal reflux disease without esophagitis: Secondary | ICD-10-CM | POA: Diagnosis not present

## 2024-02-17 DIAGNOSIS — D849 Immunodeficiency, unspecified: Secondary | ICD-10-CM | POA: Diagnosis not present

## 2024-02-17 DIAGNOSIS — E559 Vitamin D deficiency, unspecified: Secondary | ICD-10-CM | POA: Diagnosis not present

## 2024-02-17 DIAGNOSIS — I10 Essential (primary) hypertension: Secondary | ICD-10-CM | POA: Diagnosis not present

## 2024-02-17 DIAGNOSIS — J321 Chronic frontal sinusitis: Secondary | ICD-10-CM | POA: Diagnosis not present

## 2024-02-18 ENCOUNTER — Ambulatory Visit (INDEPENDENT_AMBULATORY_CARE_PROVIDER_SITE_OTHER): Admitting: Physician Assistant

## 2024-02-18 DIAGNOSIS — M810 Age-related osteoporosis without current pathological fracture: Secondary | ICD-10-CM

## 2024-02-18 MED ORDER — DENOSUMAB 60 MG/ML ~~LOC~~ SOSY: 60.0000 mg | PREFILLED_SYRINGE | Freq: Once | SUBCUTANEOUS | Status: AC

## 2024-02-18 NOTE — Progress Notes (Signed)
 Office Visit Note   Patient: Wanda Cohen           Date of Birth: June 15, 1934           MRN: [id number] Visit Date: 02/18/2024              Requested by: Gareth Mliss FALCON, FNP [address] [place],  [place] [address] PCP: Gareth Mliss FALCON, FNP  No chief complaint on file.     HPI: Patient comes in for her first Prolia injection.  Assessment & Plan: Visit Diagnoses:  1. Age-related osteoporosis without current pathological fracture     Plan: Injection completed without difficulty will follow-up in 6 months however I would like her to come back in a few weeks to recheck her VMAT to ensure that her kidney function and calcium  are staying adequate previously side effects were discussed with the patient and her family  Follow-Up Instructions: No follow-ups on file.   Ortho Exam  Patient is alert, oriented, no adenopathy, well-dressed, normal affect, normal respiratory effort.     Imaging: No results found. No images are attached to the encounter.  Labs: Lab Results  Component Value Date   ESRSEDRATE 36 (H) 01/15/2024   CRP 0.6 01/15/2024   REPTSTATUS 06/20/2021 FINAL 06/15/2021   REPTSTATUS 06/20/2021 FINAL 06/15/2021   CULT  06/15/2021    NO GROWTH 5 DAYS Performed at [facility], [address] [place], [place] [address]    CULT  06/15/2021    NO GROWTH 5 DAYS Performed at [facility], [address] [place], [place] [address]    [facility] ESCHERICHIA COLI (A) 06/15/2021     Lab Results  Component Value Date   ALBUMIN 3.7 01/15/2024   ALBUMIN 3.7 06/15/2021    No results found for: MG Lab Results  Component Value Date   VD25OH 83 08/07/2023   VD25OH 86 07/04/2022   VD25OH 97 01/01/2022    No results found for: PREALBUMIN    Latest Ref Rng & Units 01/16/2024    2:44 AM 01/15/2024    4:22 PM 01/15/2024    1:17 PM  CBC EXTENDED  WBC 4.0 - 10.5 K/uL 10.6  10.2  9.1   RBC 3.87 - 5.11 MIL/uL 3.16  3.67  3.44   Hemoglobin  12.0 - 15.0 g/dL 9.3  88.9  89.6   HCT 63.9 - 46.0 % 30.8  34.2  32.4   Platelets 150 - 400 K/uL 225  283  236      There is no height or weight on file to calculate BMI.  Orders:  No orders of the defined types were placed in this encounter.  Meds ordered this encounter  Medications   denosumab (PROLIA) injection 60 mg    Patient is enrolled in REMS program for this medication and I have provided a copy of the Prolia Medication Guide and Patient Brochure.:   No    I have reviewed with the patient the information in the Prolia Medication Guide and Patient Counseling Chart including the serious risks of Prolia and symptoms of each risk.:   Yes    I have advised the patient to seek medical attention if they have signs or symptoms of any of the serious risks.:   Yes     Procedures: No procedures performed  Clinical Data: No additional findings.  ROS:  All other systems negative, except as noted in the HPI. Review of Systems  Objective: Vital Signs: There were no vitals taken  for this visit.  Specialty Comments:  No specialty comments available.  PMFS History: Patient Active Problem List   Diagnosis Date Noted   Left hip pain 01/20/2024   Chronic midline low back pain 01/20/2024   Lumbar radiculopathy, acute 01/16/2024   Spinal compression fracture (HCC) 01/16/2024   Inadequate pain control 01/15/2024   At risk for falling 01/15/2024   Immunocompromised patient 01/15/2024   Age-related osteoporosis without current pathological fracture 08/07/2023   Hiatal hernia 08/07/2023   Gastroesophageal reflux disease without esophagitis 08/07/2023   Chronic frontal sinusitis 08/07/2023   Eustachian tube dysfunction, bilateral 08/07/2023   Personal history of breast cancer 01/01/2022   Vitamin D  deficiency 06/17/2021   Hypothyroidism    Rheumatoid arthritis (HCC) 06/15/2021   Personal history of colon cancer    Diverticulosis of large intestine without diverticulitis    Body  mass index 25.0-25.9, adult 07/18/2016   Chronic kidney disease, stage II (mild) 05/01/2016   Benign essential hypertension 04/21/2013   Pure hypercholesterolemia 10/07/2012   Past Medical History:  Diagnosis Date   Colon cancer, ascending (HCC)    hx of   Hx of breast cancer    Hypertension    Hypothyroidism     Family History  Problem Relation Age of Onset   Colon cancer Neg Hx    Breast cancer Neg Hx     Past Surgical History:  Procedure Laterality Date   BREAST EXCISIONAL BIOPSY Right    COLONOSCOPY N/A 12/11/2012   Procedure: COLONOSCOPY;  Surgeon: Claudis RAYMOND Rivet, MD;  [facility];  Service: Endoscopy;  Laterality: N/A;  1030   COLONOSCOPY N/A 07/09/2017   Procedure: COLONOSCOPY;  Surgeon: Mavis Anes, MD;  [facility];  Service: Gastroenterology;  Laterality: N/A;   MASTECTOMY Left    RIGHT COLECTOMY     Social History   Occupational History   Not on file  Tobacco Use   Smoking status: Never   Smokeless tobacco: Never  Vaping Use   Vaping status: Never Used  Substance and Sexual Activity   Alcohol  use: No   Drug use: No   Sexual activity: Not Currently

## 2024-02-19 DIAGNOSIS — E7800 Pure hypercholesterolemia, unspecified: Secondary | ICD-10-CM | POA: Diagnosis not present

## 2024-02-19 DIAGNOSIS — I10 Essential (primary) hypertension: Secondary | ICD-10-CM | POA: Diagnosis not present

## 2024-02-19 DIAGNOSIS — Z79899 Other long term (current) drug therapy: Secondary | ICD-10-CM | POA: Diagnosis not present

## 2024-02-19 DIAGNOSIS — E538 Deficiency of other specified B group vitamins: Secondary | ICD-10-CM | POA: Diagnosis not present

## 2024-02-19 DIAGNOSIS — E559 Vitamin D deficiency, unspecified: Secondary | ICD-10-CM | POA: Diagnosis not present

## 2024-02-20 ENCOUNTER — Telehealth: Payer: Self-pay | Admitting: Radiology

## 2024-02-20 DIAGNOSIS — R109 Unspecified abdominal pain: Secondary | ICD-10-CM | POA: Diagnosis not present

## 2024-02-20 NOTE — Telephone Encounter (Signed)
 I called patient to schedule three week nurse visit for labs only (BMET) per Ronal Dragon.  Patient asked that I call her son, Oneil, to schedule as he is the one who gets her to and from appointments.  I spoke with Oneil and advised we needed to make appointment to keep a check on her kidney function, etc as she has started Prolia injections. He states that patient is now residing in the assisted living facility he manages in [place] and will be following up with [facility] as they are closer.  He states that patient had labwork done at [facility] yesterday.

## 2024-02-21 DIAGNOSIS — Z79899 Other long term (current) drug therapy: Secondary | ICD-10-CM | POA: Diagnosis not present

## 2024-02-21 DIAGNOSIS — E785 Hyperlipidemia, unspecified: Secondary | ICD-10-CM | POA: Diagnosis not present

## 2024-02-21 DIAGNOSIS — K458 Other specified abdominal hernia without obstruction or gangrene: Secondary | ICD-10-CM | POA: Diagnosis not present

## 2024-02-21 DIAGNOSIS — E538 Deficiency of other specified B group vitamins: Secondary | ICD-10-CM | POA: Diagnosis not present

## 2024-02-21 DIAGNOSIS — R7303 Prediabetes: Secondary | ICD-10-CM | POA: Diagnosis not present

## 2024-02-21 DIAGNOSIS — D649 Anemia, unspecified: Secondary | ICD-10-CM | POA: Diagnosis not present

## 2024-02-21 DIAGNOSIS — E559 Vitamin D deficiency, unspecified: Secondary | ICD-10-CM | POA: Diagnosis not present

## 2024-02-25 ENCOUNTER — Telehealth: Payer: Self-pay | Admitting: Nurse Practitioner

## 2024-02-25 NOTE — Telephone Encounter (Signed)
 Anyway you can addend the aug. Note stating she has had mastectomy and needs bras

## 2024-02-25 NOTE — Telephone Encounter (Signed)
 Copied from CRM [id number]. Topic: General - Other >> Feb 25, 2024  3:09 PM Avram MATSU wrote: Reason for CRM: Lucie is calling from [facility] [employer] and stated the insurance needs more verbiage for the history of the mastectomy. Please advise [phone]

## 2024-03-02 NOTE — Telephone Encounter (Signed)
 Even though it was back in I believe aug. Patient was seen

## 2024-03-02 NOTE — Telephone Encounter (Signed)
 Even though it was back in aug. Patient was last seen, anyway we can addend stating she has had HX of breast cancer and needs new bras?

## 2024-03-02 NOTE — Telephone Encounter (Signed)
Notes and order refaxed.

## 2024-03-02 NOTE — Telephone Encounter (Signed)
 Copied from CRM [id number]. Topic: General - Other >> Feb 25, 2024  3:09 PM Avram MATSU wrote: Reason for CRM: Lucie is calling from [facility] and stated the insurance needs more verbiage for the history of the mastectomy. Please advise [phone] >> Mar 02, 2024 10:13 AM Robinson DEL wrote: Lucie with [facility] following up on note addendum for patients supplies, advised Lucie that notes were addended on 11/26 and states she doesn't have anything updated. Asking if office can send updated notes to; [facility] [phone] fax

## 2024-03-06 DIAGNOSIS — I10 Essential (primary) hypertension: Secondary | ICD-10-CM | POA: Diagnosis not present

## 2024-03-12 DIAGNOSIS — R5383 Other fatigue: Secondary | ICD-10-CM | POA: Diagnosis not present

## 2024-03-12 DIAGNOSIS — Z111 Encounter for screening for respiratory tuberculosis: Secondary | ICD-10-CM | POA: Diagnosis not present

## 2024-03-12 DIAGNOSIS — M0609 Rheumatoid arthritis without rheumatoid factor, multiple sites: Secondary | ICD-10-CM | POA: Diagnosis not present
